# Patient Record
Sex: Female | Born: 1935
Health system: Southern US, Community
[De-identification: ages and names within clinical notes are randomized; demographics above are authoritative.]

## PROBLEM LIST (undated history)

## (undated) DIAGNOSIS — I639 Cerebral infarction, unspecified: Secondary | ICD-10-CM

## (undated) DIAGNOSIS — F0390 Unspecified dementia without behavioral disturbance: Secondary | ICD-10-CM

## (undated) DIAGNOSIS — S92309A Fracture of unspecified metatarsal bone(s), unspecified foot, initial encounter for closed fracture: Secondary | ICD-10-CM

## (undated) DIAGNOSIS — I10 Essential (primary) hypertension: Secondary | ICD-10-CM

## (undated) DIAGNOSIS — I739 Peripheral vascular disease, unspecified: Secondary | ICD-10-CM

## (undated) DIAGNOSIS — IMO0001 Reserved for inherently not codable concepts without codable children: Secondary | ICD-10-CM

## (undated) DIAGNOSIS — I82409 Acute embolism and thrombosis of unspecified deep veins of unspecified lower extremity: Secondary | ICD-10-CM

## (undated) DIAGNOSIS — H269 Unspecified cataract: Secondary | ICD-10-CM

## (undated) DIAGNOSIS — I4891 Unspecified atrial fibrillation: Secondary | ICD-10-CM

## (undated) DIAGNOSIS — H9190 Unspecified hearing loss, unspecified ear: Secondary | ICD-10-CM

## (undated) DIAGNOSIS — I96 Gangrene, not elsewhere classified: Secondary | ICD-10-CM

## (undated) DIAGNOSIS — E785 Hyperlipidemia, unspecified: Secondary | ICD-10-CM

## (undated) DIAGNOSIS — N3941 Urge incontinence: Secondary | ICD-10-CM

## (undated) DIAGNOSIS — G500 Trigeminal neuralgia: Secondary | ICD-10-CM

## (undated) DIAGNOSIS — R569 Unspecified convulsions: Secondary | ICD-10-CM

## (undated) HISTORY — DX: Unspecified convulsions: R56.9

## (undated) HISTORY — DX: Acute embolism and thrombosis of unspecified deep veins of unspecified lower extremity: I82.409

## (undated) HISTORY — DX: Unspecified dementia, unspecified severity, without behavioral disturbance, psychotic disturbance, mood disturbance, and anxiety: F03.90

## (undated) HISTORY — PX: TOE SURGERY: SHX1073

## (undated) HISTORY — DX: Urge incontinence: N39.41

## (undated) HISTORY — DX: Cerebral infarction, unspecified: I63.9

## (undated) HISTORY — DX: Essential (primary) hypertension: I10

## (undated) HISTORY — DX: Hyperlipidemia, unspecified: E78.5

## (undated) HISTORY — DX: Unspecified cataract: H26.9

## (undated) HISTORY — DX: Unspecified hearing loss, unspecified ear: H91.90

## (undated) HISTORY — DX: Trigeminal neuralgia: G50.0

---

## 1998-10-23 ENCOUNTER — Encounter: Payer: Self-pay | Admitting: Emergency Medicine

## 1998-10-23 ENCOUNTER — Emergency Department (HOSPITAL_COMMUNITY): Admission: EM | Admit: 1998-10-23 | Discharge: 1998-10-23 | Payer: Self-pay | Admitting: Emergency Medicine

## 1998-11-25 ENCOUNTER — Emergency Department (HOSPITAL_COMMUNITY): Admission: EM | Admit: 1998-11-25 | Discharge: 1998-11-25 | Payer: Self-pay | Admitting: *Deleted

## 1998-12-01 ENCOUNTER — Encounter: Admission: RE | Admit: 1998-12-01 | Discharge: 1998-12-01 | Payer: Self-pay | Admitting: Family Medicine

## 1998-12-30 ENCOUNTER — Encounter: Admission: RE | Admit: 1998-12-30 | Discharge: 1998-12-30 | Payer: Self-pay | Admitting: Sports Medicine

## 1999-04-24 ENCOUNTER — Emergency Department (HOSPITAL_COMMUNITY): Admission: EM | Admit: 1999-04-24 | Discharge: 1999-04-24 | Payer: Self-pay | Admitting: Emergency Medicine

## 1999-04-24 ENCOUNTER — Encounter: Payer: Self-pay | Admitting: Emergency Medicine

## 1999-06-23 ENCOUNTER — Ambulatory Visit (HOSPITAL_COMMUNITY): Admission: RE | Admit: 1999-06-23 | Discharge: 1999-06-23 | Payer: Self-pay | Admitting: Family Medicine

## 1999-06-23 ENCOUNTER — Encounter: Admission: RE | Admit: 1999-06-23 | Discharge: 1999-06-23 | Payer: Self-pay | Admitting: Family Medicine

## 1999-07-21 ENCOUNTER — Encounter: Admission: RE | Admit: 1999-07-21 | Discharge: 1999-07-21 | Payer: Self-pay | Admitting: Family Medicine

## 1999-08-12 ENCOUNTER — Emergency Department (HOSPITAL_COMMUNITY): Admission: EM | Admit: 1999-08-12 | Discharge: 1999-08-12 | Payer: Self-pay | Admitting: Emergency Medicine

## 1999-10-12 ENCOUNTER — Encounter: Admission: RE | Admit: 1999-10-12 | Discharge: 1999-10-12 | Payer: Self-pay | Admitting: Family Medicine

## 2000-01-13 ENCOUNTER — Emergency Department (HOSPITAL_COMMUNITY): Admission: EM | Admit: 2000-01-13 | Discharge: 2000-01-13 | Payer: Self-pay | Admitting: Emergency Medicine

## 2000-01-13 ENCOUNTER — Encounter: Payer: Self-pay | Admitting: Emergency Medicine

## 2000-01-19 ENCOUNTER — Encounter: Admission: RE | Admit: 2000-01-19 | Discharge: 2000-01-19 | Payer: Self-pay | Admitting: Family Medicine

## 2000-02-03 ENCOUNTER — Encounter: Admission: RE | Admit: 2000-02-03 | Discharge: 2000-02-03 | Payer: Self-pay | Admitting: Family Medicine

## 2000-07-27 ENCOUNTER — Inpatient Hospital Stay (HOSPITAL_COMMUNITY): Admission: EM | Admit: 2000-07-27 | Discharge: 2000-07-29 | Payer: Self-pay | Admitting: Emergency Medicine

## 2001-01-18 ENCOUNTER — Encounter: Admission: RE | Admit: 2001-01-18 | Discharge: 2001-01-18 | Payer: Self-pay | Admitting: Family Medicine

## 2001-07-12 ENCOUNTER — Encounter: Admission: RE | Admit: 2001-07-12 | Discharge: 2001-07-12 | Payer: Self-pay | Admitting: Family Medicine

## 2001-08-17 ENCOUNTER — Encounter: Admission: RE | Admit: 2001-08-17 | Discharge: 2001-08-17 | Payer: Self-pay | Admitting: Family Medicine

## 2001-08-23 ENCOUNTER — Encounter: Payer: Self-pay | Admitting: Family Medicine

## 2001-08-23 ENCOUNTER — Inpatient Hospital Stay (HOSPITAL_COMMUNITY): Admission: EM | Admit: 2001-08-23 | Discharge: 2001-08-25 | Payer: Self-pay

## 2001-08-23 ENCOUNTER — Encounter: Payer: Self-pay | Admitting: Emergency Medicine

## 2001-10-05 ENCOUNTER — Encounter: Admission: RE | Admit: 2001-10-05 | Discharge: 2001-10-05 | Payer: Self-pay | Admitting: Family Medicine

## 2001-12-19 ENCOUNTER — Encounter: Admission: RE | Admit: 2001-12-19 | Discharge: 2001-12-19 | Payer: Self-pay | Admitting: Family Medicine

## 2001-12-28 ENCOUNTER — Encounter: Admission: RE | Admit: 2001-12-28 | Discharge: 2001-12-28 | Payer: Self-pay | Admitting: Family Medicine

## 2002-01-22 ENCOUNTER — Encounter: Admission: RE | Admit: 2002-01-22 | Discharge: 2002-01-22 | Payer: Self-pay | Admitting: Family Medicine

## 2002-06-26 ENCOUNTER — Encounter: Admission: RE | Admit: 2002-06-26 | Discharge: 2002-06-26 | Payer: Self-pay | Admitting: Family Medicine

## 2002-07-25 ENCOUNTER — Encounter: Admission: RE | Admit: 2002-07-25 | Discharge: 2002-07-25 | Payer: Self-pay | Admitting: Family Medicine

## 2002-09-12 ENCOUNTER — Encounter: Admission: RE | Admit: 2002-09-12 | Discharge: 2002-09-12 | Payer: Self-pay | Admitting: Family Medicine

## 2002-10-05 ENCOUNTER — Encounter: Admission: RE | Admit: 2002-10-05 | Discharge: 2002-10-05 | Payer: Self-pay | Admitting: Family Medicine

## 2002-11-07 ENCOUNTER — Encounter: Admission: RE | Admit: 2002-11-07 | Discharge: 2002-11-07 | Payer: Self-pay | Admitting: Orthopedic Surgery

## 2002-11-07 ENCOUNTER — Encounter: Payer: Self-pay | Admitting: Orthopedic Surgery

## 2002-11-08 ENCOUNTER — Encounter (INDEPENDENT_AMBULATORY_CARE_PROVIDER_SITE_OTHER): Payer: Self-pay | Admitting: *Deleted

## 2002-11-08 ENCOUNTER — Ambulatory Visit (HOSPITAL_BASED_OUTPATIENT_CLINIC_OR_DEPARTMENT_OTHER): Admission: RE | Admit: 2002-11-08 | Discharge: 2002-11-08 | Payer: Self-pay | Admitting: Orthopedic Surgery

## 2002-12-20 ENCOUNTER — Encounter: Admission: RE | Admit: 2002-12-20 | Discharge: 2002-12-20 | Payer: Self-pay | Admitting: Sports Medicine

## 2003-02-11 ENCOUNTER — Encounter: Admission: RE | Admit: 2003-02-11 | Discharge: 2003-02-11 | Payer: Self-pay | Admitting: Sports Medicine

## 2003-02-20 ENCOUNTER — Encounter: Admission: RE | Admit: 2003-02-20 | Discharge: 2003-02-20 | Payer: Self-pay | Admitting: Sports Medicine

## 2003-02-20 ENCOUNTER — Encounter: Payer: Self-pay | Admitting: Sports Medicine

## 2003-02-25 ENCOUNTER — Encounter: Admission: RE | Admit: 2003-02-25 | Discharge: 2003-02-25 | Payer: Self-pay | Admitting: Family Medicine

## 2003-07-12 ENCOUNTER — Encounter: Admission: RE | Admit: 2003-07-12 | Discharge: 2003-07-12 | Payer: Self-pay | Admitting: Family Medicine

## 2003-07-24 ENCOUNTER — Encounter: Admission: RE | Admit: 2003-07-24 | Discharge: 2003-07-24 | Payer: Self-pay | Admitting: Family Medicine

## 2003-11-27 ENCOUNTER — Encounter: Admission: RE | Admit: 2003-11-27 | Discharge: 2003-11-27 | Payer: Self-pay | Admitting: Sports Medicine

## 2003-11-27 ENCOUNTER — Ambulatory Visit (HOSPITAL_COMMUNITY): Admission: RE | Admit: 2003-11-27 | Discharge: 2003-11-27 | Payer: Self-pay | Admitting: Family Medicine

## 2003-11-27 ENCOUNTER — Encounter: Admission: RE | Admit: 2003-11-27 | Discharge: 2003-11-27 | Payer: Self-pay | Admitting: Family Medicine

## 2004-03-10 ENCOUNTER — Encounter: Admission: RE | Admit: 2004-03-10 | Discharge: 2004-03-10 | Payer: Self-pay | Admitting: Sports Medicine

## 2004-03-24 ENCOUNTER — Inpatient Hospital Stay (HOSPITAL_COMMUNITY): Admission: EM | Admit: 2004-03-24 | Discharge: 2004-03-28 | Payer: Self-pay | Admitting: Emergency Medicine

## 2004-08-06 ENCOUNTER — Ambulatory Visit: Payer: Self-pay | Admitting: Family Medicine

## 2004-12-11 ENCOUNTER — Ambulatory Visit: Payer: Self-pay | Admitting: Family Medicine

## 2005-01-22 ENCOUNTER — Ambulatory Visit: Payer: Self-pay | Admitting: Cardiology

## 2005-01-22 ENCOUNTER — Inpatient Hospital Stay (HOSPITAL_COMMUNITY): Admission: EM | Admit: 2005-01-22 | Discharge: 2005-01-26 | Payer: Self-pay | Admitting: Emergency Medicine

## 2005-01-22 ENCOUNTER — Encounter: Payer: Self-pay | Admitting: Cardiology

## 2005-01-22 ENCOUNTER — Ambulatory Visit: Payer: Self-pay | Admitting: Family Medicine

## 2005-02-11 ENCOUNTER — Ambulatory Visit: Payer: Self-pay | Admitting: Sports Medicine

## 2005-04-26 ENCOUNTER — Ambulatory Visit: Payer: Self-pay | Admitting: Family Medicine

## 2005-05-20 ENCOUNTER — Emergency Department (HOSPITAL_COMMUNITY): Admission: EM | Admit: 2005-05-20 | Discharge: 2005-05-20 | Payer: Self-pay | Admitting: Emergency Medicine

## 2005-08-20 ENCOUNTER — Ambulatory Visit: Payer: Self-pay | Admitting: Family Medicine

## 2006-06-20 ENCOUNTER — Inpatient Hospital Stay (HOSPITAL_COMMUNITY): Admission: EM | Admit: 2006-06-20 | Discharge: 2006-06-27 | Payer: Self-pay | Admitting: Emergency Medicine

## 2006-06-20 ENCOUNTER — Ambulatory Visit: Payer: Self-pay | Admitting: Sports Medicine

## 2006-07-13 ENCOUNTER — Emergency Department (HOSPITAL_COMMUNITY): Admission: EM | Admit: 2006-07-13 | Discharge: 2006-07-13 | Payer: Self-pay | Admitting: Emergency Medicine

## 2006-08-26 ENCOUNTER — Ambulatory Visit: Payer: Self-pay | Admitting: Family Medicine

## 2006-08-26 ENCOUNTER — Inpatient Hospital Stay (HOSPITAL_COMMUNITY): Admission: EM | Admit: 2006-08-26 | Discharge: 2006-08-29 | Payer: Self-pay | Admitting: Emergency Medicine

## 2006-09-23 ENCOUNTER — Ambulatory Visit: Payer: Self-pay | Admitting: Family Medicine

## 2006-11-01 ENCOUNTER — Emergency Department (HOSPITAL_COMMUNITY): Admission: EM | Admit: 2006-11-01 | Discharge: 2006-11-01 | Payer: Self-pay | Admitting: Emergency Medicine

## 2006-11-03 DIAGNOSIS — H9190 Unspecified hearing loss, unspecified ear: Secondary | ICD-10-CM | POA: Insufficient documentation

## 2006-11-03 DIAGNOSIS — G500 Trigeminal neuralgia: Secondary | ICD-10-CM

## 2006-11-03 DIAGNOSIS — N3941 Urge incontinence: Secondary | ICD-10-CM

## 2006-11-03 DIAGNOSIS — H9319 Tinnitus, unspecified ear: Secondary | ICD-10-CM | POA: Insufficient documentation

## 2006-11-03 DIAGNOSIS — H269 Unspecified cataract: Secondary | ICD-10-CM

## 2006-11-03 DIAGNOSIS — E7800 Pure hypercholesterolemia, unspecified: Secondary | ICD-10-CM

## 2006-11-03 DIAGNOSIS — K219 Gastro-esophageal reflux disease without esophagitis: Secondary | ICD-10-CM | POA: Insufficient documentation

## 2006-11-03 DIAGNOSIS — F1010 Alcohol abuse, uncomplicated: Secondary | ICD-10-CM

## 2006-11-03 DIAGNOSIS — K5900 Constipation, unspecified: Secondary | ICD-10-CM

## 2006-11-03 DIAGNOSIS — R569 Unspecified convulsions: Secondary | ICD-10-CM | POA: Insufficient documentation

## 2006-11-03 DIAGNOSIS — I6789 Other cerebrovascular disease: Secondary | ICD-10-CM

## 2006-11-03 DIAGNOSIS — I10 Essential (primary) hypertension: Secondary | ICD-10-CM | POA: Insufficient documentation

## 2006-11-03 DIAGNOSIS — F068 Other specified mental disorders due to known physiological condition: Secondary | ICD-10-CM | POA: Insufficient documentation

## 2006-11-03 HISTORY — DX: Unspecified hearing loss, unspecified ear: H91.90

## 2006-11-03 HISTORY — DX: Unspecified cataract: H26.9

## 2006-11-03 HISTORY — DX: Urge incontinence: N39.41

## 2006-11-03 HISTORY — DX: Trigeminal neuralgia: G50.0

## 2006-12-06 ENCOUNTER — Emergency Department (HOSPITAL_COMMUNITY): Admission: EM | Admit: 2006-12-06 | Discharge: 2006-12-06 | Payer: Self-pay | Admitting: Emergency Medicine

## 2006-12-12 ENCOUNTER — Telehealth: Payer: Self-pay | Admitting: *Deleted

## 2006-12-13 ENCOUNTER — Ambulatory Visit: Payer: Self-pay | Admitting: Family Medicine

## 2006-12-13 ENCOUNTER — Encounter: Admission: RE | Admit: 2006-12-13 | Discharge: 2006-12-13 | Payer: Self-pay | Admitting: Advanced Practice Midwife

## 2007-01-11 ENCOUNTER — Telehealth: Payer: Self-pay | Admitting: *Deleted

## 2007-01-11 ENCOUNTER — Emergency Department (HOSPITAL_COMMUNITY): Admission: EM | Admit: 2007-01-11 | Discharge: 2007-01-11 | Payer: Self-pay | Admitting: Emergency Medicine

## 2007-06-23 ENCOUNTER — Ambulatory Visit: Payer: Self-pay | Admitting: Internal Medicine

## 2007-06-23 ENCOUNTER — Encounter (INDEPENDENT_AMBULATORY_CARE_PROVIDER_SITE_OTHER): Payer: Self-pay | Admitting: Family Medicine

## 2007-06-23 LAB — CONVERTED CEMR LAB
ALT: 10 units/L (ref 0–35)
BUN: 37 mg/dL — ABNORMAL HIGH (ref 6–23)
Chloride: 105 meq/L (ref 96–112)
Creatinine, Ser: 1.32 mg/dL — ABNORMAL HIGH (ref 0.40–1.20)
Direct LDL: 85 mg/dL
Ferritin: 316 ng/mL — ABNORMAL HIGH (ref 10–291)
Hemoglobin: 12.5 g/dL (ref 12.0–15.0)
MCHC: 32.3 g/dL (ref 30.0–36.0)
MCV: 93.9 fL (ref 78.0–100.0)
Platelets: 176 10*3/uL (ref 150–400)
Potassium: 3.5 meq/L (ref 3.5–5.3)
Sodium: 143 meq/L (ref 135–145)
Total Protein: 7.1 g/dL (ref 6.0–8.3)
Vitamin B-12: 288 pg/mL (ref 211–911)
WBC: 3.6 10*3/uL — ABNORMAL LOW (ref 4.0–10.5)

## 2007-07-11 ENCOUNTER — Emergency Department (HOSPITAL_COMMUNITY): Admission: EM | Admit: 2007-07-11 | Discharge: 2007-07-11 | Payer: Self-pay | Admitting: Emergency Medicine

## 2007-07-13 ENCOUNTER — Telehealth: Payer: Self-pay | Admitting: *Deleted

## 2007-07-20 ENCOUNTER — Encounter (INDEPENDENT_AMBULATORY_CARE_PROVIDER_SITE_OTHER): Payer: Self-pay | Admitting: *Deleted

## 2007-07-26 ENCOUNTER — Ambulatory Visit: Payer: Self-pay | Admitting: Family Medicine

## 2007-08-21 ENCOUNTER — Ambulatory Visit: Payer: Self-pay | Admitting: Sports Medicine

## 2007-08-21 ENCOUNTER — Encounter (INDEPENDENT_AMBULATORY_CARE_PROVIDER_SITE_OTHER): Payer: Self-pay | Admitting: Family Medicine

## 2007-08-23 LAB — CONVERTED CEMR LAB: Carbamazepine Lvl: <0.3 ug/mL — ABNORMAL LOW (ref 4.0–12.0)

## 2007-10-04 ENCOUNTER — Encounter (INDEPENDENT_AMBULATORY_CARE_PROVIDER_SITE_OTHER): Payer: Self-pay | Admitting: Hospitalist

## 2007-10-04 ENCOUNTER — Ambulatory Visit (HOSPITAL_COMMUNITY): Admission: RE | Admit: 2007-10-04 | Discharge: 2007-10-04 | Payer: Self-pay | Admitting: Hospitalist

## 2007-10-04 ENCOUNTER — Ambulatory Visit: Payer: Self-pay | Admitting: Vascular Surgery

## 2007-12-18 ENCOUNTER — Emergency Department (HOSPITAL_COMMUNITY): Admission: EM | Admit: 2007-12-18 | Discharge: 2007-12-18 | Payer: Self-pay | Admitting: Emergency Medicine

## 2007-12-28 ENCOUNTER — Telehealth (INDEPENDENT_AMBULATORY_CARE_PROVIDER_SITE_OTHER): Payer: Self-pay | Admitting: Family Medicine

## 2008-05-02 ENCOUNTER — Encounter (INDEPENDENT_AMBULATORY_CARE_PROVIDER_SITE_OTHER): Payer: Self-pay | Admitting: *Deleted

## 2008-07-02 ENCOUNTER — Encounter: Payer: Self-pay | Admitting: *Deleted

## 2008-07-29 ENCOUNTER — Telehealth: Payer: Self-pay | Admitting: Family Medicine

## 2008-08-06 ENCOUNTER — Ambulatory Visit: Payer: Self-pay | Admitting: Family Medicine

## 2008-08-06 DIAGNOSIS — M21619 Bunion of unspecified foot: Secondary | ICD-10-CM

## 2008-08-29 ENCOUNTER — Ambulatory Visit: Payer: Self-pay | Admitting: Family Medicine

## 2008-08-29 ENCOUNTER — Telehealth: Payer: Self-pay | Admitting: Family Medicine

## 2008-08-29 ENCOUNTER — Inpatient Hospital Stay (HOSPITAL_COMMUNITY): Admission: EM | Admit: 2008-08-29 | Discharge: 2008-08-31 | Payer: Self-pay | Admitting: Emergency Medicine

## 2008-08-30 ENCOUNTER — Encounter: Payer: Self-pay | Admitting: Family Medicine

## 2008-08-30 DIAGNOSIS — N259 Disorder resulting from impaired renal tubular function, unspecified: Secondary | ICD-10-CM | POA: Insufficient documentation

## 2008-08-31 ENCOUNTER — Encounter (INDEPENDENT_AMBULATORY_CARE_PROVIDER_SITE_OTHER): Payer: Self-pay | Admitting: Family Medicine

## 2008-09-09 ENCOUNTER — Encounter: Payer: Self-pay | Admitting: *Deleted

## 2008-09-12 ENCOUNTER — Encounter: Payer: Self-pay | Admitting: Family Medicine

## 2008-09-12 ENCOUNTER — Ambulatory Visit: Payer: Self-pay | Admitting: Family Medicine

## 2008-09-12 ENCOUNTER — Encounter: Payer: Self-pay | Admitting: *Deleted

## 2008-09-13 LAB — CONVERTED CEMR LAB
BUN: 30 mg/dL — ABNORMAL HIGH (ref 6–23)
Calcium: 8.6 mg/dL (ref 8.4–10.5)
Chloride: 108 meq/L (ref 96–112)
Potassium: 4.3 meq/L (ref 3.5–5.3)
Sodium: 144 meq/L (ref 135–145)

## 2008-09-25 ENCOUNTER — Encounter: Payer: Self-pay | Admitting: Family Medicine

## 2008-12-13 ENCOUNTER — Ambulatory Visit: Payer: Self-pay | Admitting: Family Medicine

## 2008-12-13 DIAGNOSIS — M549 Dorsalgia, unspecified: Secondary | ICD-10-CM | POA: Insufficient documentation

## 2009-05-19 ENCOUNTER — Ambulatory Visit: Payer: Self-pay | Admitting: Family Medicine

## 2009-11-14 ENCOUNTER — Ambulatory Visit (HOSPITAL_COMMUNITY): Admission: RE | Admit: 2009-11-14 | Discharge: 2009-11-14 | Payer: Self-pay | Admitting: Family Medicine

## 2009-11-14 ENCOUNTER — Ambulatory Visit: Payer: Self-pay | Admitting: Family Medicine

## 2009-11-14 ENCOUNTER — Encounter: Payer: Self-pay | Admitting: Family Medicine

## 2009-11-15 ENCOUNTER — Encounter: Payer: Self-pay | Admitting: Family Medicine

## 2009-11-15 ENCOUNTER — Telehealth: Payer: Self-pay | Admitting: Family Medicine

## 2009-11-16 ENCOUNTER — Ambulatory Visit: Payer: Self-pay | Admitting: Family Medicine

## 2009-11-17 ENCOUNTER — Encounter: Payer: Self-pay | Admitting: Family Medicine

## 2009-12-10 ENCOUNTER — Ambulatory Visit: Payer: Self-pay | Admitting: Family Medicine

## 2009-12-12 ENCOUNTER — Encounter: Payer: Self-pay | Admitting: Family Medicine

## 2009-12-18 ENCOUNTER — Encounter: Payer: Self-pay | Admitting: Family Medicine

## 2009-12-19 ENCOUNTER — Encounter: Payer: Self-pay | Admitting: Family Medicine

## 2010-05-25 ENCOUNTER — Ambulatory Visit: Payer: Self-pay | Admitting: Family Medicine

## 2010-05-25 ENCOUNTER — Inpatient Hospital Stay (HOSPITAL_COMMUNITY): Admission: EM | Admit: 2010-05-25 | Discharge: 2010-05-27 | Payer: Self-pay | Admitting: Emergency Medicine

## 2010-05-25 ENCOUNTER — Encounter: Payer: Self-pay | Admitting: Family Medicine

## 2010-08-13 ENCOUNTER — Inpatient Hospital Stay (HOSPITAL_COMMUNITY): Admission: EM | Admit: 2010-08-13 | Discharge: 2009-11-18 | Payer: Self-pay | Admitting: Emergency Medicine

## 2010-08-27 ENCOUNTER — Observation Stay (HOSPITAL_COMMUNITY)
Admission: EM | Admit: 2010-08-27 | Discharge: 2010-08-29 | Payer: Self-pay | Source: Home / Self Care | Attending: Family Medicine | Admitting: Family Medicine

## 2010-10-06 NOTE — Assessment & Plan Note (Signed)
Summary: Hospital H &P   Vital Signs:  Patient profile:   77 year old female O2 Sat:      97 % on Room air Temp:     98.3 degrees F Pulse rate:   108 / minute Resp:     16 per minute BP supine:   136 / 107  O2 Flow:  Room air  Primary Care Provider:  Bobby Rumpf  MD  CC:  altered mental status.  History of Present Illness: Pt with moderate dementia.  Had seizure on 3/10 and had a head laceration.  Had another seizure the morning of 3/11.  Seen at [facility] for the head laceration.  Laceration was sutured.  Pt was noted to be more confused than usual at that time.  Sent for CT head, which showed no acute findings.  However, received outside call from granddaughter that she was agitated and combative at home since yesterday.  She is frequently altered after seizures, but this episode lasted much longer than normal.  Family attempted to bring her to the ER, but pt became combative and tried to get out of the car; so they called 911.  Unfortunately, family left before I could speak with them in person.  This info is gathered from ER report and from my phone conversation earlier in the day with the family.  In ER, received ativan X 1 mg, rocehpin X 1 g.  Current Problems (verified): 1)  Head Injury, Superficial  (ICD-873.8) 2)  Back Pain  (ICD-724.5) 3)  Renal Insufficiency  (ICD-588.9) 4)  H/F Altered Mental Status  (ICD-780.97) 5)  Bunions, Bilateral  (ICD-727.1) 6)  Trigeminal Neuralgia  (ICD-350.1) 7)  Tinnitus  (ICD-388.30) 8)  Incontinence, Urge  (ICD-788.31) 9)  Hypertension, Benign Systemic  (ICD-401.1) 10)  Hypercholesterolemia  (ICD-272.0) 11)  Hearing Loss Nos or Deafness  (ICD-389.9) 12)  Gastroesophageal Reflux, No Esophagitis  (ICD-530.81) 13)  Dementia, Not Specified  (ICD-294.8) 14)  Cva  (ICD-436) 15)  Convulsions, Seizures, Nos  (ICD-780.39) 16)  Constipation  (ICD-564.0) 17)  Cataract  (ICD-366.9) 18)  Alcohol Abuse, Unspecified  (ICD-305.00)  Current Medications  (verified): 1)  Aggrenox 25-200 Mg Cp12 (Aspirin-Dipyridamole) .... Take 1 Capsule By Mouth Twice A Day 2)  Lipitor 80 Mg Tabs (Atorvastatin Calcium) .... Take 1 Tablet By Mouth Once A Day 3)  Namenda 5 Mg Tabs (Memantine Hcl) .... Take 1 Tablet By Mouth Once A Day 4)  Tegretol 200 Mg Tabs (Carbamazepine) .... Take 1 Tablet By Mouth Twice A Day. The Neurologist Will Prescribe Seizure Medicine in The Future 5)  Toprol Xl 50 Mg  Tb24 (Metoprolol Succinate) .Marland Kitchen.. 1 Tab By Mouth Daily 6)  Tramadol-Acetaminophen 37.5-325 Mg Tabs (Tramadol-Acetaminophen) .... One Every 4-6 Hours As Needed For Pain. 7)  Hydrochlorothiazide 25 Mg Tabs (Hydrochlorothiazide) .... One Tab By Mouth Qday  Allergies (verified): No Known Drug Allergies  Past History:  Past Medical History: Last updated: 11/03/2006 dilated CARDIOMYOPATHY with systolic dysfunction, FB in right ear -- no need to remove, Folstein 18/30 -- Neurologist checked 26/30?, [facility]  23/30 on 2/31/01, Hx Syncope Hosp 2001, and 08/2001, R trigeminal neuralgia 6/03  Past Surgical History: Last updated: 11/03/2006 Echo--dilated cardiomyopathy +wall mot abnml - 07/07/2000, Stress Cardiolite-no evidence of ischemia 11/06/99 - 02/05/2000, TMJ Surgery ? Unknown when -  Family History: Last updated: 11/03/2006 history of hypertension, No cancer or DM  Social History: Last updated: 11/03/2006 Lives with daughter Anaysia Germer; Worked for [employer],  now retired; hx EtOH abuse; No driving  Risk Factors: Smoking Status: never (12/13/2006)  Family History: Reviewed history from 11/03/2006 and no changes required. history of hypertension, No cancer or DM  Social History: Reviewed history from 11/03/2006 and no changes required. Lives with daughter Manmeet Arzola; Worked for [employer], now retired; hx EtOH abuse; No driving  Physical Exam  General:  very somnolent, laying in ER.  awakens to sternal  rub.  mildy agitated when she wakes up Head:  2 cm lac on forehead with sutures in place, healing well.   Eyes:  pupils reactive, but  mild anisicoria  Mouth:  could not visualize oropharynx adequately, as pt is not cooperating Lungs:  Normal respiratory effort, chest expands symmetrically. Lungs are clear to auscultation, no crackles or wheezes. Heart:  normal rate, regular rhythm, no murmur, no gallop, and no rub.  Abdomen:  soft, non-tender, no distention, no masses, and no guarding.   Msk:  moving all extremities Extremities:  no LE edema  right 5th toe is grossly abnormal.  not sure if partial amputation or just a shortened toe.  Looks to be extensive fungal infection of this nail.  Less extensive onchomychosis of other toes Neurologic:  somnolent; awakens to sternal rub.  cannot follow commands.  agitated when she awakens.  no facial droop or obvious focal weakness.  pupils reactive, but mild anisicoria Skin:  per RN skin check did not reveal any wounds or lesions beyond what is described in exam as above Additional Exam:  WBC 4.8 hgb 13.2 hct 39.6 plt 146 diff WNL Na 139 K+ 3.5 Cl 101 bicarb 27 gluc 108 BUN 19 Cr 1.15 Ca 9.1 GFR 55  u/a sg 1.012, small hgb, positive nitrite, neg LE, rare epi, 0-2 WBCs, many bact  CXR:  no acute cp findings.  mild stable cardiomegaly  EKG:  NSR with rate of 92.  low voltage.  no significant ST changes     Impression & Recommendations:  Problem # 1:  Hosp for ALTERED MENTAL STATUS (ICD-780.97) Assessment Deteriorated caused by possible uti vs head injury from seizure (although CT negative) vs worsening dementia.  This picture is complicated by her having been given ativan.  Had head CT yesterday, but in pt on aggrenox with hx of head injury, think it is prudent to recheck CT to look for slow bleed.  Will tx possible uti.    Will take a somewhat minimalist approach in terms of additional AMS work-up.  Will get head CT, but in this 77YO  with dementia, think that cardiac enzymes or additional labs not needed at this time.  If she does not improve with antibiotics, consider proceeding with more extensive work up.  bedside swallow.  npo unti she passes.    think she has been slowly declining.  Dr. Wallene Huh mentions possibility of placement in last note.  Will see how her mental status improves and consider options  Problem # 2:  BACTERIURIA (ICD-599.0) Assessment: New perhaps this is cause of her AMS.  Will tx with rocephin (already received one dose in ER).  urine has been sent for culture  Problem # 3:  HYPERTENSION, BENIGN SYSTEMIC (ICD-401.1) Assessment: Unchanged high when she first arrived at ER, but normal on recheck.  no by mouth meds until more alert.  labetalol as needed until home meds can be restarted Her updated medication list for this problem includes:    Toprol Xl 50 Mg Tb24 (Metoprolol succinate) .Marland Kitchen... 1 tab  by mouth daily    Hydrochlorothiazide 25 Mg Tabs (Hydrochlorothiazide) ..... One tab by mouth qday  Problem # 4:  RENAL INSUFFICIENCY (ICD-588.9) at baseline.  avoid nephrotoxins  Problem # 5:  HYPERCHOLESTEROLEMIA (ICD-272.0) Assessment: Unchanged hold until taking po Her updated medication list for this problem includes:    Lipitor 80 Mg Tabs (Atorvastatin calcium) .Marland Kitchen... Take 1 tablet by mouth once a day  Problem # 6:  DEMENTIA, NOT SPECIFIED (ICD-294.8) Assessment: Unchanged hold namenda for now until taking po.  may want to reconsider use of  this if she is in the later stages of dementia  Problem # 7:  CVA (ICD-436) Assessment: Unchanged hold aggrenox for now due to bleed risk after fall  Problem # 8:  CONVULSIONS, SEIZURES, NOS (ICD-780.39) Assessment: Unchanged no by mouth until more alert.  was therapeutic in the office yesterday.  spoke with pharmacy.  OK to hold for now since was therapeutic (half-life is long).  will consider IV options tomorrow if she fails her bedside swallow Her  updated medication list for this problem includes:    Tegretol 200 Mg Tabs (Carbamazepine) .Marland Kitchen... Take 1 tablet by mouth twice a day. the neurologist will prescribe seizure medicine in the future  Problem # 9:  ALCOHOL ABUSE, UNSPECIFIED (ICD-305.00) Assessment: Unchanged neuro checks q 4 hours.  not aware of any history of withdrawal  Problem # 10:  prophylaxis no heparin for now.    Problem # 11:  fengi npo for now.  bedside swallow.  ivf at 150 cc/hour  Complete Medication List: 1)  Aggrenox 25-200 Mg Cp12 (Aspirin-dipyridamole) .... Take 1 capsule by mouth twice a day 2)  Lipitor 80 Mg Tabs (Atorvastatin calcium) .... Take 1 tablet by mouth once a day 3)  Namenda 5 Mg Tabs (Memantine hcl) .... Take 1 tablet by mouth once a day 4)  Tegretol 200 Mg Tabs (Carbamazepine) .... Take 1 tablet by mouth twice a day. the neurologist will prescribe seizure medicine in the future 5)  Toprol Xl 50 Mg Tb24 (Metoprolol succinate) .Marland Kitchen.. 1 tab by mouth daily 6)  Tramadol-acetaminophen 37.5-325 Mg Tabs (Tramadol-acetaminophen) .... One every 4-6 hours as needed for pain. 7)  Hydrochlorothiazide 25 Mg Tabs (Hydrochlorothiazide) .... One tab by mouth qday

## 2010-10-06 NOTE — Miscellaneous (Signed)
Summary: Med refill and Referral  Clinical Lists Changes  Pts dgt is requesting her namenda to be refilled.  Also ask if we can refer her to a podiatrist because her toes are hurting her.  Advised that PCP may want to see her before referral, but I would send him a message................................................ Delora Fuel December 18, 2009 3:31 PM  calling again about [facility] also about the podiatrist De Nurse  December 22, 2009 9:30 AM  Refilled Namenda via fax paper refill. Will want to see her before referral.  Bobby Rumpf  MD  December 22, 2009 11:14 AM

## 2010-10-06 NOTE — Progress Notes (Signed)
  Phone Note Call from Patient   Summary of Call: Cal from granddaughter.  pt seen in clinic yesterday after a fall/laceration following a seizure.  Had head CT with no acute changes.  tegretol level normal.  is agitated and refuses to go to the hospital for evaluation.  GD would like to give her something to calm down.  I advised her to call EMS and bring her to ER for evaluation Initial call taken by: Asher Muir MD,  November 15, 2009 4:37 PM

## 2010-10-06 NOTE — Miscellaneous (Signed)
Summary: FMLA  pts daughter dropped off form to be completed, placed on triage desk for any clinical info to be completed. Knox Royalty  December 12, 2009 3:10 PM    fmla forms to pcp.Golden Circle RN  December 12, 2009 3:14 PM  form filled and in to be called box.  Bobby Rumpf  MD  December 22, 2009 11:07 AM   called to inform [facility] is ready De Nurse  December 24, 2009 11:01 AM

## 2010-10-06 NOTE — Assessment & Plan Note (Signed)
Summary: pt fell and hit head,tcb   Vital Signs:  Patient profile:   77 year old female Height:      63 inches Weight:      135 pounds BMI:     24.00 Temp:     98.5 degrees F BP sitting:   198 / 131  Vitals Entered By: Lillia Pauls CMA (November 14, 2009 3:32 PM)  Primary Care Provider:  Bobby Rumpf  MD  CC:  seizure and hit head .  History of Present Illness: 1) Seizure: Patient with known seizure disorder on tegretol w/ seizure last night lasting for 5 minutes w/o intervention, fell out of bed and hit head, sustaining deep flap laceration on forehead 2 cm long. EMS was called to home, patient's head was bandaged, patient was advised to follow up with PCP. Second seziure on day of appointment at 8 AM, lasted for 5 minutes. Both seizures with generalized tonic clonic activity per daughter. Patient w/ history of moderate dementia, daughter helps her with her medications in the morning, but not sure if she is taking her evening medications. Patient displays post-ictal confusion for hours after her seizures per daughter. Last seizure was in September 2010.   2) Head trauma: Sustained injury as above. Gauze pad placed on wound by EMS. Patient on Aggrenox. No bleeding currently, but bleeds immediately when gauze is touched. No focal neurological signs, but patient is somewhat more confused than her baseline (daughter states that she is like this for hours after her seizures).   3) HTN: Did not take meds today. On metoprolol. Daughter states that she gives her this every day. Had been on Norvasc, lisinopril, HCTZ as well but these were discontinued after her last hospitalization as she was normotensive during her hospital stay. Last BP  ~ 190/120 in September (off metoprolol).  4) Dementia: Last MMSE = 18, in 2010. Granddaughter states that her dementia seems to be stable. Patient able to clean, wash dishes, perform some ADLs, has started cooking (mostly hot dogs) for herself.  Forgetful with regards  to short term and remote memory. Granddaughter, daughter provide care as best as they can, but Ms. Pizzuto goes a large portion of the day unsupervised. On Namenda. Compounded by bilateral hearing loss.      Current Medications (verified): 1)  Aggrenox 25-200 Mg Cp12 (Aspirin-Dipyridamole) .... Take 1 Capsule By Mouth Twice A Day 2)  Lipitor 80 Mg Tabs (Atorvastatin Calcium) .... Take 1 Tablet By Mouth Once A Day 3)  Namenda 5 Mg Tabs (Memantine Hcl) .... Take 1 Tablet By Mouth Once A Day 4)  Tegretol 200 Mg Tabs (Carbamazepine) .... Take 1 Tablet By Mouth Twice A Day. The Neurologist Will Prescribe Seizure Medicine in The Future 5)  Toprol Xl 50 Mg  Tb24 (Metoprolol Succinate) .Marland Kitchen.. 1 Tab By Mouth Daily 6)  Tramadol-Acetaminophen 37.5-325 Mg Tabs (Tramadol-Acetaminophen) .... One Every 4-6 Hours As Needed For Pain. 7)  Hydrochlorothiazide 25 Mg Tabs (Hydrochlorothiazide) .... One Tab By Mouth Qday  Allergies (verified): No Known Drug Allergies  Review of Systems       Denies chest pain, dyspnea, weakness, numbness,   Physical Exam  General:  alert, confused hypertensive NAD  Head:  2 cm flap laceration in center of forehead, bleeds profusely when gauze is removed.  Eyes:  vision grossly intact, PERRL, EOMI, arcus senilis  Ears:  canals clear w/o blood  Nose:  no external deformity  Mouth:  moist membranes  Neck:  supple, full ROM  Lungs:  normal respiratory effort and normal breath sounds.   Heart:  normal rate, regular rhythm, no murmur, no gallop, and no rub.  normal PMI Msk:  Able to bend over a touch her toes slowly, with only slight backward extension ROM noted. Lower extremitiy strength 4/5 bilaterally.  Very poor posture.  Pulses:  1+ radials  Extremities:  no LE edema  Neurologic:  Alert, oriented to self, seems somewhat more confused and slowed than her baseline, pleasant, difficulty hearing and following commands. cranial nerves II-XII intact and sensation intact to light  touch. walks with cane  Additional Exam:  Laceration repair: 2 cm vertical flap laceration center of forehead. Consent obtained from daughter (unable to obtain consent from patient secondary to moderate dementia) after risks and benefits of procedure explained. Wound irrigated with sterile saline. Anesthesia with 3 cc 2% lidocaine with epinepherine. Wound initially with more bleeding than expected (patient on Aggrenox), relieved by sustained pressure. 5 interrupted sutures with 3.0 nylon placed w/ excellent hemostasis. Bacitracin placed on wound w/o dressing. Advised care with antibiotic ointment daily. Patient tolerated procedure well. Follow up in 7 days for suture removal.    Impression & Recommendations:  Problem # 1:  HEAD INJURY, SUPERFICIAL (ICD-873.8) Assessment New Laceration repaired as per procedure note. Will obtain CT head w/o contrast to assess for intracranial bleed (especially given patient age, on Aggrenox.) Will hold Aggrenox for 5 days. Assuming CT head does not show bleed, will have patient follow up in 7 days for suture removal.  WOULD GIVE TETANUS BOOSTER AT NEXT APPOINTMENT.   Orders: CT without Contrast (CT w/o contrast) [facility]- Est  Level 4 (01027)  Problem # 2:  CONVULSIONS, SEIZURES, NOS (ICD-780.39) Assessment: Deteriorated Will check Tegretol level. Suspect that Ms. Rankin is not taking her PM dose of tegretol. Advised close follow up with [facility] regarding this issue. CT head as above. Follow up one week.  Her updated medication list for this problem includes:    Tegretol 200 Mg Tabs (Carbamazepine) .Marland Kitchen... Take 1 tablet by mouth twice a day. the neurologist will prescribe seizure medicine in the future  Orders: Miscellaneous Lab Charge-[facility] (25366) [facility]- Est  Level 4 (44034)  Problem # 3:  HYPERTENSION, BENIGN SYSTEMIC (ICD-401.1) Assessment: Deteriorated  Will restart HCTZ at 25 mg. Will check BMET at next appointment. Advised imoprtance of close  follow up, imoprtance of supervision with medicatons. Would add Norvasc next if not controlled.  Her updated medication list for this problem includes:    Toprol Xl 50 Mg Tb24 (Metoprolol succinate) .Marland Kitchen... 1 tab by mouth daily    Hydrochlorothiazide 25 Mg Tabs (Hydrochlorothiazide) ..... One tab by mouth qday  BP today: 198/131 Prior BP: 196/122 (05/19/2009)  Labs Reviewed: K+: 4.3 (09/12/2008) Creat: : 1.06 (09/12/2008)     Orders: [facility]- Est  Level 4 (74259)  Problem # 4:  DEMENTIA, NOT SPECIFIED (ICD-294.8) Assessment: Unchanged  Consider in home services vs. discussion regarding placement as I suspect there has been some decline since my last assessment, though difficult to tell under current circumstances. Follow up in one week.   Orders: [facility]- Est  Level 4 (56387)  Complete Medication List: 1)  Aggrenox 25-200 Mg Cp12 (Aspirin-dipyridamole) .... Take 1 capsule by mouth twice a day 2)  Lipitor 80 Mg Tabs (Atorvastatin calcium) .... Take 1 tablet by mouth once a day 3)  Namenda 5 Mg Tabs (Memantine hcl) .... Take 1 tablet by mouth once a day 4)  Tegretol 200 Mg Tabs (  Carbamazepine) .... Take 1 tablet by mouth twice a day. the neurologist will prescribe seizure medicine in the future 5)  Toprol Xl 50 Mg Tb24 (Metoprolol succinate) .Marland Kitchen.. 1 tab by mouth daily 6)  Tramadol-acetaminophen 37.5-325 Mg Tabs (Tramadol-acetaminophen) .... One every 4-6 hours as needed for pain. 7)  Hydrochlorothiazide 25 Mg Tabs (Hydrochlorothiazide) .... One tab by mouth qday  Patient Instructions: 1)  STOP taking Aggrenox for 5 days, then restart. 2)  Go to get a CT scan of your head. I will let you know the results (if it shows bleeding they will keep you in the hospital).  3)  Start taking HCTZ 25 mg daily for blood pressure.  4)  Come back in next Thursday for suture removal and to see how blood pressure is doing.  5)  Have someone make sure that Ms. Salatino is taking her seizure medications.  6)   Follow up with [facility] as soon as possible ([phone]) Prescriptions: HYDROCHLOROTHIAZIDE 25 MG TABS (HYDROCHLOROTHIAZIDE) one tab by mouth qday  #30 x 3   Entered and Authorized by:   Bobby Rumpf  MD   Signed by:   Bobby Rumpf  MD on 11/14/2009   Method used:   Faxed to ...       [facility] (retail)       [address]       [place], [place]  [address]       Ph: [phone]       Fax: [phone]   RxID:   [id number]      Prevention & Chronic Care Immunizations   Influenza vaccine: Fluvax 3+  (07/26/2007)   Influenza vaccine due: 07/25/2008    Tetanus booster: 01/04/1994: Done.   Tetanus booster due: 01/05/2004    Pneumococcal vaccine: Pneumovax (Medicare)  (07/26/2007)   Pneumococcal vaccine due: None    H. zoster vaccine: Not documented  Colorectal Screening   Hemoccult: Done.  (01/04/2001)   Hemoccult due: 01/04/2002    Colonoscopy: Not documented  Other Screening   Pap smear: Not documented    Mammogram: Done.  (02/05/2003)   Mammogram due: 02/05/2004    DXA bone density scan: Not documented   Smoking status: never  (12/13/2006)  Lipids   Total Cholesterol: Not documented   LDL: Not documented   LDL Direct: 85  (06/23/2007)   HDL: Not documented   Triglycerides: Not documented    SGOT (AST): 17  (06/23/2007)   SGPT (ALT): 10  (06/23/2007)   Alkaline phosphatase: 85  (06/23/2007)   Total bilirubin: 0.4  (06/23/2007)    Lipid flowsheet reviewed?: Yes   Progress toward LDL goal: At goal  Hypertension   Last Blood Pressure: 198 / 131  (11/14/2009)   Serum creatinine: 1.06  (09/12/2008)   Serum potassium 4.3  (09/12/2008)   Basic metabolic panel due: 11/20/2009    Hypertension flowsheet reviewed?: Yes   Progress toward BP goal: Deteriorated  Self-Management Support :   Personal Goals (by the next clinic visit) :      Personal blood pressure goal: 150/90  (11/14/2009)     Personal LDL goal: 100   (11/14/2009)    Patient will work on the following items until the next clinic visit to reach self-care goals:     Medications and monitoring: take my medicines every day, bring all of my medications to every visit  (11/14/2009)     Eating: use fresh or frozen vegetables,  eat foods that are low in salt  (11/14/2009)    Hypertension self-management support: Written self-care plan, Education handout  (11/14/2009)   Hypertension self-care plan printed.   Hypertension education handout printed    Lipid self-management support: Not documented     Lipid self-management support not done because: Good outcomes  (11/14/2009)

## 2010-10-06 NOTE — Assessment & Plan Note (Signed)
Summary: h/fup,tcb   Vital Signs:  Patient profile:   77 year old female Height:      63 inches Weight:      137.5 pounds BMI:     24.45 Temp:     97.7 degrees F oral Pulse rate:   81 / minute BP sitting:   142 / 98  (right arm) Cuff size:   regular  Vitals Entered By: Garen Grams LPN (December 10, 1608 2:52 PM) CC: Ann [name] Is Patient Diabetic? No Pain Assessment Patient in pain? no        Primary Care Provider:  Bobby Rumpf  MD  CC:  Ann [name].  History of Present Illness: 1) Hospital follow up: Admitted to [facility] for altered mental status. Found to have E coli UTI; treated with ceftriaxone to keflex with return to baseline mental. I had initially seen her on 11/14/09 after she had had a seizure on November 13, 2009, with head laceration and again on November 14, 2009, however, she became combative with her family at home and was brought to the hospital. CT head at hospital was negative for acute process. She has not had any further seizures or falls, remains at her baseline mental status and is not complaining of any pain. She has not shown any further decline with regards to her dementia. She has 24 supervision by her grand-daughter's boyfriend during the day and her daughter at night. Her Lipitor was stopped during her hospitalization.       Allergies: No Known Drug Allergies  Physical Exam  General:  alert and oriented to self as per usual Head:  2 cm well healed lac on forehead  Lungs:  Normal respiratory effort, chest expands symmetrically. Lungs are clear to auscultation, no crackles or wheezes. Heart:  normal rate, regular rhythm, no murmur, no gallop, and no rub.  Psych:  good eye contact and not depressed appearing., oriented to self    Impression & Recommendations:  Problem # 1:  DEMENTIA, NOT SPECIFIED (ICD-294.8) Assessment Improved  Delierium resolved; was likely secondary to her UTI. Continue Namenda as family notes difference when she is not taking this. 24 hour care with  family. Will fill FMLA for daughter when available.  Orders: [facility]- Est  Level 4 (96045)  Problem # 2:  CONVULSIONS, SEIZURES, NOS (ICD-780.39) Stable Continue Tegretol.  Her updated medication list for this problem includes:    Tegretol 200 Mg Tabs (Carbamazepine) .Marland Kitchen... Take 1 tablet by mouth twice a day. the neurologist will prescribe seizure medicine in the future  Problem # 3:  HEAD INJURY, SUPERFICIAL (ICD-873.8) Assessment: Improved  Well healed.   Orders: [facility]- Est  Level 4 (40981)  Complete Medication List: 1)  Aggrenox 25-200 Mg Cp12 (Aspirin-dipyridamole) .... Take 1 capsule by mouth twice a day 2)  Lipitor 80 Mg Tabs (Atorvastatin calcium) .... Take 1 tablet by mouth once a day 3)  Namenda 5 Mg Tabs (Memantine hcl) .... Take 1 tablet by mouth once a day 4)  Tegretol 200 Mg Tabs (Carbamazepine) .... Take 1 tablet by mouth twice a day. the neurologist will prescribe seizure medicine in the future 5)  Toprol Xl 50 Mg Tb24 (Metoprolol succinate) .Marland Kitchen.. 1 tab by mouth daily 6)  Tramadol-acetaminophen 37.5-325 Mg Tabs (Tramadol-acetaminophen) .... One every 4-6 hours as needed for pain. 7)  Hydrochlorothiazide 25 Mg Tabs (Hydrochlorothiazide) .... One tab by mouth qday     Prevention & Chronic Care Immunizations   Influenza vaccine: Fluvax 3+  (07/26/2007)  Influenza vaccine due: 07/25/2008    Tetanus booster: 01/04/1994: Done.   Tetanus booster due: 01/05/2004    Pneumococcal vaccine: Pneumovax (Medicare)  (07/26/2007)   Pneumococcal vaccine due: None    H. zoster vaccine: Not documented  Colorectal Screening   Hemoccult: Done.  (01/04/2001)   Hemoccult due: 01/04/2002    Colonoscopy: Not documented  Other Screening   Pap smear: Not documented    Mammogram: Done.  (02/05/2003)   Mammogram due: 02/05/2004    DXA bone density scan: Not documented   Smoking status: never  (12/13/2006)  Lipids   Total Cholesterol: Not documented   LDL: Not documented   LDL  Direct: 85  (06/23/2007)   HDL: Not documented   Triglycerides: Not documented    SGOT (AST): 17  (06/23/2007)   SGPT (ALT): 10  (06/23/2007)   Alkaline phosphatase: 85  (06/23/2007)   Total bilirubin: 0.4  (06/23/2007)  Hypertension   Last Blood Pressure: 142 / 98  (12/10/2009)   Serum creatinine: 1.06  (09/12/2008)   Serum potassium 4.3  (09/12/2008)   Basic metabolic panel due: 11/20/2009    Hypertension flowsheet reviewed?: Yes   Progress toward BP goal: At goal  Self-Management Support :   Personal Goals (by the next clinic visit) :      Personal blood pressure goal: 150/100  (12/10/2009)     Personal LDL goal: 130  (12/10/2009)    Hypertension self-management support: Written self-care plan, Education handout  (11/14/2009)    Hypertension self-management support not done because: Not indicated  (12/10/2009)    Lipid self-management support: Not documented     Lipid self-management support not done because: Good outcomes  (11/14/2009)

## 2010-10-06 NOTE — Letter (Signed)
Summary: FMLA  FMLA   Imported By: Clydell Hakim 12/24/2009 16:10:18  _____________________________________________________________________  External Attachment:    Type:   Image     Comment:   External Document

## 2010-10-06 NOTE — Miscellaneous (Signed)
Summary: Procedure Consent  Procedure Consent   Imported By: Bradly Bienenstock 11/18/2009 14:16:35  _____________________________________________________________________  External Attachment:    Type:   Image     Comment:   External Document

## 2010-10-06 NOTE — Miscellaneous (Signed)
Summary: Prior auth  Clinical Lists Changes  Recieved call form Myriam Jacobson at [facility]. Patient had CT done on 11/14/09 and now it needs to be approved. Since its retroactive MD needs to call [phone] dial ext [phone] then ext 3 and talk to ins to get procedure approved (cpt code for procedure 70450). Needs to be done by 11/20/09. Message to MD.   Jeanene Erb above phone number, spent 15 minutes on phone in order to try to get authorization; then got transferred to another department after answering many initial questions.  Spent additional 10 minutes on phone then got transferred to yet another department for clinical review. Spent a further 5 minutes on the phone with the clinical reviewer to get the approval number which is listed below.   Approval number (valid for 45 days) [id number]  Bobby Rumpf  MD  November 18, 2009 8:59 AM

## 2010-10-06 NOTE — Assessment & Plan Note (Signed)
Summary: Hospital admission   Primary Care Provider:  Bobby Rumpf  MD  CC:  AMS.  History of Present Illness:   77 y.o. african american presents with AMS. Pt was somnolent during interview as s/p Ativan 0.5mg  and Keppra load, for severe agitation. History obtained by daughter, Pt was in normal state of health until this weekend when she had 2 seizures (no trauma to head)  typically described as drawing hands upward and head rolls backward. Since then she has become more confused stating people where trying to kill her and then became combative, causing daughter to seek care. Last seizure per report was in March of 2011 when pt presented with similar symptoms of AMS and seizure at that time treated for E coli bacturia.  ROS- no fever, chest pain, abd pain, emesis, change in stools, incontinent at baseline, no rash, no change in meds. Daughter states she has missed some of her afternoon Tegretol levels. Daughter also pointed on growth on rigth foot.  Daugher -Sunday Spillers [phone]  Current Medications (verified): 1)  Aggrenox 25-200 Mg Cp12 (Aspirin-Dipyridamole) .... Take 1 Capsule By Mouth Twice A Day 2)  Lipitor 80 Mg Tabs (Atorvastatin Calcium) .... Take 1 Tablet By Mouth Once A Day 3)  Namenda 5 Mg Tabs (Memantine Hcl) .... Take 1 Tablet By Mouth Once A Day 4)  Tegretol 200 Mg Tabs (Carbamazepine) .... Take 1 Tablet By Mouth Twice A Day. The Neurologist Will Prescribe Seizure Medicine in The Future 5)  Toprol Xl 50 Mg  Tb24 (Metoprolol Succinate) .Marland Kitchen.. 1 Tab By Mouth Daily 6)  Hydrochlorothiazide 25 Mg Tabs (Hydrochlorothiazide) .... One Tab By Mouth Qday  Allergies (verified): No Known Drug Allergies  Past History:  Family History: Last updated: 11/03/2006 history of hypertension, No cancer or DM  Social History: Last updated: 05/25/2010 Lives with daughter [name]; Worked for [employer], now retired; hx EtOH abuse; No driving unable to verfify  tobacco/illicit drug use, no history recorded  Past Medical History: dilated CARDIOMYOPATHY with systolic dysfunction,  EF 55% ( 2006) FB in right ear -- no need to remove, Folstein 18/30 -- Neurologist checked 26/30?, [facility]  23/30 on 2/31/01, per records 18/30 ( 2010)  Hx Syncope Hosp 2001, and 08/2001,  R trigeminal neuralgia 6/03 HTN CKD (baseline 1-1.2) bilateral bunions Dementia-moderate chronic back pain s/o CVA seizure disorder  Cataracts h/o EOTH abuse Hypercholesterolemia Urge incontinence GERD  Past Surgical History: Echo--dilated cardiomyopathy +wall mot abnml - 07/07/2000, Stress Cardiolite-no evidence of ischemia 11/06/99 - 02/05/2000, TMJ Surgery ? Unknown when - Right foot/toe  surgery 2 years ago  Social History: Lives with daughter [name]; Worked for [employer], now retired; hx EtOH abuse; No driving unable to verfify tobacco/illicit drug use, no history recorded  Physical Exam  General:  T97.7 HR 112 RR 20 BP 135/105  92% RA GEN- somonolent s/p ativan, NAD HEENT- Perrl, EOMI, slightly dry mucous membranes, neck supple, cataracts bilat, atraumatic head CVS- irregular rhythm, mild tachycardia (90's at rest), 2 sec pause Resp- poor inspiratory effort, decreased BS at bases, scattered crackles at bases ABD- NABS, soft, NT/ND, bladder not palpated Ext- no edema, Right 5th digit, hyperkertinized growth , no erythema, no discharge, painful with manipulation Neuro- unable to assess completely, moving all 4 ext GU- no skin breakdown noted Additional Exam:  CXR- atelectasis and bilateral scarring, no acute infiltrate Tegretol level- 7.4 Bld Cx -pending  CBC 6.0/13.4/39.4/200 diff wnl Istat8 Na 142 K 3.7  Cl 104  BUN 27  Cr 1.4  Glu 111   UA SG 1.016  small Hb, TP 100 Positive nitrite, small leukocyte esterase    Microscopy 7-10 WBC  0-2 RBC  Bacteria many    Impression & Recommendations:  Problem # 1:  AMS  77 year  female with acute onset of AMS. AMS likley secodary to infectious urinary source. CXR negative for acute disease, no fever, leukocytosis noted. Other differentials include prolonged post-ictal state from seizure activity on Saturday however this would be out of the ordinary based on patients history, head trauma,new mediations, or cardiac etiology. As no truama noted on exam and no change in meds recently likely not cause. ? cardiac cause with irregularity on exam today not noted previously. Will admit to telemetry, continue Rocephin to cover for UTI await urine culture, regarding cardiac etiology, EKG, CE, TSH to be done. UDS. Pending clearance of benzo at beside, swallow exam .   Problem # 2:  Seizure Tegretol level therapuetic will continue Tegretol by mouth once patient awake. loaded with Keppra in ED. Increased seizure acitivty likley secondary to infection  Problem # 3:  UTI Pt with UTI in March with similar presentation, will treat with Rocephin await cultures and adjust meds accordingly  Problem # 4:  HTN Will give Lopressor IV until taking by mouth then resume home meds of Toprol, HTCZ  Problem # 5:  ACute on Chronic REnal insufficiency unclear baseline after reiview of charts likley between 1-1.2. Will hold any nephrotoxic agents, may have some component of rhabdo with recurrent seizures on Sat. gentle hydration and follow labs   Problem # 6:  FEN/GI IVF, swallow study  Prophylaxis- PPI history of GERD, Heparin subq 8hrs   Full Code  Dispo-pending clinical improvement, plan to d/c home with family   Complete Medication List: 1)  Aggrenox 25-200 Mg Cp12 (Aspirin-dipyridamole) .... Take 1 capsule by mouth twice a day 2)  Lipitor 80 Mg Tabs (Atorvastatin calcium) .... Take 1 tablet by mouth once a day 3)  Namenda 5 Mg Tabs (Memantine hcl) .... Take 1 tablet by mouth once a day 4)  Tegretol 200 Mg Tabs (Carbamazepine) .... Take 1 tablet by mouth twice a day. the neurologist will  prescribe seizure medicine in the future 5)  Toprol Xl 50 Mg Tb24 (Metoprolol succinate) .Marland Kitchen.. 1 tab by mouth daily 6)  Hydrochlorothiazide 25 Mg Tabs (Hydrochlorothiazide) .... One tab by mouth qday  I interviewed and examined Ann [name] and discussed her care with Dr Jeanice Lim. Deniece Portela A. Sheffield Slider, MD

## 2010-11-06 ENCOUNTER — Ambulatory Visit (INDEPENDENT_AMBULATORY_CARE_PROVIDER_SITE_OTHER): Payer: Medicare Other | Admitting: Family Medicine

## 2010-11-06 ENCOUNTER — Encounter: Payer: Self-pay | Admitting: Family Medicine

## 2010-11-06 VITALS — BP 164/111 | HR 105 | Temp 98.5°F | Ht 61.5 in | Wt 153.0 lb

## 2010-11-06 DIAGNOSIS — E785 Hyperlipidemia, unspecified: Secondary | ICD-10-CM

## 2010-11-06 DIAGNOSIS — G40909 Epilepsy, unspecified, not intractable, without status epilepticus: Secondary | ICD-10-CM

## 2010-11-06 DIAGNOSIS — R569 Unspecified convulsions: Secondary | ICD-10-CM

## 2010-11-06 DIAGNOSIS — E7800 Pure hypercholesterolemia, unspecified: Secondary | ICD-10-CM

## 2010-11-06 DIAGNOSIS — I10 Essential (primary) hypertension: Secondary | ICD-10-CM

## 2010-11-06 DIAGNOSIS — F068 Other specified mental disorders due to known physiological condition: Secondary | ICD-10-CM

## 2010-11-06 NOTE — Progress Notes (Signed)
  Subjective:    Patient ID: Ann [name], female    DOB: 03/16/1927, 77 y.o.   MRN: [id number]  Poor compliance with keeping appointments.   HPI 1) Seizure: Patient with known seizure disorder on tegretol w/ seizure 2 nights lasting for less than 5 minutes w/o intervention. Seizures usually generalized tonic - clonic activity. Last seizure was in December 2011. Carbamazepine level at that time was 6.6 (therapeutic range = 4-12). Patient displays post-ictal confusion / combativeness for few  hours after her seizures per daughter - relieved with Haldol as below. Family members help Ms. Bia with her medications.   2) HTN: Taking metoprolol and HCTZ without side effects. BP today 132/100. Denies chest pain, dyspnea, LE edema   3) Dementia: Last MMSE = 18, in 2010. Granddaughter states that her dementia seems to be stable. Patient able to clean, wash dishes, perform some ADLs.  Forgetful with regards to short term and remote memory. Granddaughter, daughter provide care as best as they can. On Namenda. Compounded by bilateral hearing loss.   4) HLD: Last lipid panel w/ LDL 85 3 years ago. Had stopped taking Lipitor after hospitalization in December 2011.    Review of Systems Denies recent falls (exceot for seizure), chest pain, nausea, emesis, diarrhea, constipation, urinary difficulties     Objective:   Physical Exam General:  alert, pleasantly demented as per baseline, hard of hearing, NAD  Eyes:  vision grossly intact, PERRL, EOMI, arcus senilis bilaterally Lungs:  normal respiratory effort and normal breath sounds.   Heart:  normal rate, regular rhythm, no murmur, no gallop, and no rub.  normal PMI Msk:  Able to bend over a touch her toes slowly, with only slight backward extension ROM noted. Lower extremitiy strength 4/5 bilaterally.  Very poor posture.  Pulses:  1+ radials  Extremities:  no LE edema  Neurologic:  Alert, oriented to self, pleasant, difficulty hearing and following  commands. cranial nerves II-XII intact and sensation intact to light touch. walks with cane        Assessment & Plan:

## 2010-11-06 NOTE — Assessment & Plan Note (Signed)
Not taking Lipitor. Will follow lipid panel and CMET to determine if need to restart. Follow up one month.

## 2010-11-06 NOTE — Assessment & Plan Note (Signed)
Continue current medications. No red flags on history or exam. Will follow in one month. Advised regarding lower salt intake. Will check CMET next week.

## 2010-11-06 NOTE — Assessment & Plan Note (Signed)
Will check carbamazepine level (was therapeutic last hospitalization but at low end of range). Consider dose increase based on results.

## 2010-11-06 NOTE — Assessment & Plan Note (Signed)
Appears stable. Continue Namenda for now given family reports that she is worse without this medication. Continue Haldol prn. Follow in one month.

## 2010-11-06 NOTE — Patient Instructions (Signed)
Come back next week to get your labs checked. Make sure you come in fasting. Continue to take all of your medications as directed. Follow up with me in 1 month.

## 2010-11-12 ENCOUNTER — Other Ambulatory Visit: Payer: Medicare Other

## 2010-11-12 DIAGNOSIS — G40909 Epilepsy, unspecified, not intractable, without status epilepticus: Secondary | ICD-10-CM

## 2010-11-12 DIAGNOSIS — I10 Essential (primary) hypertension: Secondary | ICD-10-CM

## 2010-11-12 DIAGNOSIS — E785 Hyperlipidemia, unspecified: Secondary | ICD-10-CM

## 2010-11-12 LAB — LIPID PANEL
LDL Cholesterol: 209 mg/dL — ABNORMAL HIGH (ref 0–99)
Total CHOL/HDL Ratio: 4.4 Ratio

## 2010-11-12 LAB — CBC
MCH: 30.2 pg (ref 26.0–34.0)
MCHC: 32.1 g/dL (ref 30.0–36.0)
MCV: 94 fL (ref 78.0–100.0)
Platelets: 160 10*3/uL (ref 150–400)
RBC: 4.01 MIL/uL (ref 3.87–5.11)
RDW: 13.8 % (ref 11.5–15.5)

## 2010-11-12 LAB — COMPREHENSIVE METABOLIC PANEL
ALT: 10 U/L (ref 0–35)
Albumin: 4.3 g/dL (ref 3.5–5.2)
Alkaline Phosphatase: 87 U/L (ref 39–117)
CO2: 25 mEq/L (ref 19–32)
Potassium: 4 mEq/L (ref 3.5–5.3)
Total Bilirubin: 0.5 mg/dL (ref 0.3–1.2)

## 2010-11-12 LAB — CONVERTED CEMR LAB
ALT: 10 units/L (ref 0–35)
Albumin: 4.3 g/dL (ref 3.5–5.2)
Alkaline Phosphatase: 87 units/L (ref 39–117)
CO2: 25 meq/L (ref 19–32)
Glucose, Bld: 79 mg/dL (ref 70–99)
LDL Cholesterol: 209 mg/dL — ABNORMAL HIGH (ref 0–99)
MCHC: 32.1 g/dL (ref 30.0–36.0)
Platelets: 160 10*3/uL (ref 150–400)
Potassium: 4 meq/L (ref 3.5–5.3)
RBC: 4.01 M/uL (ref 3.87–5.11)
Sodium: 140 meq/L (ref 135–145)
Total Protein: 7.1 g/dL (ref 6.0–8.3)
Triglycerides: 81 mg/dL (ref <150)
WBC: 3.6 10*3/uL — ABNORMAL LOW (ref 4.0–10.5)

## 2010-11-13 LAB — CARBAMAZEPINE LEVEL, TOTAL: Carbamazepine Lvl: 5.5 ug/mL (ref 4.0–12.0)

## 2010-11-16 LAB — CULTURE, BLOOD (ROUTINE X 2)
Culture  Setup Time: 201112230218
Culture  Setup Time: 201112230218
Culture: NO GROWTH
Culture: NO GROWTH

## 2010-11-16 LAB — URINALYSIS, ROUTINE W REFLEX MICROSCOPIC
Glucose, UA: NEGATIVE mg/dL
Hgb urine dipstick: NEGATIVE
Ketones, ur: 15 mg/dL — AB
Protein, ur: 30 mg/dL — AB

## 2010-11-16 LAB — DIFFERENTIAL
Basophils Absolute: 0 10*3/uL (ref 0.0–0.1)
Eosinophils Relative: 1 % (ref 0–5)
Lymphocytes Relative: 45 % (ref 12–46)
Lymphs Abs: 3 10*3/uL (ref 0.7–4.0)
Neutro Abs: 2.7 10*3/uL (ref 1.7–7.7)
Neutrophils Relative %: 42 % — ABNORMAL LOW (ref 43–77)

## 2010-11-16 LAB — BASIC METABOLIC PANEL
BUN: 16 mg/dL (ref 6–23)
Calcium: 8.3 mg/dL — ABNORMAL LOW (ref 8.4–10.5)
Creatinine, Ser: 1.02 mg/dL (ref 0.4–1.2)
Creatinine, Ser: 1.07 mg/dL (ref 0.4–1.2)
GFR calc Af Amer: >60 mL/min (ref >60)
GFR calc non Af Amer: 49 mL/min — ABNORMAL LOW (ref >60)

## 2010-11-16 LAB — URINE CULTURE

## 2010-11-16 LAB — POCT I-STAT, CHEM 8
Creatinine, Ser: 1.4 mg/dL — ABNORMAL HIGH (ref 0.4–1.2)
Glucose, Bld: 206 mg/dL — ABNORMAL HIGH (ref 70–99)
Hemoglobin: 15.6 g/dL — ABNORMAL HIGH (ref 12.0–15.0)
Sodium: 140 mEq/L (ref 135–145)
TCO2: 20 mmol/L (ref 0–100)

## 2010-11-16 LAB — CBC
MCV: 94.9 fL (ref 78.0–100.0)
Platelets: 193 10*3/uL (ref 150–400)
RBC: 4.52 MIL/uL (ref 3.87–5.11)
WBC: 6.5 10*3/uL (ref 4.0–10.5)

## 2010-11-16 LAB — URINE MICROSCOPIC-ADD ON

## 2010-11-16 LAB — RAPID URINE DRUG SCREEN, HOSP PERFORMED
Amphetamines: NOT DETECTED
Barbiturates: NOT DETECTED
Benzodiazepines: NOT DETECTED
Cocaine: NOT DETECTED
Opiates: NOT DETECTED
Tetrahydrocannabinol: NOT DETECTED

## 2010-11-16 LAB — CARBAMAZEPINE LEVEL, TOTAL: Carbamazepine Lvl: 6.6 ug/mL (ref 4.0–12.0)

## 2010-11-19 LAB — CBC
HCT: 35 % — ABNORMAL LOW (ref 36.0–46.0)
HCT: 39.4 % (ref 36.0–46.0)
Hemoglobin: 11.3 g/dL — ABNORMAL LOW (ref 12.0–15.0)
MCH: 30.3 pg (ref 26.0–34.0)
MCH: 30.5 pg (ref 26.0–34.0)
MCH: 31.6 pg (ref 26.0–34.0)
MCHC: 32.3 g/dL (ref 30.0–36.0)
MCHC: 32.9 g/dL (ref 30.0–36.0)
MCHC: 34 g/dL (ref 30.0–36.0)
MCV: 92.3 fL (ref 78.0–100.0)
MCV: 92.9 fL (ref 78.0–100.0)
MCV: 94.6 fL (ref 78.0–100.0)
Platelets: 163 K/uL (ref 150–400)
Platelets: UNDETERMINED 10*3/uL (ref 150–400)
RBC: 3.7 MIL/uL — ABNORMAL LOW (ref 3.87–5.11)
RDW: 13.3 % (ref 11.5–15.5)
RDW: 13.5 % (ref 11.5–15.5)
RDW: 13.5 % (ref 11.5–15.5)
WBC: 3.5 10*3/uL — ABNORMAL LOW (ref 4.0–10.5)
WBC: 3.8 K/uL — ABNORMAL LOW (ref 4.0–10.5)

## 2010-11-19 LAB — URINALYSIS, ROUTINE W REFLEX MICROSCOPIC
Nitrite: POSITIVE — AB
Protein, ur: 100 mg/dL — AB
Specific Gravity, Urine: 1.016 (ref 1.005–1.030)
Urobilinogen, UA: 1 mg/dL (ref 0.0–1.0)

## 2010-11-19 LAB — COMPREHENSIVE METABOLIC PANEL
AST: 22 U/L (ref 0–37)
Albumin: 3.4 g/dL — ABNORMAL LOW (ref 3.5–5.2)
Calcium: 8.9 mg/dL (ref 8.4–10.5)
Creatinine, Ser: 1.14 mg/dL (ref 0.4–1.2)
GFR calc Af Amer: 55 mL/min — ABNORMAL LOW (ref >60)
Total Protein: 6.9 g/dL (ref 6.0–8.3)

## 2010-11-19 LAB — POCT CARDIAC MARKERS
CKMB, poc: 3.1 ng/mL (ref 1.0–8.0)
Myoglobin, poc: 134 ng/mL (ref 12–200)

## 2010-11-19 LAB — POCT I-STAT, CHEM 8
Creatinine, Ser: 1.4 mg/dL — ABNORMAL HIGH (ref 0.4–1.2)
Hemoglobin: 15 g/dL (ref 12.0–15.0)
Potassium: 3.7 mEq/L (ref 3.5–5.1)
Sodium: 142 mEq/L (ref 135–145)

## 2010-11-19 LAB — URINE MICROSCOPIC-ADD ON

## 2010-11-19 LAB — CULTURE, BLOOD (ROUTINE X 2): Culture  Setup Time: 201109192145

## 2010-11-19 LAB — DIFFERENTIAL
Basophils Absolute: 0 10*3/uL (ref 0.0–0.1)
Basophils Relative: 0 % (ref 0–1)
Eosinophils Relative: 0 % (ref 0–5)
Monocytes Absolute: 0.7 10*3/uL (ref 0.1–1.0)

## 2010-11-19 LAB — CK TOTAL AND CKMB (NOT AT ARMC)
CK, MB: 3.2 ng/mL (ref 0.3–4.0)
Relative Index: 2.5 (ref 0.0–2.5)
Total CK: 128 U/L (ref 7–177)

## 2010-11-19 LAB — RAPID URINE DRUG SCREEN, HOSP PERFORMED
Cocaine: NOT DETECTED
Opiates: NOT DETECTED

## 2010-11-19 LAB — PROTIME-INR
INR: 1.16 (ref 0.00–1.49)
Prothrombin Time: 15 seconds (ref 11.6–15.2)

## 2010-11-20 ENCOUNTER — Telehealth: Payer: Self-pay | Admitting: Family Medicine

## 2010-11-20 MED ORDER — ASPIRIN-DIPYRIDAMOLE ER 25-200 MG PO CP12: 1.0000 | ORAL_CAPSULE | Freq: Two times a day (BID) | ORAL | Status: DC

## 2010-11-20 NOTE — Telephone Encounter (Signed)
Will route to her PCP. 

## 2010-11-20 NOTE — Telephone Encounter (Signed)
States that her BP is high and that the pharmacy has faxed in refill request on her Aggrenox Needs asap - [facility] [address]

## 2010-11-20 NOTE — Telephone Encounter (Signed)
To pcp to fill

## 2010-11-27 LAB — CBC
HCT: 37.2 % (ref 36.0–46.0)
Hemoglobin: 12.4 g/dL (ref 12.0–15.0)
Hemoglobin: 13.2 g/dL (ref 12.0–15.0)
MCHC: 33 g/dL (ref 30.0–36.0)
MCHC: 33.2 g/dL (ref 30.0–36.0)
MCV: 95.5 fL (ref 78.0–100.0)
MCV: 95.8 fL (ref 78.0–100.0)
MCV: 96.3 fL (ref 78.0–100.0)
Platelets: 125 10*3/uL — ABNORMAL LOW (ref 150–400)
Platelets: 134 10*3/uL — ABNORMAL LOW (ref 150–400)
RBC: 3.55 MIL/uL — ABNORMAL LOW (ref 3.87–5.11)
RDW: 14 % (ref 11.5–15.5)
RDW: 14.1 % (ref 11.5–15.5)
WBC: 5 10*3/uL (ref 4.0–10.5)

## 2010-11-27 LAB — URINE CULTURE

## 2010-11-27 LAB — URINE MICROSCOPIC-ADD ON

## 2010-11-27 LAB — BASIC METABOLIC PANEL
BUN: 20 mg/dL (ref 6–23)
BUN: 23 mg/dL (ref 6–23)
BUN: 24 mg/dL — ABNORMAL HIGH (ref 6–23)
CO2: 26 mEq/L (ref 19–32)
CO2: 27 mEq/L (ref 19–32)
CO2: 29 mEq/L (ref 19–32)
Calcium: 8.4 mg/dL (ref 8.4–10.5)
Calcium: 9.1 mg/dL (ref 8.4–10.5)
Chloride: 105 mEq/L (ref 96–112)
Chloride: 107 mEq/L (ref 96–112)
Creatinine, Ser: 1.05 mg/dL (ref 0.4–1.2)
Creatinine, Ser: 1.1 mg/dL (ref 0.4–1.2)
Creatinine, Ser: 1.15 mg/dL (ref 0.4–1.2)
GFR calc Af Amer: 58 mL/min — ABNORMAL LOW (ref >60)
GFR calc non Af Amer: 50 mL/min — ABNORMAL LOW (ref >60)
Glucose, Bld: 102 mg/dL — ABNORMAL HIGH (ref 70–99)
Glucose, Bld: 108 mg/dL — ABNORMAL HIGH (ref 70–99)
Glucose, Bld: 96 mg/dL (ref 70–99)
Potassium: 3.5 mEq/L (ref 3.5–5.1)
Potassium: 3.8 mEq/L (ref 3.5–5.1)
Sodium: 140 mEq/L (ref 135–145)

## 2010-11-27 LAB — DIFFERENTIAL
Basophils Absolute: 0 10*3/uL (ref 0.0–0.1)
Basophils Relative: 0 % (ref 0–1)
Eosinophils Absolute: 0 10*3/uL (ref 0.0–0.7)
Monocytes Absolute: 0.5 10*3/uL (ref 0.1–1.0)
Neutro Abs: 3.3 10*3/uL (ref 1.7–7.7)

## 2010-11-27 LAB — URINALYSIS, ROUTINE W REFLEX MICROSCOPIC
Bilirubin Urine: NEGATIVE
Glucose, UA: NEGATIVE mg/dL
Specific Gravity, Urine: 1.012 (ref 1.005–1.030)
Urobilinogen, UA: 1 mg/dL (ref 0.0–1.0)
pH: 7 (ref 5.0–8.0)

## 2010-11-27 LAB — POCT CARDIAC MARKERS: CKMB, poc: 2.1 ng/mL (ref 1.0–8.0)

## 2010-12-23 ENCOUNTER — Ambulatory Visit (INDEPENDENT_AMBULATORY_CARE_PROVIDER_SITE_OTHER): Payer: Medicare Other | Admitting: Family Medicine

## 2010-12-23 ENCOUNTER — Encounter: Payer: Self-pay | Admitting: Family Medicine

## 2010-12-23 DIAGNOSIS — I10 Essential (primary) hypertension: Secondary | ICD-10-CM

## 2010-12-23 DIAGNOSIS — Q828 Other specified congenital malformations of skin: Secondary | ICD-10-CM | POA: Insufficient documentation

## 2010-12-23 DIAGNOSIS — L851 Acquired keratosis [keratoderma] palmaris et plantaris: Secondary | ICD-10-CM

## 2010-12-23 NOTE — Assessment & Plan Note (Signed)
Large protuberant hornlike growth that replaces R 5th digit.  Poor foot hygiene.  Bunions.  For podiatry evaluation.  Patient on Aggrenox for CVA history.

## 2010-12-23 NOTE — Progress Notes (Signed)
  Subjective:    Patient ID: Ann [name], female    DOB: 03/16/1927, 77 y.o.   MRN: [id number]  HPI Patient seen in conjunction with [facility] med student Rayburn Go.   Patient here for evaluation of pain over R 5th toe, which has been present over the past 3 years since 5th toe amputation.  Intermittently painful, when bumps the area.  Not painful now.  No expression of purulence. No redness.  No recent trauma or falls.   Daughter is primary historian.  States that patient has pain to put shoes on.  Had seen podiatrist in the past for the toe amp, unsure reason for this.    Has had one seizure since last visit with Dr Wallene Huh, about 3 weeks ago.    Is taking BP meds daily as directed.    Review of Systems     Objective:   Physical Exam Alert, appropriate, no apparent distress.  Neck supple.  COR S1S2, no extra sounds.  PULM Clear bilat.  Feet: no edema. Palpable dp pulses bilat.  R 5th toe absent; hyperkeratotic growth that replaces the toe.  Erosion along bunion on L foot.  Toenails long and curled on both feet.  No other erosive lesions noted.  Walks with cane.        Assessment & Plan:

## 2010-12-23 NOTE — Assessment & Plan Note (Signed)
Patient with large keratotic growth that has replaced her R fifth toe since toe amputation approx 3-4 years ago. Unclear etiology for prior amputation.  Does not appear infected.  Has poor toenail and foot hygiene, hallux valgus on both great toes.  Palpable dp pulses bilaterally. Will refer to [facility] (podiatry) for foot care.

## 2010-12-23 NOTE — Assessment & Plan Note (Signed)
Patient with HTN taking toprol XL, HCTZ.  Will tolerate BP today which has improved somewhat when compared with prior readings on Flow Sheet.

## 2010-12-24 ENCOUNTER — Ambulatory Visit: Payer: Medicare Other

## 2010-12-25 NOTE — Progress Notes (Signed)
  Subjective:    Patient ID: Ann [name], female    DOB: 03/16/1927, 77 y.o.   MRN: [id number]  HPI    Review of Systems     Objective:   Physical Exam        Assessment & Plan:  See above.

## 2011-01-19 NOTE — Discharge Summary (Signed)
NAME:  Ann [name], Ann [name] NO.:  [id number]   MEDICAL RECORD NO.:  [id number]          PATIENT TYPE:  INP   LOCATION:  3035                         FACILITY:  [facility]   PHYSICIAN:  Paula Compton, MD        DATE OF BIRTH:  03/16/1927   DATE OF ADMISSION:  08/29/2008  DATE OF DISCHARGE:  08/31/2008                               DISCHARGE SUMMARY   PRIMARY CARE PHYSICIAN:  Bobby Rumpf, MD at [facility].   DISCHARGE DIAGNOSES:  1. Psychosis, resolved.  2. Dementia.  3. Epilepsy.  4. History of cerebrovascular accident.  5. Hypertension.  6. Hyperlipidemia.  7. Chronic renal insufficiency.   DISCHARGE MEDICATIONS:  1. Aggrenox 25/200 mg 1 tablet p.o. twice daily.  2. Lipitor 80 mg p.o. daily.  3. Tegretol 200 mg p.o. twice daily.  4. Toprol-XL 50 mg p.o. daily.   DISCONTINUED MEDICATIONS:  1. Namenda 5 mg p.o. daily.  2. Zestoretic or lisinopril/hydrochlorothiazide 10/12.5 mg p.o. daily.   CONSULTATIONS:  None.   PROCEDURES:  Chest x-ray on the day of admission showed stable  cardiomegaly and lingular scarring with no acute findings.   No head CT was able to be obtained due to the patient's agitation;  however, the patient had multiple head CTs in the recent past given her  history of CVA with no focal findings.   PERTINENT LABORATORY DATA:  The patient had complete metabolic panel on  the day prior to discharge showing a sodium of 141, potassium of 3.4,  bicarb of 27, BUN of 30, creatinine of 1.29, glucose of 102, calcium of  9.1.  LFTs were normal, albumin was 3.5.  Dementia workup included a  B12, which was equivocal at 263.  Folate, methylmalonic acid, and  homocysteine are pending at the time of discharge.  TSH was normal at  1.643.  Ammonia was minimally elevated at 38.  Urine drug screen,  salicylate, acetaminophen, and alcohol levels were all within normal  limits.  Tegretol was in the normal therapeutic range at 6.7.  Complete  blood count revealed a white blood cell count of 4.9, hemoglobin of  12.4, and platelets of 169.   BRIEF HOSPITAL COURSE:  This is a 77 year old African American female  with a history of dementia, CVA, and epilepsy who was admitted with  increased confusion and psychotic features.  Please see detailed history  and physical for further details of initial presentation.  1. Increased confusion and psychosis:  The patient has returned to      baseline by the day of discharge.  The patient underwent workup for      organic causes of her change in mental status; however, none were      identified with the exception of the equivocal B12 level.  As we      noted above, folate, methylmalonic acid, and homocysteine are      pending at the time of discharge.  The patient was unable to      tolerate a CT due to agitation; however, she did not have any  new      or focal neurologic deficits on exam.  Thus this was not felt to be      extremely important given her repeated head CTs in the past.  The      patient's psychotic symptoms were felt most likely due to      progression of her dementia.  This was explained at length to the      family.  Her Namenda was also held during admission and will      continued to be held at the time of discharge as psychotic features      can be a side effect of this medication.  Given the patient's      progression of dementia, it was felt that she was most safe with 24-      hour care at home which her daughter and granddaughter have agreed      to provide.  2. History of cerebrovascular accident:  The patient was continued on      home dose of Aggrenox and her statin.  3. History of epilepsy:  The patient was continued on her home dose of      Tegretol, and level was found to be within therapeutic range.  4. Hypertension:  The patient remained normotensive despite holding      Norvasc and lisinopril/hydrochlorothiazide.  These will continued      to be held at the  time of discharge.  5. Hyperlipidemia:  The patient was continued on her home dose of      statin.  6. Chronic renal insufficiency:  Creatinine was initially elevated at      1.58 on admission, however, had returned to baseline prior to      discharge at 1.29.   DISPOSITION:  A lengthy discussion was held with the daughter regarding  disposition to home with 24-hour care versus skilled nursing facility.  The daughter feels that she and the granddaughter by alternating their  work schedules will be able to provide 24-hour care at home and would  like to prolong nursing home placement as long as possible.  At her  agreement, we will order a home health evaluation in the outpatient  setting to evaluate the patient for the possibility of home PT and OT,  as well as any other service as indicated.  PT was unable to see the  patient prior to discharge in the hospital.  The patient does walk with  a cane at home at baseline and will continue to use this at the time of  discharge.  The patient's daughter again confirmed that she feels the  patient is at baseline both from mobility and mental status standpoint.   Pending results and issues to be followed at the time of discharge:  1. Follow up on mental status, as well as making a decision regarding      continued discontinuing of Namenda versus restarting.  2. Follow up on chronic renal insufficiency with repeat BMET at the      time of followup.  3. Follow up on hypertension as Norvasc and      lisinopril/hydrochlorothiazide were held at the time of discharge      due to low normal blood pressures with systolics ranging from 90s-      120s off these medications.   FOLLOWUP APPOINTMENTS:  The patient is to call the [facility] on Monday for an appointment Dr. Wallene Huh in the next 1-2  weeks.  DISCHARGE CONDITION:  The patient was discharged home in stable medical  condition and at her baseline with her  granddaughter.      Drue Dun, M.D.  Electronically Signed      Paula Compton, MD  Electronically Signed    EE/MEDQ  D:  08/31/2008  T:  09/01/2008  Job:  [id number]

## 2011-01-19 NOTE — H&P (Signed)
NAME:  Ann, [name] NO.:  [id number]   MEDICAL RECORD NO.:  [id number]          PATIENT TYPE:  INP   LOCATION:  3035                         FACILITY:  [facility]   PHYSICIAN:  Pearlean Brownie, M.D.DATE OF BIRTH:  03/16/1927   DATE OF ADMISSION:  08/29/2008  DATE OF DISCHARGE:  08/31/2008                              HISTORY & PHYSICAL   PRIMARY CARE PHYSICIAN:  Dr. Bobby Rumpf at [facility].   CHIEF COMPLAINT:  Altered mental status and hallucinations.   HISTORY OF PRESENT ILLNESS:  The patient is a 77 year old female with a  history of dementia, who presented with altered mental status,  hallucinations x3 days.  The patient is unable to provide any historical  information.  All of the historical information is obtained from her  daughter, Ann [name].  According to the daughter, the patient was at  her baseline level of cognitive function possibly 3 days prior to  admission.  She then began to have what the daughter believes  hallucinations and began to exhibit unusual behavior such as talking to  no one there, laughing at inappropriate times, saying that she was  talking about her, etc.  The patient has not had any recent medication  changes.  The patient has a history of alcohol abuse in the remote past,  and occasionally walked to the store for a beer or two, daughter reports  that she does not drink in excess.  Per daughter, at baseline, the  patient is able to converse some, ambulates on her own, and expressed  her fear.  The patient does not do any of her activities of daily  living.   During the day, however, the patient does stay by herself sometimes  while the daughter is at work.  The patient is able to answer some  review of system questions.  Denies any chest pain, shortness of breath,  constipation, diarrhea, nausea, vomiting, chills, or fever.  The patient  has a history of seizure disorder, followed by [facility].   The  patient also poorly was bitten by a dog in the home more than two days  prior to presentation.  Of note, chart review mentions history of  dementia with psychosis 2-3 years prior.   PAST MEDICAL HISTORY:  1. Trigeminal neuralgia.  2. Dementia.  3. Urge continence.  4. Benign systemic hypertension.  5. Hypercholesterolemia.  6. Hard of hearing.  7. Reflux.  8. History of cardiovascular accident.  9. Seizure disorder.  10.Constipation.  11.Bilateral bunion.  12.Dilated cardiomyopathy with systolic dysfunction.   MEDICATIONS:  1. Aggrenox 1 tab p.o. b.i.d.  2. Lipitor 80 mg p.o. daily.  3. Namenda 5 mg p.o. daily.  4. Tegretol 200 mg p.o. b.i.d.  5. Lisinopril/hydrochlorothiazide 10/12.5 daily.  6. Toprol-XL 50 mg one tab p.o. daily.   FAMILY HISTORY:  History of hypertension.   SOCIAL HISTORY:  The patient lives with daughter, Ann [name].  History  of remote alcoholism, currently drinks 1-2 beers every now and again.  Denies tobacco or illicit drug use.   REVIEW OF SYSTEMS:  As per HPI, otherwise negative.   PHYSICAL EXAMINATION:  VITAL SIGNS:  Blood pressure 110/84, respiratory  rate 20, heart rate 109, temperature 97.4, and oxygen saturation 98% on  room air.  GENERAL:  The patient is an elderly female in no apparent distress.  The  patient is alert and appears disheveled.  HEENT:  Extraocular movements intact.  Pupils equally round and reactive  to light.  Poor dentition and teeth missing.  No thyromegaly.  RESPIRATORY:  Poor respiratory effort.  LUNGS:  Clear to auscultation bilaterally without wheezes, rales, or  rhonchi.  Normal work of breathing.  CARDIOVASCULAR:  The patient is tachycardic.  Regular rhythm.  Normal S1  and S2 with no murmurs, rubs, or gallops appreciated.  ABDOMEN:  Soft, nontender, and nondistended with positive bowel sounds.  No organomegaly or masses palpated.  EXTREMITIES:  No clubbing, cyanosis, or edema.  Distal pulses 2+.   NEUROLOGIC:  The patient is moving all four extremities equally.  A 4/5  strength in bilateral upper and lower extremities.  PSYCHIATRIC:  The patient is oriented to self and place only.  She  appears to be guarding things in the room that are not present and  possibly responding to external stimuli.  The patient has poor  concentration.   LABORATORY DATA:  Sodium 139, potassium 3.8, chloride 99, bicarb 23,  glucose 141, BUN 21, creatinine 1.58.  Total bilirubin 0.9, alk phos 83,  AST 35, ALT 20, total protein 7.7, albumin 4.5, and calcium 9.6.  White  blood cell count 7, hemoglobin 14.4, hematocrit 43.6, and platelets 192.  Alcohol level less than 5, acetaminophen level less than 10, salicylate  level less than 4, Tegretol 6.7, and ammonia 38.  Urine drug screen  negative.  Urinalysis with small bili, 15 ketones, 100 of protein, small  leukocytes, few epis, and few bacteria.   CHEST X-RAY:  Moderate cardiomegaly as well as ectasia of the thoracic  aorta or aortic great vessels, which is stable from prior.  Lungs are  clear without evidence of pleural effusion.  Lingular scarring.   ASSESSMENT AND PLAN:  The patient is a 77 year old female with history  of dementia and seizure disorder, who presents with worsening of mental  status per her daughter and reported psychosis.   1. Altered mental status:  The patient does not have any identifiable      organic cause thus far.  Urine culture pending.  Also urinalysis      not concerning for infection.  The patient is unable to tolerate a      head CT secondary to agitation, although CVA would be unlikely.      This is likely progression of the patient's underlying dementia.      Namenda does have hallucinations as possible side effect.  We will      discontinue.  The patient will likely need placement as family is      unable to provide 24-hour care as she is likely not going to be      safe to be all by herself.  We will consult with  social work.  We      will use Haldol 5 mg p.r.n. agitation.  2. Hypertension:  Given the patient's acute renal failure we will hold      on ACE inhibitor and diuretics.  The patient is currently      normotensive.  We will continue metoprolol.  3. Hypercholesterolemia:  Continue Lipitor.  4. Gastroesophageal reflux disease:  We will place the patient on      Protonix.  5. History of cardiovascular accident:  Continue Aggrenox.  Unlikely      cause of altered mental status and exam is nonfocal.  We will not      try and review head CT at this time.  6. History of seizures:  Continue Tegretol.  She has a normal Tegretol      level.  7. Constipation:  Has a history of constipation. Daughter reports      normal bowel movements in the home.  We will place on Colace.  8. Fluids and nutrition/GI:  We will place the patient on heart-      healthy diet.  Her electrolytes is currently stable.  9. Prophylaxis:  Protonix and subcu heparin.  10.Disposition:  We will rule out organic causes of altered mental      status.  We will consult social work for placement, as the patient      is unsafe to be at home by herself unless family is able to provide      24-hour care.      Lauro Franklin, MD  Electronically Signed      Pearlean Brownie, M.D.  Electronically Signed    TCB/MEDQ  D:  09/02/2008  T:  09/03/2008  Job:  [id number]

## 2011-01-21 ENCOUNTER — Encounter: Payer: Self-pay | Admitting: Family Medicine

## 2011-01-21 ENCOUNTER — Ambulatory Visit (INDEPENDENT_AMBULATORY_CARE_PROVIDER_SITE_OTHER): Payer: Medicare Other | Admitting: Family Medicine

## 2011-01-21 VITALS — BP 170/100 | HR 60 | Temp 98.5°F | Ht 61.5 in | Wt 150.2 lb

## 2011-01-21 DIAGNOSIS — I10 Essential (primary) hypertension: Secondary | ICD-10-CM

## 2011-01-21 DIAGNOSIS — J189 Pneumonia, unspecified organism: Secondary | ICD-10-CM | POA: Insufficient documentation

## 2011-01-21 MED ORDER — HALOPERIDOL 0.5 MG PO TABS: 0.5000 mg | ORAL_TABLET | Freq: Three times a day (TID) | ORAL | Status: DC | PRN

## 2011-01-21 MED ORDER — MOXIFLOXACIN HCL 400 MG PO TABS: 400.0000 mg | ORAL_TABLET | Freq: Every day | ORAL | Status: AC

## 2011-01-21 NOTE — Assessment & Plan Note (Signed)
unconrtolled but not feeling good so will have pt come back at next visit and have it rechecked. Pt is taking her medications.

## 2011-01-21 NOTE — Assessment & Plan Note (Signed)
Pt has had a cough for three weeks, seems to have focal findings will not due imaging because will not change plan, no distress.  ? Of aspiration with hx of seizure so will go broader with Avelox, no hx of prolonged QT.  Total of 10 days then will see Dr. Wallene Huh for follow up.

## 2011-01-21 NOTE — Patient Instructions (Signed)
Nice to meet you I am giving you an antibiotic called Ave lox.  Take 1 pill daily for the next 10 days I will refill your haldol I want you to come back and see Dr. Wallene Huh in 2-3 weeks.

## 2011-01-21 NOTE — Progress Notes (Signed)
  Subjective:    Patient ID: Ann [name], female    DOB: 03/16/1927, 77 y.o.   MRN: [id number]  HPI Cough Duration: 3 weeks Productive: yes yellow to green Color: as above  What makes it worse: at night seems to be worse.  What makes it better: nothing so far. Blood: no Fever:no Weight loss: no Travel Hx:no New medications:no  Pt does have hx of seizure and did have a seizure about three weeks ago.    Review of Systems Denies fever, chills, nausea vomiting abdominal pain, dysuria, chest pain, shortness of breath dyspnea on exertion or numbness in extremities     Objective:   Physical Exam Gen: NAD, pleasant CV: RRR no murmur Pul: coarse throughout, does have focal finding left upper Lung field with rhonchi and decreased air movement.  Abd: BS + NT Ext: NT ND     Assessment & Plan:

## 2011-01-22 NOTE — H&P (Signed)
[place]. [facility]  Patient:    TAMIEKA, RANCOURT Visit Number: [id number] MRN: [id number]          Service Type: MED Location: [phone] Attending Physician:  Tobin Chad Dictated by:   Kinnie Scales. Reed Breech, M.D. Admit Date:  08/23/2001 Discharge Date: 08/25/2001                           History and Physical  CHIEF COMPLAINT:  "I fell out."  HISTORY OF PRESENT ILLNESS:  This pleasant, 77 year old, African-American female had a syncopal episode at work.  The patient reports raking leaves at the [employer] and the next thing she remembers if being transported to the hospital.  Co-workers called her daughter and said that Shweta had passed out and was slow to arouse and they called EMS.  Jimya does not recall any presyncopal aural-like symptoms preceding this.  No reports of arrhythmic movement or seizure-like activity was reported either.  After the episode, the patient was not confused, except for the episode where she had passed out.  REVIEW OF SYSTEMS:  No weight change, fevers, nausea, vomiting, diarrhea, or constipation.  No chest pain or palpitations.  Occasional constipation.  No upper or lower extremity weakness.  Positive sinus congestion.  Positive productive cough and chest discomfort for two weeks.  No rashes.  No confusion.  Decreased memory per daughter.  PAST MEDICAL HISTORY:  Hypercholesterolemia, dementia, alcohol abuse, trigeminal neuralgia neuralgia, tinnitus, hearing loss, hypertension, GERD, and constipation.  MEDICATIONS: 1. Aspirin 81 mg one p.o. q.d. 2. Tegretol 100 mg b.i.d. 3. Dyazide 37.5/25 mg q.d. 4. Lisinopril 10 mg q.d. 5. Toprol XL 100 mg p.o. q.d. 6. Zantac 150 mg one p.o. q.d. 7. Zocor 20 mg p.o. q.d. 8. Glucotrol XL 10 mg q.d.  ALLERGIES:  No known allergies.  SOCIAL HISTORY:  Drinks three to four light beers a day.  She lives with her daughter and grandson.  She works for [employer].  PROCEDURES:  Stress Cardiolite with no evidence of ischemia on November 06, 1999. Echocardiogram with dilated cardiomyopathy and positive wall motion abnormalities in November of 2001.  FAMILY HISTORY:  No history of cancer or diabetes.  Positive history of hypertension.  PHYSICAL EXAMINATION:  A pleasant, well-appearing, African-American female in no acute distress.  VITAL SIGNS:  Blood pressure 150/110, heart rate 80, respiratory rate 16-26, 94% on room air.  HEENT:  TMs clear and nonerythematous.  PERRLA.  Positive arcus senilis bilaterally.  Sclerae anicteric.  Erythematous tympanic membranes bilaterally. Positive rhinorrhea.  Oropharynx nonerythematous.  NECK:  No lymphadenopathy.  No JVD.  CHEST:  Clear to auscultation bilaterally.  HEART:  Regular rate and rhythm.  A faint 2/6 systolic ejection murmur.  ABDOMEN:  Nondistended.  No hepatosplenomegaly.  No guarding or rebound.  EXTREMITIES:  No clubbing, cyanosis, or edema.  Femoral, popliteal, and dorsalis pedis pulses 2+ and symmetric bilaterally.  SKIN:  Dry skin flaking over anterior shins, otherwise normal.  LABORATORY TESTS:  Chest x-ray:  Positive mild edema bilaterally.  Sodium 138, potassium 4.1, chloride 103, bicarbonate 31, BUN 13, creatinine 1.0, glucose 87.  The pH was 7.37 and pCO2 51.  WBC 3.7, hemoglobin 13.5, platelets 167, ANC 2.9.  The EKG showed T-wave inversion in V1-V4 with no significant change since December of 2000.  Total cholesterol 206, LDL 126, HDL 70.  ASSESSMENT:  A 77 year old African-American female with syncope, CHF,  hypertension, diabetes, and dementia.  IMPRESSION AND PLAN: 1. Syncope, rule out cardiac arrhythmia, vasovagal, long QT, myocardial    infarction, etc.  Will admit for rule out MI, especially with EKG changes    and her risk factors.  Other considerations are seizures, CVA,    neurocardiogenic, and hypoglycemia. 2. Congestive heart failure.  Will  diurese.  Increase ACE to 20 mg q.d.    Recheck chest x-ray in a.m.  May be secondary to cardiac ischemia.    Consider IV spironolactone for maximizing ACE. 3. Diabetes mellitus.  Her last A1C was less than 6.  I am doubtful that she    has diabetes, but if positive, likely diet controlled and not need    medications.  Hypoglycemic-induced syncope is also a consideration. 4. Alcoholism.  Advised cessation.  Have social work refer to [facility].  Will    check alcohol level. 5. Hypercholesterolemia.  Will continue current regimen on Zocor.  If she does    have diabetes mellitus, need to have Zocor increased to 40 mg and get total    cholesterol and LDL to goal. 6. Gastroesophageal reflux disease.  Continue medication. 7. Trigeminal neuralgia.  Continue Tegretol. 8. Hypertension.  Maximize therapy with ACE. Dictated by:   Kinnie Scales. Reed Breech, M.D. Attending Physician:  Tobin Chad DD:  08/25/01 TD:  08/26/01 Job: [id number]

## 2011-01-22 NOTE — Consult Note (Signed)
[place]. [facility]  Patient:    Ann, [name] Visit Number: [id number] MRN: [id number]          Service Type: MED Location: [phone] Attending Physician:  Tobin Chad Dictated by:   [name], M.D. Proc. Date: 08/24/01 Admit Date:  08/23/2001   CC:         Pearletha Alfred, M.D.   Consultation Report  REASON FOR CONSULTATION:  Syncope.  HISTORY OF PRESENT ILLNESS:  Ann [name] is a 77 year old, elderly, African-American female who is a poor historian, but apparently she had a syncopal episode while raking leaves at the [place] park, and the next thing she reports is being transported to the hospital.  By review of the chart, it is noted that coworkers noted that Ann [name] was slow to arouse and that they had called the EMS service.  [name] did not recall any presyncope.  There was no reported seizure activity.  There was no loss of bowel or bladder function.  Ann [name] has had a previous hospital admission for a syncopal workup.  At that time she had an adenosine Cardiolite performed in March 2001 which revealed no evidence of ischemia.  Echocardiogram performed in November 2001 revealed an ejection fraction of 30 to 40%.  There was mild aortic valvular sclerosis but no significant stenosis.  There was mild mitral regurgitation.  During her hospitalization of November 2001, there was no evidence of bradyarrhythmias or other ectopy.  ECG during this admission reveals a sinus bradycardia with a rate of 54 beats per minute.  She has a normal QTC interval.  Telemetry has revealed sinus bradycardia with occasional PVCs.  PAST MEDICAL HISTORY:  1. Dyslipidemia.  2. Dementia.  3. Alcohol abuse.  4. Trigeminal neuralgia.  5. Tinnitus.  6. Deafness.  7. Hypertension.  8. Gastroesophageal reflux disease.  9. Dilated cardiomyopathy with ejection fraction 30 to 40% felt to be     alcohol related. 10. Syncope.  PAST SURGICAL HISTORY:   Questionable for TMJ surgery.  SOCIAL HISTORY:  [name] continues to drink three to four beers per day.  She lives with her daughter and grandson, continues to work for [employer].  FAMILY HISTORY:  Noncontributory.  MEDICATIONS PRIOR TO ADMISSION:  1. Aspirin 81 mg daily.  2. Carbamazepine 100 mg p.o. b.i.d.  3. Diazide 37.5 mg/25 mg p.o. q.d.  4. Lisinopril 10 mg p.o. q.d.  5. Toprol XL 100 mg p.o. q.d.  6. Zantac 150 mg p.o. q.d.  7. Zocor 20 mg p.o. q.d.  MEDICATIONS AT THIS HOSPITALIZATION:  1. Lasix 100 mg IV.  2. Aspirin 325 mg daily.  3. Toprol 100 mg daily.  4. Pepcid 20 mg daily.  5. Zocor 20 mg daily.  6. Lisinopril 20 mg daily.  7. Triamterene/hydrochlorothiazide as per admission.  8. Tegretol 100 mg b.i.d.  9. She has received multivitamins and Librium p.r.n.  PHYSICAL EXAMINATION:  GENERAL:  An elderly African-American female in no acute distress.  Her conversation is somewhat inappropriate, and she is very difficult to understand.  VITAL SIGNS:  Systolic pressure is ranging 130 to 140/80.  O2 saturation is 96%.  Heart rate is ranging in the 50s.  She is afebrile.  HEENT:  Unremarkable.  NECK:  There is no evidence of neck vein distention.  There are no carotid bruits noted.  She has good carotid upstroke.  LUNGS:  Breath sounds are decreased at the bases, otherwise clear to auscultation.  CARDIOVASCULAR:  Reveals  a 2/6 holosystolic murmur noted at the apex.  EXTREMITIES:  No peripheral edema.  LABORATORY DATA:  As noted above.  Her serial cardiac enzymes have been negative.   Potassium 4.1, creatinine 1.0, glucose 87.  Her pH is 7.37.  White count 3.7, hemoglobin 13.5.  Chest x-ray reveals mild pulmonary edema.  ECG is as noted above.  IMPRESSION:  Recurrent syncope in a 77 year old female who has undergone and extensive cardiac workup in the past including transthoracic echocardiogram as well as adenosine Cardiolite.  Review of her  telemetry does not reveal significant arrhythmias.  It is possible that a bradyarrhythmia may have resulted in her syncope; therefore, I would decrease her Toprol to 50 mg daily.  At this time, I would not recommend a tilt table test in that there would be high likelihood of a false positive.  Should she have recurrent syncope, Paxil may be indicated.  In view of her history of seizure disorder, it may be that she had a seizure activity resulting in this episode.  The syncopal episode may also be secondary to alcohol abuse.  At this time, as noted above, would not recommend further cardiac workup.  Tilt table testing could be considered for recurrent syncope. Dictated by:   [name], M.D. Attending Physician:  Tobin Chad DD:  08/24/01 TD:  08/24/01 Job: [id number]

## 2011-01-22 NOTE — Discharge Summary (Signed)
Ann [name], Ann [name] NO.:  [id number]   MEDICAL RECORD NO.:  [id number]          PATIENT TYPE:  INP   LOCATION:  4728                         FACILITY:  [facility]   PHYSICIAN:  Henri Medal, MDDATE OF BIRTH:  03/16/1927   DATE OF ADMISSION:  01/21/2005  DATE OF DISCHARGE:  01/26/2005                                 DISCHARGE SUMMARY   DISCHARGE DIAGNOSES:  1.  Syncope.  2.  Hypertension urgency.  3.  Gastroesophageal reflux disease.  4.  Hypercholesterolemia.  5.  Seizure disorder.  6.  Dementia.   DISCHARGE MEDICATIONS:  1.  Carbamazepine (200 mg) one tab p.o. daily.  2.  Colace (100 mg) one tab p.o. daily.  3.  Zantac (150 mg) one tab p.o. daily.  4.  Metoprolol (50 mg) one tab p.o. b.i.d.  5.  HCTZ (25 mg) one tab p.o. daily.   BRIEF HISTORY AND HOSPITAL COURSE:  A 77 year old African-American female  with history of seizure disorder, hypertension, and dementia who had a  syncopal episode while waiting at the bus stop this afternoon, the afternoon  of admission.  The patient stated she was waiting, sitting on the seat and  the next thing she remembers the patient was riding to the hospital.  Eyewitness reports that the patient fell on her buttocks off the seat.  No  head struck.  No report of seizure activity.  No alcohol use reported,  although the patient does have a history of alcohol abuse.  The patient  states that she has not had any medication for at least one month.  The  patient denies symptoms of orthostasis, chest pain, palpitations, or  shortness of breath preceding the event.  The patient believed that she had  a seizure.  The patient did not have fecal incontinence.  The patient does  have chronic history of urinary incontinence.  The patient denies fever,  chills, sweats, nausea, vomiting, diarrhea, constipation.   PHYSICAL EXAMINATION ON ADMISSION:  VITAL SIGNS:  Temperature 98.6, heart  rate 81, systolic blood pressure range  between 213 and 175, diastolic blood  pressure range between 136 and 122.  O2 saturations were 96% on room air.  CARDIOVASCULAR:  PMI displaced laterally, left sternal border heave.  S1, S2  normal.  Regular rate and rhythm.  Strong peripheral pulses in four  extremities.   Otherwise, physical examination was unremarkable.   LABORATORY DATA ON ADMISSION:  Sodium 140, potassium 4, chloride 105, bicarb  29, BUN 18, creatinine 0.9, glucose 86, WBC 3.7, hemoglobin 12.9, platelets  156, ANC 2.5.  Alcohol level less than 5.  UA was normal.  Carbamazepine  level was less than 2 (supratherapeutic levels).  EKG showed normal sinus  rhythm, no axis deviation, decreased voltage, new T wave inversions in leads  V2, V3, and V5 when compared on July 2005.  CT scan showed chronic ischemic  and postoperative TMJ changes.  No acute disease.  A chest x-ray showed  stable marked cardiomegaly.  Linear atelectasis versus scar.  No acute  disease.   EMERGENCY DEPARTMENT COURSE:  Tegretol  was resumed at 200 mg p.o. x1.  Persistently elevated diastolic blood pressures greater than 120.  The  patient was treated unsuccessfully with Metoprolol 50 mg p.o. x1.  Lopressor  5 mg IV x1, and Labetolol 10 mg IV x2.  Blood pressure was checked manually  in four extremities.  No seizure activity was noted.   PROBLEM LIST:  1.  Syncope.  Unclear etiology.  Seizure versus transient ischemic attack,      versus cardiac origin.  Cardiac enzymes were ordered with three sets      within normal limits.  Myocardial infarction was ruled out.  Head CT      showed no ischemic changes.  Therefore, stroke versus transient ischemic      attack was ruled out.  A 2-D echo showed overall left ventricular      systolic function normal with EF of between 55-65%, left ventricular      wall thickness mildly increased, especially the septum.  Aortic IC valve      was mildly calcified.  Mild aortic valvular regurgitation.  Mild       fibrocalcific change of the aortic root.  Mild mitral annular      calcification.  Left atrium was mildly dilated.  Mild-to-moderate      tricuspid valvular regurgitation.  Right atrium was mildly dilated.      Composition compared to the previous study in November 2001.  The left      ventricle was decreased in size and improved in function.  MR unchanged.      Pericardial effusion no longer apparent.  TSH level was 1.082.  Most      likely either syncopal episode secondary to vagal origin versus seizure      episode.  The patient has not been compliant with any medication for one      month, therefore, seizure episode is still likely.  Throughout the days      of hospitalization, the patient was stable.  No orthostatic changes.  2.  Hypertension urgency.  Blood pressures went back to normal with      Labetalol drip.  This medication was discontinued, and the patient was      placed on metoprolol 50 mg b.i.d.  Blood pressure was stable until day      of discharge, it went up to 171/107.  HCTZ 25 mg one tab p.o. daily was      added to home meds.  At discharge, the patient was stable denying chest      pain, shortness of breath, headache, visual changes.  This issue to be      followed by primary care physician.  3.  Gastroesophageal reflux disease.  Stable throughout the days of      hospitalization.  The patient was discharged on home meds (Zantac 150 mg      p.o. daily).  4.  Hypercholesterolemia.  The patient has been treated for      hypercholesterolemia with Lipitor 40 mg daily as an outpatient.  A      fasting lipid profile during this admission showed cholesterol 173,      triglycerides 53, HDL 51, LDL 111, VLDL 11, total cholesterol/HDL ratio      is 3.4.  Given these normals, the patient was not discharged home on      statins.  This issue should be followed by primary physician. 5.  Seizures.  Throughout this hospitalization, the patient was free of  seizure episodes.   Carbamazepine serum levels were initially less than 2      mg/ml (supratherapeutic level).  This medication was restarted during      the hospitalization and Carbamazepine levels at discharge were 6.7      (therapeutic levels reached).  To be followed with primary physician.  6.  Dementia.  A Folstein test showed result of 20/30.  The patient is not      able to take care of herself.  The patient agrees to live with one of      her daughters, [name].  This patient's daughter is aware that given this      patient's history of dementia,  MMSE resolved, and history of alcohol      abuse, she needs to be supervised for her own safety.  Beulah's phone      number is [phone].   CONDITION ON DISCHARGE:  Stable.   PROCEDURE:  2-D echo, head CT.   FOLLOW UP:  The patient has a followup appointment with primary physician  Dr. Anastasio Auerbach at [facility] at [facility] on February 11, 2005 at  2:45 p.m.      FIM/MEDQ  D:  01/26/2005  T:  01/26/2005  Job:  [id number]   cc:   Anastasio Auerbach, MD  Fax: [phone]

## 2011-01-22 NOTE — H&P (Signed)
[place]. [facility]  Patient:    Ann [name], Ann [name]                        MRN: [id number] Adm. Date:  [id number] Attending:  Sela Hua Dictator:   Guadalupe Dawn, M.D. CC:         [name], M.D.  Catherine A. Orlin Hilding, M.D.  Kinnie Scales. Reed Breech, M.D.   History and Physical  PROBLEM: 1. Fall, secondary to seizure, versus syncope. 2. Sinus arrhythmia, versus new onset of atrial fibrillation. 3. Chronic alcohol abuse - last intake three days ago. 4. Hypertension, chronic. 5. Mild dementia. 6. Trigeminal neuralgia, chronic. 7. Gastroesophageal reflux disease.  HISTORY OF PRESENT ILLNESS:  This 77 year old African-American female presented to the [facility] Emergency Department on the day of admission by EMS, secondary to having a fall and possibly having a seizure this morning.  Her daughter, who is her caregiver states that she heard a loud crash downstairs this morning, and found the patient down, attempting to get up from the floor.  While she was attempting to stand, she began having shaking of her arms, head, and torso.  There was no loss of urine or bowel incontinence.  No tongue biting or drooling.  The daughter immediately called 911, and when the paramedics arrived, the patient was walking, behaving, and speaking normally.  The daughter reports that the patient was in her usual state of health today and recently.  There have been no recent falls, no recent stumbling.  The daughter reports that the patient had a seizure approximately six years ago, but has had no seizures before that episode or since.  Once in the emergency room the patient did have a seizure that was witnessed by the physicians assistant, and was described as twitching of the face and one of her arms.  She was treated with 0.5 mg of Ativan IV.  The patients daughter states that there is a history of an irregular heart beat, and that she is taking a  medication for irregular heart beat.  The patient does not have any recall of the events today.  Currently she denies pain, dizziness, or shortness of breath.  She denies any symptoms over the past several days as well.  PAST MEDICAL HISTORY:  See the problem list. 1. Right ear foreign body - not removed. 2. Status post TMJ surgery. 3. Stress Cardiolyte on November 06, 1999, with no evidence of ischemia.  CURRENT MEDICATIONS: 1. Aspirin 81 mg q.d. 2. Carbamazepine 100 mg p.o. b.i.d. 3. Cardizem CD 180 mg p.o. q.d. 4. HCTZ 25 mg p.o. q.d. 5. Triamterene 37.5 mg p.o. q.d. 6. Zantac 150 mg p.o. q.d.  ALLERGIES:  No known drug allergies.  SOCIAL HISTORY:  She lives in [place] with her daughter and grandson.  She is employed with the [employer] and [employer].  She still works daily. She does not drive.  She does all of her activities of daily living, and most of her instrumental activities of daily living.  She does drink one to two alcoholic drinks a day, mostly beer, but her daughter did find a bottle of liquor in her room.  The patients daughter states that her last drink was on Sunday.  The patient denies alcohol use.  No history of tobacco abuse.  FAMILY HISTORY:  No history of epilepsy, cancer, diabetes mellitus.  There is a history of hypertension.  REVIEW OF SYSTEMS:  Negative except  for the HPI.  PHYSICAL EXAMINATION:  VITAL SIGNS:  Temperature 100.5 degrees, pulse 88 and irregular, blood pressure 160/100, respirations 18.  Oxygen saturation 97% on room air.  GENERAL:  She is a well-appearing, thin African-American adult female, in no acute distress.  She is sleepy but responds to voice, and follows verbal commands appropriately.  She is oriented to place, day, time, and year.  HEENT:  [place]/AT.  PERRL.  EOMI.  Muddy sclerae bilaterally.  Oropharynx normal. Tympanic membranes:  Normal left, right obscured by foreign body.  NECK:  Supple with no  lymphadenopathy.  No jugular venous distention, no thyromegaly.  CHEST:  Coarse breath sounds in the left base.  No wheeze.  No rales.  Normal respiratory effort.  CARDIOVASCULAR:  Irregularly irregular rhythm with a 2/6 systolic ejection murmur, best heard at the right upper sternal border.  PMI is laterally displaced.  ABDOMEN:  Soft, nontender, nondistended.  Normoactive bowel sounds.  No hepatosplenomegaly.  No mass.  GENITOURINARY:  Normal female external genitalia.  No lesions.  RECTAL:  Deferred.  EXTREMITIES:  No edema.  NEUROLOGIC:  Strength +5/5 and symmetric bilaterally.  Question left pronator drift.  Gait is not tested.  Sensation intact globally.  Babinski downgoing bilaterally.  Deep tendon reflexes within normal limits bilaterally.  Can repeat no ___, ands, or buts appropriately.  LABORATORY DATA:  Hemoglobin 13.1, hematocrit 37.9, white blood cells 4100, platelets 190,000, MCV 88.9.  CPK 166, CPK-MB 1.4, troponin I 0.03. Urinalysis:  Specific gravity 1.016, otherwise negative.  CT of the head shows bilateral craniotomy scars with chronic white matter disease, no acute bleed.  Electrocardiogram shows irregular sinus rhythm, versus atrial fibrillation with a rate of 88, right atrial enlargement, and downgoing T-waves in V2 through V4 (chronic change).  IMPRESSION:   A 77 year old African-American female with a history of heavy alcohol abuse and hypertension, who presents status post fall with possible seizure, also possible seizure in the emergency department.  She is hemodynamically stable and not in status epilepticus.  She has a nonfocal neurological examination.  It is possible that her fall is related to seizure, brought on by either alcohol intoxication or alcohol withdrawal.  In addition,  her fall could be secondary to syncope, which she has experienced in the past. Also, she does have a foreign body in her right ear, which has been evaluated by Dr.  Jeannett Senior. Pollyann Kennedy, and it was felt that this did not need to be removed; however, this could be causing vertigo symptoms, although the patient denies this.  Also with question of atrial fibrillation which could be secondary to holiday heart syndrome.  The fall could have been caused by a tachyarrhythmia.  PLAN: 1. Admit to the [facility] and a telemetry bed,    to follow the cardiac rhythm, to further evaluate for atrial    fibrillation.  If this is atrial fibrillation, will contact her    cardiologist, Dr. [name], for management with cardioversion    or anticoagulation and delayed cardioversion. 2. Rule out a myocardial infarction with serial cardiac enzymes and    electrocardiograms. 3. Prophylaxis against alcohol withdrawal with Ativan IV 2 mg q.8h.    This should also prevent alcohol-induced seizures.  If she has    continued seizures, we will contact her neurologist. 4. Hypertension.  Her blood pressure is elevated currently.  This could    be secondary to her seizure, or alcohol withdrawal.  Will continue her    home  medications and observe. 5. Trigeminal neuralgia, chronic.  This is why she is taking Tegretol.    Will check the level. DD:  07/27/00 TD:  07/27/00 Job: [id number]

## 2011-01-22 NOTE — Discharge Summary (Signed)
[place]. [facility]  Patient:    Ann [name], Ann [name]                        MRN: [id number] Adm. Date:  [id number] Disc. Date: [id number] Attending:  McDiarmid, Leighton Roach CC:         Kinnie Scales. Reed Breech, M.D.  [name], M.D.   Discharge Summary  PROBLEM LIST: 1. Dementia. 2. Alcohol abuse. 3. Trigeminal neuralgia. 4. Tinnitus and hearing loss. 5. Hypertension. 6. Gastroesophageal reflux disease. 7. Congestive heart failure.  REASON FOR ADMISSION: 1. Convulsion, rule out seizure disorder. 2. Congestive heart failure.  DISCHARGE MEDICATIONS: 1. Aspirin 81 mg p.o. q.d. 2. Carbamazepine 100 mg p.o. b.i.d. 3. Cardizem 180 mg 1 p.o. q.d. 4. Hydrochlorothiazide 25 mg p.o. q.d. 5. Triamterene 37.5 mg p.o. q.d. 6. Zantac 150 mg 1 p.o. q.d. 7. Glucotrol XL 5 mg p.o. q.d. 8. Lisinopril 10 mg p.o. q.d.  HOSPITAL COURSE:  The patient was initially admitted with questionable focal seizure activity versus syncope.  Over hospitalization, she did not experience any further events.  She was rehydrated.  There was some question on some recent alcohol intake.  The family does their best to keep her away from it but apparently she does occasionally sneak alcohol.  She had an echocardiogram performed which revealed the left ventricle is moderately dilated.  Overall left ventricular systolic function is mildly decreased.  Her EF was 30-40%.  She had akinesis of the inferior-posterior wall, hypokinesis of the septal wall.  Her left ventricular wall thickness was normal.  Aortic valve was mildly calcified.  She had mild aortic valvular regurgitation, mild mitral valvular regurgitation.  The left atrium was mildly dilated and the right ventricle was mildly dilated.  Mild to moderate tricuspid valvular regurgitation and a small pericardial effusion.  Serial EKGs remained unchanged and the patient did rule out on cardiac enzymes.  At the time of discharge, on July 29, 2000, the patient was stable and returned to baseline.  Over the hospital course, the patient did get a hemoglobin A1C which was found to be markedly elevated at 12.9 and the patient was started on a low dose of ______.  Given her congestive heart failure, I would stay away from metformin or Actose.  I spoke with family before discharge.  She felt that she had regained her normal level of functioning and was very comfortable with her going home.  DISCHARGE DIAGNOSES: 1. Congestive heart failure. 2. Alcohol induced dementia.  FOLLOW-UP:  The patient was to follow up with Dr. Onalee Hua L. Priebe at the [facility] in the next one to two weeks.  The patient was also to follow up with Dr. [name] from [facility] in the next week.  I called to schedule an appointment and they computers were down.  They are going to call her on Monday to set up a time. DD:  07/29/00 TD:  07/31/00 Job: [id number]

## 2011-01-22 NOTE — Discharge Summary (Signed)
NAME:  Ann [name], Ann [name] NO.:  [id number]   MEDICAL RECORD NO.:  [id number]          PATIENT TYPE:  INP   LOCATION:  3025                         FACILITY:  [facility]   PHYSICIAN:  Leighton Roach McDiarmid, M.D.DATE OF BIRTH:  03/16/1927   DATE OF ADMISSION:  08/26/2006  DATE OF DISCHARGE:  08/30/2006                               DISCHARGE SUMMARY   ADMITTING DIAGNOSES:  1. Left upper extremity greater than left lower extremity weakness.  2. History of alcoholic cardiomyopathy, with an ejection fraction of      55-65% in 2006, with moderate tricuspid regurgitation, as well as      mild aortic regurgitation.  3. Hypertension.  4. Hyperlipidemia.  5. Dementia.  6. History of seizures.   DISCHARGE DIAGNOSES:  1. Cerebrovascular accident.  2. Hypertension.  3. Hyperlipidemia.  4. Prediabetes, with a hemoglobin A1c of 6.1.  5. Seizure disorder.  6. History of alcoholic cardiomyopathy.  7. Dementia.   PROCEDURES DURING HOSPITALIZATION:  1. Echocardiogram on August 29, 2006.  Results are pending at this      time.  2. Carotid Dopplers on August 29, 2006.  Results are pending at this      time.  3. CT of the head on August 26, 2006, which revealed atrophy and      small vessel disease, with no acute abnormalities.   HOSPITAL COURSE:  1. Ann [name] is a 77 year old with a history of hypertension,      hypercholesterolemia, as well as alcoholic dementia, who presented      with left upper extremity weakness greater than left lower      extremity weakness, as well as slurred speech.  It is uncertain of      the duration of time in which these symptoms occurred on the day of      admission.  However, given concern for stroke versus TIA, the      patient did have a stat CT scan of her head which revealed atrophy      with no small vessel disease and no acute abnormalities.  The      patient at that time was begun on aspirin 81 mg at her home dose      and was changed  to Aggrenox 1 tab p.o. b.i.d.  She was placed on      telemetry and had cardiac enzymes which were negative x3, and her      initial EKG showed sinus rhythm, no acute ST-T wave changes.  The      patient did undergo a swallow evaluation, given that we were      concerned that this represented a stroke, and she passed that at      this time.  Her echocardiogram and Dopplers are pending to look for      thromboembolic source of her stroke, as well as to ensure that the      patient is not at risk for carotid artery stenosis and need for      carotid endarterectomy.  Given also that she had a history of  seizure disorder, Tegretol level was obtained on her previous      admission, and her dose has stayed the same at Tegretol 200 mg      b.i.d.  Her level was 8.4, with the therapeutic range between 4-12.      There was no history of alcohol abuse, and the patient states that      she stopped drinking several years ago.  2. For hypertension, the patient did come in with hypertensive      urgency, with blood pressure 176/121.  She was placed back on home      medicines including Norvasc 2.5 mg daily, Toprol 50 mg XL daily,      HCTZ 25 mg daily, as well as lisinopril 20 mg daily.  A TSH was      obtained which was within normal limits at 1.602, and a urinalysis      showed no evidence of proteinuria.  3. For hyperlipidemia, the patient's total cholesterol was 204,      triglycerides 68, HDL of 54, and LDL of 136.  The patient was      placed on Lipitor 80 mg, and initial liver function tests given her      alcohol history included AST 19, ALT 10.  PT 14.5, and INR 1.1.      Her alcohol level on admission was less than 5.  4. For history of dementia, Mini-Mental Status Exam is about 20/30.      The patient is to continue on Namenda 5 mg daily.  Reversible      causes were worked up on her previous admission in October 2007,      where an RPR was nonreactive, B-12 was 1234, and RBC folate of  8.9.  5. For prediabetes, the patient's hemoglobin A1c was 6.1.  We will      attempt to __________ lifestyle modifications.  However, her      creatinine is 1, and she may be able to tolerate a low dose of      metformin.   MEDICATIONS AT DISCHARGE:  1. Norvasc 2.5 mg daily.  2. Aggrenox 1 tab p.o. b.i.d.  3. Lipitor 80  mg daily.  4. Tegretol 200 mg b.i.d.  5. HCTZ 25 mg.  6. Lisinopril   Dictation ended at this point.      Alanson Puls, M.D.    ______________________________  Leighton Roach McDiarmid, M.D.    MR/MEDQ  D:  08/29/2006  T:  08/29/2006  Job:  [id number]

## 2011-01-22 NOTE — H&P (Signed)
NAME:  Ann [name], ELLERMAN NO.:  [id number]   MEDICAL RECORD NO.:  [id number]          PATIENT TYPE:  EMS   LOCATION:  MAJO                         FACILITY:  [facility]   PHYSICIAN:  Melina Fiddler, MD DATE OF BIRTH:  03/16/1927   DATE OF ADMISSION:  01/21/2005  DATE OF DISCHARGE:                                HISTORY & PHYSICAL   HISTORY OF PRESENTING ILLNESS:  [name] is a 77 year old female with a  history of seizure disorder, hypertension, and dementia who had a reported  syncopal episode while waiting at the bus stop this afternoon.  She states  that she was waiting, sitting on the seat, and the next thing she remembers  she was riding to the hospital.  Witness reports that the patient fell on  her buttocks off of the seat.  No head stroke.  No report of seizure  activity.  No alcohol use reported, although the patient does have a  history.  The patient states that she has not had any medications for at  least the past month.  She denies any symptoms of orthostasis, chest pain,  palpitations, or shortness of breath preceding the event.  The patient  believes that she had a seizure.  She did not have any fecal incontinence  that was noted.  However, she has chronic urinary incontinence.  No  temperatures, chills, sweats, nausea, vomiting, diarrhea, or constipation.  Last bowel movement was the morning of admission.  No musculoskeletal pain.  No shortness of breath or cough.  The patient notes a chronically but stable  decreased appetite.   PAST MEDICAL HISTORY:  1.  Unspecified dementia.  2.  Hypertension.  3.  Hypercholesterolemia.  4.  History of alcohol use.  5.  Gastroesophageal reflux disease.  6.  Constipation.  7.  Seizure disorder.  8.  Hearing loss  9.  History of trigeminal neuralgia.  10. Cataracts.  11. Dilated cardiomyopathy, with systolic dysfunction.  A 2D echocardiogram      from November 2001 revealed a dilated cardiomyopathy with wall  motion      abnormality.  Stress Cardiolite showed no evidence of ischemia in March      2001.   PAST SURGICAL HISTORY:  Bilateral TMJ surgery, date unknown.   MEDICATIONS:  The patient has not taken any medications for more than the  past month.  Per chart review, the medications that she should be on  include:  1.  Aspirin 81 mg daily.  2.  Colace 200 mg each night.  3.  Haldol 0.5 mg - 1 mg p.r.n. agitation.  4.  Lipitor 40 mg daily.  5.  Tegretol 200 mg b.i.d.  6.  Toprol XL 100 mg daily.  7.  Zantac 150 mg daily.   ALLERGIES:  No known drug allergies.   FAMILY HISTORY:  No history of diabetes or cancer.  There is a family  history of hypertension.   SOCIAL HISTORY:  The patient lives with her granddaughter, Pema Thomure,  and her grandson.  She is widowed.  She previously worked for the  [employer] and [employer], but she is retired.  She drinks one to two  beers a day.   PHYSICAL EXAMINATION:  GENERAL:  The patient is a very lean elderly African-  American female who is resting comfortably, lightly sleeping, easily  aroused, pleasant and cooperative.  VITAL SIGNS:  Temperature 98.6; heart rate 81; systolic blood pressure  ranged 213-175; diastolic blood pressure ranged 136-122; O2 saturation 96%  on room air.  HEENT:  Normocephalic, atraumatic.  Mucosal membranes are pink and moist.  Slightly myotic but symmetric pupils.  The patient is edentulous.  Oropharynx is clear.  NECK:  No thyromegaly or masses.  LYMPHATICS:  No palpable anterior cervical or occipital lymphadenopathy  appreciated.  CARDIOVASCULAR:  Laterally displaced PMI, with left sternal border heave.  S1, S2.  Regular rate and rhythm.  No ectopy appreciated.  Strong peripheral  pulses in all four extremities.  LUNGS:  Good air exchange.  Clear to auscultation bilaterally, without  crackle, wheeze, or rhonchi.  ABDOMEN:  Nondistended, soft, nontender, without hepatosplenomegaly or   masses.  MUSCULOSKELETAL:  No peripheral erythema, edema or __________.  The patient  has reasonable range of motion in all four extremities.  NEUROLOGIC:  The patient is oriented to person, the month of May, Mother's  Day as a recent holiday, that she is in the hospital, and can explain with  good insight the events preceding her hospitalization.  Her cranial nerves  II-XII are intact.  Strength is 5/5.  Sensation intact to light touch.  No  tremor noted.   LABORATORIES:  I-stat electrolytes:  Sodium 140, potassium 4, chloride 105,  bicarbonate 29, BUN 18, creatinine 0.9, glucose 86.  White blood cell count  3.7, hemoglobin 12.9, platelets 156, ANC 2.5.  Alcohol level less than 5.  Urinalysis was yellow and clear, specific gravity 1.012, pH 7.  No nitrites,  leukocyte esterase, protein, or hemoglobin.  Urine culture and sensitivity  pending.  Carbamazepine level less than 2.  ECG:  Normal sinus rhythm, no  axis deviation, decreased voltage, new T wave inversions in leads V2, V3,  and V5 when compared to July 2005.  Head CT shows chronic ischemia and  postoperative TMJ changes.  No acute disease.  Chest x-ray:  Stable marked  cardiomegaly.  Linear atelectasis versus scar.  No acute disease.   ED COURSE:  Tegretol was resumed at 200 mg p.o. x 1.  Persistently elevated  diastolic blood pressure greater than 120.  This was treated unsuccessfully  with metoprolol 50 mg p.o. x 1; Lopressor 5 mg IV x 1; and labetalol 10 mg  IV x 2.  Blood pressure was checked manually and in all extremities.  No  seizure activity was noted.  The patient remained pleasant and resting  comfortably.  R.N. states that family that was previously present noted an  increased in the patient's symptoms when she was not on any medications in  the past.  Unclear of any seizure activity recently.  I attempted to contact  the patient's family.  However, at the number provided in the chart, there  was no  answer.  ASSESSMENT AND PLAN:  A 77 year old female with syncope and uncontrolled  hypertension.  1.  Syncope x 1.  No history of other recent similar events known.  No      report of clear seizure activity.  No evidence of infection currently.      Will check a CK to help rule out seizures.  Monitor  on telemetry to      evaluate for arrhythmia.  Cycle cardiac markers.  Check a TSH.  Tegretol      level is subtherapeutic.  Already resumed in the emergency department.      Negative alcohol test.  Her exam is nonfocal, which is reassuring.      Unlikely TIA or CVA.  Haldol is on medication list, but the patient      reports that she has not had any medications.  This would increase her      risk for seizures, hypertension, and neuroleptic malignant syndrome.      The patient's exam is not consistent with neuroleptic malignant syndrome      or anticholinergic syndrome.  2.  Hypertensive urgency.  Etiology is unclear.  Will continue.  The patient      has had medical noncompliance.  Will monitor on telemetry, provide      labetalol drip to decrease diastolic blood pressure to 100, keeping      systolic blood pressure greater than 100.  Holding oral Toprol.  3.  Seizure disorder.  Subtherapeutic Tegretol secondary to medical      nonadherence.  Resume Tegretol and seizure precautions.  4.  Dementia.  Patient with good disease insight.  Continue supportive      environment.  Note the patient has decreased hearing.  5.  Gastroesophageal reflux disease.  Continue ranitidine.  6.  FEN.  Electrolytes are within normal limits.  Provide soft mechanical      diet.   DISPOSITION PLANNING:  Anticipate return to granddaughter's home after  discharge.      SS/MEDQ  D:  01/22/2005  T:  01/22/2005  Job:  [id number]   cc:   Anastasio Auerbach, MD  Fax: [phone]

## 2011-01-22 NOTE — Op Note (Signed)
NAME:  Ann [name], Ann [name]                           ACCOUNT NO.:  [id number]   MEDICAL RECORD NO.:  [id number]                   PATIENT TYPE:  AMB   LOCATION:  DSC                                  FACILITY:  [facility]   PHYSICIAN:  Katy Fitch. Naaman Plummer., M.D.          DATE OF BIRTH:  03/16/1927   DATE OF PROCEDURE:  11/08/2002  DATE OF DISCHARGE:                                 OPERATIVE REPORT   PREOPERATIVE DIAGNOSIS:  1. Mass, dorsal aspect, left long finger, distal interphalangeal joint     consistent with giant cell tumor.  2. Mass, dorsal aspect. left long finger, metacarpophalangeal consistent     with a ganglion.   POSTOPERATIVE DIAGNOSIS:  1. Mass, dorsal aspect, left long finger, distal interphalangeal joint     consistent with giant cell tumor.  2. Mass, dorsal aspect. left long finger, metacarpophalangeal consistent     with a ganglion.   OPERATION PERFORMED:  1. Excision of a giant cell tumor from dorsal aspect of left long finger     distal interphalangeal joint.  2. Through separate incision, excision of ganglion from dorsal ulnar aspect     of left long finger metacarpophalangeal joint.   SURGEON:  Katy Fitch. Sypher, M.D.   ASSISTANT:  Jonni Sanger, P.A.   ANESTHESIA:  General by LMA.   SUPERVISING ANESTHESIOLOGIST:  Janetta Hora. Gelene Mink, M.D.   INDICATIONS FOR PROCEDURE:  The patient is a 77 year old woman referred by  Dr. Leveda Anna in [facility] for evaluation and management of a giant cell  tumor on the dorsal aspect of the left long finger at the DIP joint.  She  also is noted to have a mass over her MP joint.  She requested excisional  biopsy diagnosis and probable resolution.  After informed consent she is  brought to the operating room at this time.   DESCRIPTION OF PROCEDURE:  The patient was brought to the operating room and  placed in supine position on the operating table.  Following induction of  general anesthesia by LMA, the left arm was  prepped with Betadine soap and  solution and sterilely draped.  Following exsanguination of the limb with an  Esmarch bandage, an arterial tourniquet was inflated to 220 mmHg.  The  procedure commenced with a longitudinal incision directly over the mass at  the MP joint.  The subcutaneous tissues were carefully divided revealing a  typical ganglion filled with gelatinous, blood-tinged fluid.  This was  circumferentially dissected followed down to the capsule of the MP joint and  removed  with the entire stalk directly off the capsule of the MP joint.  This wound was repaired with intradermal 3-0 Prolene suture.   Attention was then directed to the PIP joint.  Through a separate incision  with skin reduction due to the expanse of the skin, a mass measuring  approximately 1.5 x 1.6 cm was removed from the  dorsal radial aspect of the  DIP joint.  This was consistent with a giant cell tumor with a multilobular  gray-orange appearance.  This was circumferentially dissected with  synovectomy of the DIP joint being accomplished.  Care was taken to protect  the extensor mechanism throughout dissection.  This was passed off in  Formalin for pathologic evaluation.  This wound was repaired with mattress  sutures of 5-0 nylon.  Both wounds were dressed with Xeroflo, sterile gauze  and a Coban dressing.   There were no apparent complications. The patient tolerated the surgery and  anesthesia well.  She was transferred to the recovery room with stable vital  signs.                                                Katy Fitch Naaman Plummer., M.D.    RVS/MEDQ  D:  11/08/2002  T:  11/09/2002  Job:  [id number]

## 2011-01-22 NOTE — H&P (Signed)
NAME:  Ann [name], Ann [name] NO.:  [id number]   MEDICAL RECORD NO.:  [id number]          PATIENT TYPE:  INP   LOCATION:  3025                         FACILITY:  [facility]   PHYSICIAN:  Rolm Gala, M.D.    DATE OF BIRTH:  03/16/1927   DATE OF ADMISSION:  08/26/2006  DATE OF DISCHARGE:                              HISTORY & PHYSICAL   HISTORY OF PRESENT ILLNESS:  A 77 year old female with a history of  dementia and alcohol abuse who comes in with left-sided weakness.  Patient's' daughter says that a sitter was with her earlier this  afternoon when she fell.  After the fall she had slurring speech and  left-sided weakness.  She later was walking around and going to her  cousins house across the street and she fell a second time at 4:45 p.m.  Family still thinks she has some left-sided weakness, although the  slurred speech has resolved.  Daughter watches patient take her pills at  night.  Says they are supposed to observe in the morning but probably  did not happen this morning as things were busy.  Patient admits to  eating bacon, fried chicken, and poor diet.   REVIEW OF SYSTEMS:  No fevers, no seizures, no headaches.  Positive for  cough and a URI symptoms x2 weeks.  Positive for sick contact and  granddaughter and great-grandson.  No vomiting.   PAST MEDICAL HISTORY:  1. Dialytic cardiomyopathy.  2. Hypercholesterolemia.  3. Dementia with psychotic features.  4. Alcohol abuse, quit.  5. Trigeminal neuralgia.  6. Cataracts.  7. Strength loss.  8. Hypertension.  9. Gastroesophageal reflux.  10.Constipation.  11.Convulsions.  12.Urgent incontinence.  13.Mini mental status exam 20/30.   MEDICATIONS:  1. Aspirin 81 mg daily.  2. Detrol LA 2 mg p.o. daily.  3. Lipitor 10 mg p.o. daily.  4. Tegretol 200 p.o. b.i.d.  5. Toprol XL 50 mg p.o. daily.  6. Risperdal 0.25 mg p.o. b.i.d.  7. Namenda 5 mg p.o. daily.  8. Hydrochlorothiazide/Lisinopril 25/20 mg p.o.  daily.  9. Omeprazole 20 mg daily.  10.Norvasc 2.5 mg p.o. daily.   ALLERGIES:  NONE.   Daughter desires to her to be a full code.   SOCIAL HISTORY:  Lives with granddaughter Marieclaire Bettenhausen and grandson.  She is widowed.  She used to work for the [employer] and [employer]  She is now retired, has a history of alcohol abuse.   PHYSICAL EXAMINATION:  VITAL SIGNS:  Temperature 98.5, pulse 71, blood  pressure 136/121 came down to 171/105, respirations 18, 100% on room  air.  GENERAL:  Patient generally appeared to be at gross mental status, alert  and oriented x3.  HEENT:  Head:  Atraumatic.  Eyes:  Extraocular muscles intact.  Pupils  equal, round, reactive to light.  Mouth:  Oropharynx clear with red  stained from peppermint.  LUNGS:  Lungs clear to auscultation bilaterally.  HEART:  Regular rate and rhythm, no murmurs.  ABDOMEN:  Soft, nontender.  Positive bowel sounds.  EXTREMITIES:  No clubbing, cyanosis, or  edema.  Good pedal pulse on  left, diminished on right.  NEUROLOGIC:  Cranial nerves II-XII intact.  Deep tendon reflex and  patellar ankle and brachial bilaterally.  Strength 5/5 in ankles, feet,  knee, hip, wrist, elbow, shoulder.  Strength 4/5 bilaterally interossei  muscles.  Romberg's sign normal.  No cervical posterior or anterior  lymphadenopathy.   LABORATORY DATA:  PTT 27, PT 14.5, INR 1.1.  Sodium 137, potassium 3.7,  chloride 102, bicarb 27.  BUN 16.  Creatinine 1.  Glucose 104.  Bilirubin 0.3.  AST 19, ALT 10.  Protein 6.5.  Albumin 3.5.  Calcium  9.1.  White blood cell 3.8.  H&H 12.1/36.  Platelets 154.  CT of head  atrophy and small vessel disease.  No acute processes.   ASSESSMENT/PLAN:  This is a 77 year old female with:  1. Transischemic attack.  Per family and the ER doctor said patient      had decreased strength in left leg, left arm, and slurred speech at      one point and time.  Strength currently 5/5 bilaterally except for      bilateral  interossei.  Patient does have risk factors for stroke      including hypertension, hypercholesterolemia, and a history of      alcohol abuse.  Will admit to tele, do swallow study, keep n.p.o.      until pass this.  Check carotid Dopplers, 2-D echo.  Keep blood      sugars less than 140, transcranial Dopplers.  PT/OT consult.  Check      cardiac enzymes, fasting lipid panel, and EKG.  Neuro check.  2. Hypertensive urgency.  Will restart home meds if patient passes      swallow study.  3. Elevated cholesterol.  Restart meds if passes swallow study.  Check      fasting lipid panel.  4. Gastroesophageal reflux.  Protonix p.o. if passes swallow study.      Rolm Gala, M.D.     Bennetta Laos  D:  08/26/2006  T:  08/27/2006  Job:  [id number]

## 2011-01-22 NOTE — Discharge Summary (Signed)
NAME:  Ann [name], MURRILLO NO.:  [id number]   MEDICAL RECORD NO.:  [id number]          PATIENT TYPE:  INP   LOCATION:  3030                         FACILITY:  [facility]   PHYSICIAN:  Melina Fiddler, MD DATE OF BIRTH:  03/16/1927   DATE OF ADMISSION:  06/20/2006  DATE OF DISCHARGE:  06/27/2006                                 DISCHARGE SUMMARY   ADMISSION DIAGNOSES:  1. Altered mental status/acute delirium with psychotic features.  2. Chronic dementia with baseline Mini-Mental Status of 20/30.  3. Hearing loss.  4. Seizure disorder.  5. Alcohol abuse.  6. Hypercholesterolemia.  7. Hypertension.  8. Gastroesophageal reflux disease.  9. Urge incontinence.  10.Dilated cardiomyopathy.  11.Trigeminal neuralgia.   DISCHARGE DIAGNOSES:  1. Chronic dementia with waxing-and-waning course and with psychotic      features and stable Mini-Mental Status Exam of 20/30.  2. Hearing loss.  3. Seizure disorder.  4. Alcohol abuse.  5. Hypercholesterolemia.  6. Hypertension.  7. Gastroesophageal reflux disease.  8. Urge incontinence.  9. Dilated cardiomyopathy.  10.Trigeminal neuralgia.   PROCEDURES:  1. EKG on June 20, 2006, showed normal sinus rhythm with no Q waves or      ST changes.  2. Head CT on June 20, 2006, showed no acute changes but stable chronic      changes including previous bilateral temporal craniectomy,      encephalomalacia bilaterally greatest on the right, atrophy, chronic      microvascular ischemic changes.  3. Chest x-ray on June 20, 2006, showed no acute changes.  Cardiomegaly      was present.  Stable soft-tissue prominence in the right peritracheal      region was also present.   CONSULTATIONS:  None.   BRIEF HOSPITAL COURSE:  This is a 77 year old African American female with  chronic dementia and multiple medical problems who presented with acute  delirium with psychotic features.  The patient was admitted and worked up to  rule  out any acute life-threatening causes of her acute delirium.  Overall,  the workup was negative and it was concluded that the patient was simply  having a waxing and waning course of her underlying chronic dementia with  psychotic features.   PROBLEM:  1. Acute delirium:  Patient had negative head CT, which was negative for      acute stroke or intracranial bleed or mass lesions.  Patient was also      ruled out for MI with cardiac enzymes being negative x3 and normal EKG.      Patient's Tegretol level was checked and found to be in the normal      therapeutic range at 5.6.  Patient's salicylate and acetaminophen      levels were well below toxic range.  Patient did have mildly elevated      ammonia at 39.  Patient's TSH was normal at 2.192.  Patient also had an      ABG, which showed a pH of 7.532, a pCO2 of 31.6, pAO2 of 105.0, a      bicarb of 28.4,  base deficit of 3.6, and oxygen saturation 98.4%.  This      was on admission.  Patient also had urinalysis, which was negative      except for 30 of protein.  Patient had urine and blood cultures, were      both negative.  2. Dementia:  Given negative workup for acute causes of delirium, it was      concluded that patient's altered mental status with psychotic features      was likely a waxing and waning course of her underlying dementia.      Patient's Haldol was switched to Risperdal 0.25 mg p.o. twice daily to      help control her psychotic features.  Patient was also started on      Namenda 5 mg p.o. daily for further management of her dementia      symptoms.  This has also been shown to help with psychotic features and      Lewy body dementia.  Is not known what type of dementia the patient      has, however, it was thought that she may benefit from this medication.      The medicine may be further titrated as an outpatient if family or      patient sees benefit.  Mini-Mental Status Exam was repeated in the      hospital prior to  initiation of the Namenda and found to be stable at      20/30.  Patient was observed to have a waxing and waning course of her      dementia throughout her hospitalization.  3. Seizure disorder:  Patient was continued on her home does of Tegretol      200 mg p.o. b.i.d., as her level was found to be within therapeutic      range.  Patient did not show any signs of seizure activity during her      admission.  4. Hypercholesterolemia:  Patient did have fasting lipid panel checked      while in the hospital, which showed a total cholesterol of 237,      triglycerides of 104, HDL of 68, and LDL of 148.  Given her elevated      LDL and goal of less than 147, the patient's home equivalent home does      of Lipitor 10 mg p.o. daily was continued while in the hospital and at      the time of discharge.  5. Hypertension:  Patient did have significantly elevated blood pressures      on admission, which remained difficult to control throughout her stay.      Her blood pressure medicines were titrated upward and her blood      pressures were under good control at the time of discharge ranging with      systolics of 99 to 124 over diastolics of 75 to 88.  Patient was      continued on her home dose of Toprol-XL 50 mg p.o. daily.  Her      hydrochlorothiazide dose was increased to 25 mg p.o. daily, and her      lisinopril dose was increased to 20 mg p.o. daily.  Norvasc 2.5 mg p.o.      daily was also added, at which point the patient's blood pressures were      well controlled.  6. Urge incontinence:  Patient was continued on home dose of Detrol LA 2      mg  p.o. daily.  7. GERD:  Patient was initially on Zantac 150 mg every morning for her      GERD.  However, given patient's acute delirium and underlying dementia,      this was changed to Protonix 40 mg p.o. daily, as H2 blockers can      exacerbate delirium in patients with underlying dementia. 8. Alcohol abuse:  Patient's alcohol level was less  than 5 on admission      and the importance of avoiding alcohol at the time of discharge was      discussed with patient and family.   LABORATORY DATA:  Patient's electrolytes and creatinine remained stable  throughout her admission.  Her CBC on the day of discharge showed a white  count of 4.8, hemoglobin of 13.9, hematocrit of 41.5, and platelets 159.  The patient did experience a transient thrombocytopenia during her admission  with a nadir of platelets of 101,000.  Coagulation studies, however, were  found to be within normal limits and peripheral smear was also found to be  normal.  This transient thrombocytopenia was thought to be secondary either  to Tegretol of alcohol use.  Platelets were normal; however, by the time of  discharge at 159,000.  Basic metabolic panel on the day of discharge showed  a sodium of 139, potassium of 4.1, chloride of 101, bicarb of 25, glucose of  140, BUN of 35, creatinine of 1.1, and calcium of 9.4.  Patient also  underwent labs for reversible causes of dementia, which revealed a normal  B12 and folate level as well as a nonreactive RPR.  Additional laboratory  data are as stated above in hospital course, problems.   DISCHARGE MEDICATIONS:  1. Detrol LA 2 mg p.o. daily.  2. Tegretol 200 mg p.o. twice daily.  3. Hydrochlorothiazide/lisinopril 25/20 mg p.o. daily (this is a new and      increased dose).  A 80-month supply prescription was provided with 1      refill.  4. Risperdal 0.25 mg p.o. twice daily (this is a new medication).  A 1-      month supply prescription with 1 refill was provided.  5. Namenda 5 mg p.o. once daily (this is a new medication).  A 1-week      supply with 1 refill was provided.  6. Lipitor 10 mg p.o. daily.  7. Toprol-XL 50 mg p.o. daily.  8. Omeprazole 20 mg p.o. daily (patient was advised to stop taking      Zantac).  9. Norvasc 2.5 mg p.o. daily (this is a new medication).  A 38-month supply      and 1 refill were  provided.  10.Aspirin 81 mg p.o. daily.   DISCHARGE INSTRUCTIONS:  Patient is to follow a low-salt, heart-healthy diet  with no activity restrictions.  Patient is to stop taking her Haldol and her  Zantac upon return to home.  Patient is also to avoid drinking alcohol.   FOLLOWUP APPOINTMENTS:  Patient has a followup appointment with Dr. Chuck Hint, her primary care Naphtali Zywicki at [facility] on July 01, 2006, at 1:30 p.m.   Patient was discharged home in stable condition.   PENDING RESULTS AND ISSUES TO BE FOLLOWED AT TIME OF DISCHARGE:  1. Hypertension:  Patient's blood pressure medicines were titrated while      in the hospital to achieve stable blood pressures by the time of      discharge; however, patient may require further  titration as an      outpatient.  Would consider increasing Norvasc to 5 mg p.o. daily if      needed.  2. Dementia:  Patient was started on Namenda 5 mg p.o. daily while in the     hospital.  This may be further titrated to 10 mg p.o. daily after 1      week if patient tolerates and if benefits as expected.  3. Patient was previously living alone prior to admission, which was      determined to be unsafe, given patient's demented state.  Patient was      kept in the hospital for almost 1 week in attempt to find placement;      however, prior to finding placement, patient's family decided they      would prefer to take the patient home to live with them.  Have ordered      a home health safety eval at the time of discharge and would recommend      followup of this as well as further evaluation as to patient's need for      skilled nursing facility or assisted living facility.  At current,      patient is refusing these services.     ______________________________  Drue Dun, M.D.    ______________________________  Melina Fiddler, MD    EE/MEDQ  D:  06/28/2006  T:  06/29/2006  Job:  [id number]

## 2011-01-22 NOTE — Discharge Summary (Signed)
[place]  Patient:    Ann [name], Ann [name] Visit Number: [id number] MRN: [id number]          Service Type: MED Location: [phone] Attending Physician:  Tobin Chad Dictated by:   Kinnie Scales. Reed Breech, M.D. Admit Date:  08/23/2001 Discharge Date: 08/25/2001   CC:         [name], M.D.   Discharge Summary  DISCHARGE DIAGNOSES: 1. Syncope. 2. Alcoholism. 3. Congestive heart failure. 4. Hypertension. 5. Dementia. 6. Trigeminal neuralgia. 7. Hypercholesterolemia.  HOSPITAL PROCEDURES: 1. Lower extremity Doppler, which is negative. 2. Head CT, which was negative for acute CVA.  DISCHARGE MEDICATIONS: 1. Toprol XL 50 mg q.d. 2. Aspirin 81 mg q.d. 3. Tegretol 100 mg p.o. b.i.d. 4. Dyazide 37.5/25 mg q.d. 5. Lisinopril 20 mg q.d. 6. Zantac 150 mg q.d. 7. Zocor 20 mg q.d. 8. Lasix 40 mg q.o.d. (this is not being sent home).  CONSULTATIONS:  [name], M.D., [facility].  ADMISSION HISTORY AND PHYSICAL:  A pleasant 77 year old African-American female who presented to [place] ER after being noted to have a syncopal episode at work ([employer] The patient apparently was raking leaves and had a falling episode.  She was slow to arouse and she was transported to [employer] Emergency Room.  The patient denies having a presyncopal or aura-like symptoms prior to this.  No reports of seizure-like activity, and no postictal period. The patient had a syncopal episode in November 2001.  The patient does have known coronary artery disease and alcoholic-induced cardiomyopathy with a reduced ejection fraction (40-50%).  The patient was admitted for further evaluation.  HOSPITAL COURSE: #1 - CARDIAC.  The patient is ruled out for an MI.  Cardiac enzymes are negative x 3.  Dr. Fraser Din evaluated the patient and deemed this to be unlikely cardiac; although if she  has another syncopal episode she may need tilt table testing versus Holter monitoring, etc.  The patients hypertension was controlled and ACE inhibitor was increased to 20 mg q.d.   She was known to be hypertensive on admission.  The patients Toprol has been decreased to 50 mg q.d.  The patients hypertension may easier to control if she stops drinking.  #2 - CONGESTIVE HEART FAILURE.  The patient did have, what appeared to be, fluid overload on her initial chest x-ray; however, this was not to be seen on a two-view x-ray.  The patient was noted to have a large aneurysmal thoracic aorta and cardiomegaly.  #3 - ALCOHOLISM.  The patient admits to 3-4 alcohol beverages a day of Natural Light; however, upon admission questioning she tells another nurse that she drinks approximately six beers a day.  Family members are aware of this and have been working on her for several years to try get her to decrease drinking.  The patient denies that she has a problem and she is not interested in intervention at this point.  #4 - DEMENTIA.  The patient may benefit from Aricept treatment; however, this was a _______________ did not confound her treatment.  #5 - TRIGEMINAL NEURALGIA.  The patient tolerated Tegretol well.  She will be discharged with this.  #6 - HYPERCHOLESTEROLEMIA.  The patients most recent cholesterol was relatively near normal limits.  No medication changes were made.  Continue on Zocor.  DISCHARGE DISPOSITION:  The patient is doing well today.  She will be discharged on the above medications.  She is to follow up with Dr. Onalee Hua L. Priebe on September 11, 2000 at 11:40 a.m. at [facility].  She will see [name], M.D. on a p.r.n. basis. Dictated by:   Kinnie Scales. Reed Breech, M.D. Attending Physician:  Tobin Chad DD:  08/25/01 TD:  08/26/01 Job: [id number]

## 2011-01-22 NOTE — Discharge Summary (Signed)
NAME:  Ann [name], Ann [name]                           ACCOUNT NO.:  [id number]   MEDICAL RECORD NO.:  [id number]                   PATIENT TYPE:  INP   LOCATION:  5151                                 FACILITY:  [facility]   PHYSICIAN:  Ace Gins, MD                  DATE OF BIRTH:  03/16/1927   DATE OF ADMISSION:  03/24/2004  DATE OF DISCHARGE:  03/28/2004                                 DISCHARGE SUMMARY   PRIMARY CARE PHYSICIAN:  Chrissie Noa A. Hensel, M.D.   DISCHARGE DIAGNOSES:  1. ETOH withdrawal.  2. Paroxysmal hypertension.  3. Dementia.   DISCHARGE MEDICATIONS:  1. Lipitor 40 mg tablet by mouth once daily.  2. Folic acid supplement 1 mg by mouth once daily.  3. Haldol 5 mg by mouth twice daily.  4. Metoprolol 100 mg by mouth once daily.  5. Thiamine 100 mg by mouth once daily.   DISCHARGE LABORATORY DATA:  BMET:  Sodium 142, potassium 4.6, chloride 108,  CO2 30, BUN 8, creatinine 1.0, glucose 100, calcium 9.1, TSH 1.416, RPR  negative.   CHIEF COMPLAINT AND HOSPITAL COURSE:  The patient is a 77 year old black  lady who originally presented with complaint per her daughter of altered  mental status; namely, she was confused and talking out of her head.  Upon  further investigation it was learned that the patient is a chronic alcohol  user with baseline consumption of one quart of Natural Light per day.  Two  days prior to admission, the patient apparently did not get her daily intake  of  alcohol and one day prior to admission it was noted that the patient was  confused and not oriented to her baseline level.  The patient was brought to  the emergency room where she was found to be confused and disoriented.  She  was admitted and put on a Librium protocol for ETOH withdrawal prophylaxis.  The patient was maintained on Librium for two hospital days and on hospital  day #2 she was found to have increased agitation, increased tangentiality,  and it was thought that she might be  having a psychotic component to her  disorientation.  0.5 mg of  Haldol b.i.d. was prescribed and the patient was  monitored for one additional day.  On hospital day #3, the  Librium protocol  was discontinued in order to evaluate the patient's possible CNS depression  secondary to the Librium.  On hospital day #4, she was found to be  dramatically more oriented aware of her person, place situation and month,  day of the week and year.  Given that the patient was no longer being  treated for alcohol withdrawal and was hemodynamically stable and afebrile,  it was decided that the patient would be discharged with close followup in  the family medical clinic.   During the course of the hospital stay, dementia workup was also  performed  including TSH, RPR and ammonia studies; all of which came back negative.   DISCHARGE INSTRUCTIONS:   MEDICATIONS:  See above.   PAIN MANAGEMENT:  Not applicable.   ACTIVITY:  No restrictions.   DIET:  No restrictions.   WOUND CARE:  Not applicable.   SPECIAL INSTRUCTIONS:  1. Return to emergency room if mental status changes.  2. Please do not continue alcohol consumption.   FOLLOW UP:  Family medicine followup has been scheduled for Tuesday, August  2 at 2:15 with Anastasio Auerbach, MD who is the patient's primary physician.      Lanetta Inch, Intern                       Ace Gins, MD    Hadley Pen  D:  03/27/2004  T:  03/29/2004  Job:  [id number]

## 2011-01-22 NOTE — Discharge Summary (Signed)
NAME:  Ann [name], Ann [name] NO.:  [id number]   MEDICAL RECORD NO.:  [id number]          PATIENT TYPE:  INP   LOCATION:  3025                         FACILITY:  [facility]   PHYSICIAN:  Leighton Roach McDiarmid, M.D.DATE OF BIRTH:  03/16/1927   DATE OF ADMISSION:  08/26/2006  DATE OF DISCHARGE:  08/29/2006                               DISCHARGE SUMMARY   ADDENDUM:   DISCHARGE MEDICATIONS:  1. Lisinopril 20 mg p.o. daily.  2. Toprol XL 50 mg daily.  3. Protonix 40 mg daily.  4. Risperdal 0.25 b.i.d.  5. Detrol long-acting 2 mg daily.   FOLLOWUP:  The patient was instructed to follow up with her primary care  physician at [facility], Dr. Corliss Marcus, and given  the number [phone] to schedule an appointment within the next 2 weeks  to be seen.  Issues to follow up on at visit will be a result of  echocardiogram as well as well as carotid Dopplers.      Alanson Puls, M.D.    ______________________________  Leighton Roach McDiarmid, M.D.    MR/MEDQ  D:  08/29/2006  T:  08/30/2006  Job:  [id number]

## 2011-04-09 ENCOUNTER — Other Ambulatory Visit: Payer: Self-pay | Admitting: Family Medicine

## 2011-04-12 NOTE — Telephone Encounter (Signed)
Refill request

## 2011-04-13 ENCOUNTER — Other Ambulatory Visit: Payer: Self-pay | Admitting: Family Medicine

## 2011-04-13 ENCOUNTER — Other Ambulatory Visit: Payer: Self-pay | Admitting: *Deleted

## 2011-04-13 DIAGNOSIS — R569 Unspecified convulsions: Secondary | ICD-10-CM

## 2011-04-13 MED ORDER — CARBAMAZEPINE ER 300 MG PO CP12: 300.0000 mg | ORAL_CAPSULE | Freq: Two times a day (BID) | ORAL | Status: DC

## 2011-04-13 NOTE — Telephone Encounter (Signed)
Dr. Perley Jain signed faxed refill request and faxed to pharmacy for carbamazepine.

## 2011-04-14 NOTE — Telephone Encounter (Signed)
Refill request

## 2011-06-01 LAB — POCT I-STAT, CHEM 8
BUN: 28 — ABNORMAL HIGH
Calcium, Ion: 1.1 — ABNORMAL LOW
Chloride: 110
Creatinine, Ser: 1.4 — ABNORMAL HIGH
Glucose, Bld: 95
HCT: 40
Hemoglobin: 13.6
Potassium: 4.3
Sodium: 140
TCO2: 26

## 2011-06-01 LAB — URINALYSIS, ROUTINE W REFLEX MICROSCOPIC
Ketones, ur: NEGATIVE
Leukocytes, UA: NEGATIVE
Nitrite: NEGATIVE
Specific Gravity, Urine: 1.025
pH: 5.5

## 2011-06-01 LAB — PHENYTOIN LEVEL, TOTAL: Phenytoin Lvl: <2.5 — ABNORMAL LOW

## 2011-06-01 LAB — DIFFERENTIAL
Lymphocytes Relative: 20
Lymphs Abs: 0.9
Monocytes Relative: 8
Neutro Abs: 3.1
Neutrophils Relative %: 70

## 2011-06-01 LAB — CARBAMAZEPINE LEVEL, TOTAL: Carbamazepine Lvl: 3.3 — ABNORMAL LOW

## 2011-06-01 LAB — CBC
Platelets: 168
RBC: 3.91
WBC: 4.4

## 2011-06-10 LAB — URINE MICROSCOPIC-ADD ON

## 2011-06-10 LAB — DIFFERENTIAL
Basophils Relative: 0 % (ref 0–1)
Lymphocytes Relative: 27 % (ref 12–46)
Monocytes Absolute: 0.8 10*3/uL (ref 0.1–1.0)
Monocytes Relative: 12 % (ref 3–12)
Neutro Abs: 4.2 10*3/uL (ref 1.7–7.7)
Neutrophils Relative %: 60 % (ref 43–77)

## 2011-06-10 LAB — CBC
HCT: 36.7 % (ref 36.0–46.0)
HCT: 43.6 % (ref 36.0–46.0)
Hemoglobin: 12.4 g/dL (ref 12.0–15.0)
Hemoglobin: 14.4 g/dL (ref 12.0–15.0)
MCHC: 33.1 g/dL (ref 30.0–36.0)
MCV: 95 fL (ref 78.0–100.0)
Platelets: 192 10*3/uL (ref 150–400)
RBC: 3.91 MIL/uL (ref 3.87–5.11)
RDW: 13.3 % (ref 11.5–15.5)

## 2011-06-10 LAB — RAPID URINE DRUG SCREEN, HOSP PERFORMED
Barbiturates: NOT DETECTED
Cocaine: NOT DETECTED
Opiates: NOT DETECTED

## 2011-06-10 LAB — URINALYSIS, ROUTINE W REFLEX MICROSCOPIC
Glucose, UA: NEGATIVE mg/dL
Hgb urine dipstick: NEGATIVE
Protein, ur: 100 mg/dL — AB
Specific Gravity, Urine: 1.025 (ref 1.005–1.030)
Urobilinogen, UA: 1 mg/dL (ref 0.0–1.0)

## 2011-06-10 LAB — COMPREHENSIVE METABOLIC PANEL
ALT: 17 U/L (ref 0–35)
Albumin: 4.5 g/dL (ref 3.5–5.2)
Alkaline Phosphatase: 68 U/L (ref 39–117)
Alkaline Phosphatase: 83 U/L (ref 39–117)
BUN: 21 mg/dL (ref 6–23)
CO2: 27 mEq/L (ref 19–32)
Calcium: 9.6 mg/dL (ref 8.4–10.5)
Chloride: 105 mEq/L (ref 96–112)
Creatinine, Ser: 1.58 mg/dL — ABNORMAL HIGH (ref 0.4–1.2)
GFR calc non Af Amer: 40 mL/min — ABNORMAL LOW (ref >60)
Glucose, Bld: 102 mg/dL — ABNORMAL HIGH (ref 70–99)
Glucose, Bld: 141 mg/dL — ABNORMAL HIGH (ref 70–99)
Potassium: 3.4 mEq/L — ABNORMAL LOW (ref 3.5–5.1)
Sodium: 141 mEq/L (ref 135–145)
Total Bilirubin: 0.4 mg/dL (ref 0.3–1.2)
Total Protein: 7.7 g/dL (ref 6.0–8.3)

## 2011-06-10 LAB — ETHANOL: Alcohol, Ethyl (B): <5 mg/dL (ref 0–10)

## 2011-06-10 LAB — SALICYLATE LEVEL: Salicylate Lvl: <4 mg/dL (ref 2.8–20.0)

## 2011-06-10 LAB — VITAMIN B12: Vitamin B-12: 263 pg/mL (ref 211–911)

## 2011-06-10 LAB — URINE CULTURE

## 2011-06-10 LAB — FOLATE RBC: RBC Folate: 240 ng/mL (ref 180–600)

## 2011-06-15 LAB — DIFFERENTIAL
Basophils Absolute: 0
Basophils Relative: 0
Eosinophils Absolute: 0
Eosinophils Relative: 0
Lymphocytes Relative: 9 — ABNORMAL LOW
Lymphs Abs: 0.4 — ABNORMAL LOW
Monocytes Absolute: 0.4
Monocytes Relative: 11
Neutro Abs: 3
Neutrophils Relative %: 79 — ABNORMAL HIGH

## 2011-06-15 LAB — COMPREHENSIVE METABOLIC PANEL
ALT: 13
AST: 19
Alkaline Phosphatase: 79
CO2: 27
Glucose, Bld: 114 — ABNORMAL HIGH
Potassium: 4.6
Total Protein: 6.7

## 2011-06-15 LAB — URINALYSIS, ROUTINE W REFLEX MICROSCOPIC
Bilirubin Urine: NEGATIVE
Glucose, UA: NEGATIVE
Ketones, ur: 15 — AB
Leukocytes, UA: NEGATIVE
Nitrite: NEGATIVE
Protein, ur: 100 — AB
Specific Gravity, Urine: 1.016
Urobilinogen, UA: 1
pH: 6.5

## 2011-06-15 LAB — CBC
HCT: 36.2
Hemoglobin: 12.1
MCHC: 33.5
MCV: 91.3
Platelets: 157
RBC: 3.97
RDW: 13.7
WBC: 3.8 — ABNORMAL LOW

## 2011-06-15 LAB — URINE CULTURE
Colony Count: NO GROWTH
Culture: NO GROWTH

## 2011-06-15 LAB — URINE MICROSCOPIC-ADD ON

## 2011-06-15 LAB — COMPREHENSIVE METABOLIC PANEL WITH GFR
Albumin: 3.7
BUN: 17
Calcium: 9.2
Chloride: 103
Creatinine, Ser: 1.07
GFR calc Af Amer: >60
GFR calc non Af Amer: 50 — ABNORMAL LOW
Sodium: 139
Total Bilirubin: 0.9

## 2011-06-30 ENCOUNTER — Emergency Department (HOSPITAL_COMMUNITY): Payer: Medicare Other

## 2011-06-30 ENCOUNTER — Emergency Department (HOSPITAL_COMMUNITY)
Admission: EM | Admit: 2011-06-30 | Discharge: 2011-06-30 | Disposition: A | Discharge status: Home / Self Care | Payer: Medicare Other | Attending: Emergency Medicine | Admitting: Emergency Medicine

## 2011-06-30 DIAGNOSIS — R262 Difficulty in walking, not elsewhere classified: Secondary | ICD-10-CM | POA: Insufficient documentation

## 2011-06-30 DIAGNOSIS — G40909 Epilepsy, unspecified, not intractable, without status epilepticus: Secondary | ICD-10-CM | POA: Insufficient documentation

## 2011-06-30 DIAGNOSIS — M79609 Pain in unspecified limb: Secondary | ICD-10-CM | POA: Insufficient documentation

## 2011-06-30 DIAGNOSIS — Z79899 Other long term (current) drug therapy: Secondary | ICD-10-CM | POA: Insufficient documentation

## 2011-06-30 DIAGNOSIS — I10 Essential (primary) hypertension: Secondary | ICD-10-CM | POA: Insufficient documentation

## 2011-06-30 DIAGNOSIS — F068 Other specified mental disorders due to known physiological condition: Secondary | ICD-10-CM | POA: Insufficient documentation

## 2011-06-30 LAB — URINALYSIS, ROUTINE W REFLEX MICROSCOPIC
Hgb urine dipstick: NEGATIVE
Protein, ur: 30 mg/dL — AB
Urobilinogen, UA: 1 mg/dL (ref 0.0–1.0)

## 2011-06-30 LAB — COMPREHENSIVE METABOLIC PANEL
Alkaline Phosphatase: 83 U/L (ref 39–117)
BUN: 24 mg/dL — ABNORMAL HIGH (ref 6–23)
GFR calc Af Amer: 51 mL/min — ABNORMAL LOW (ref >90)
Glucose, Bld: 97 mg/dL (ref 70–99)
Potassium: 3.7 mEq/L (ref 3.5–5.1)
Total Bilirubin: 0.6 mg/dL (ref 0.3–1.2)
Total Protein: 7.3 g/dL (ref 6.0–8.3)

## 2011-06-30 LAB — DIFFERENTIAL
Basophils Absolute: 0 10*3/uL (ref 0.0–0.1)
Basophils Relative: 0 % (ref 0–1)
Eosinophils Absolute: 0 10*3/uL (ref 0.0–0.7)
Eosinophils Relative: 1 % (ref 0–5)
Monocytes Absolute: 0.8 10*3/uL (ref 0.1–1.0)

## 2011-06-30 LAB — CARBAMAZEPINE LEVEL, TOTAL: Carbamazepine Lvl: 6.3 ug/mL (ref 4.0–12.0)

## 2011-06-30 LAB — CBC
MCHC: 33.7 g/dL (ref 30.0–36.0)
Platelets: 166 10*3/uL (ref 150–400)
RDW: 13.4 % (ref 11.5–15.5)

## 2011-06-30 LAB — URINE MICROSCOPIC-ADD ON

## 2011-07-01 LAB — URINE CULTURE: Culture  Setup Time: 201210241748

## 2011-07-31 ENCOUNTER — Other Ambulatory Visit: Payer: Self-pay | Admitting: Family Medicine

## 2011-08-01 NOTE — Telephone Encounter (Signed)
Refill request

## 2011-09-01 ENCOUNTER — Other Ambulatory Visit: Payer: Self-pay | Admitting: Family Medicine

## 2011-09-01 NOTE — Telephone Encounter (Signed)
Refill request

## 2011-10-06 ENCOUNTER — Other Ambulatory Visit: Payer: Self-pay | Admitting: Family Medicine

## 2011-10-06 MED ORDER — MEMANTINE HCL 5 MG PO TABS: 5.0000 mg | ORAL_TABLET | Freq: Every day | ORAL | Status: DC

## 2011-10-06 MED ORDER — CARBAMAZEPINE ER 300 MG PO CP12: 300.0000 mg | ORAL_CAPSULE | Freq: Two times a day (BID) | ORAL | Status: DC

## 2011-10-06 MED ORDER — METOPROLOL SUCCINATE ER 50 MG PO TB24: 50.0000 mg | ORAL_TABLET | Freq: Every day | ORAL | Status: DC

## 2011-10-07 ENCOUNTER — Encounter: Payer: Self-pay | Admitting: Family Medicine

## 2011-10-07 ENCOUNTER — Ambulatory Visit (INDEPENDENT_AMBULATORY_CARE_PROVIDER_SITE_OTHER): Payer: Medicare Other | Admitting: Family Medicine

## 2011-10-07 VITALS — BP 152/109 | HR 92 | Ht 62.0 in | Wt 143.0 lb

## 2011-10-07 DIAGNOSIS — I10 Essential (primary) hypertension: Secondary | ICD-10-CM

## 2011-10-07 DIAGNOSIS — M259 Joint disorder, unspecified: Secondary | ICD-10-CM

## 2011-10-07 DIAGNOSIS — R569 Unspecified convulsions: Secondary | ICD-10-CM

## 2011-10-07 DIAGNOSIS — Z23 Encounter for immunization: Secondary | ICD-10-CM

## 2011-10-07 DIAGNOSIS — R2230 Localized swelling, mass and lump, unspecified upper limb: Secondary | ICD-10-CM | POA: Insufficient documentation

## 2011-10-07 DIAGNOSIS — E7800 Pure hypercholesterolemia, unspecified: Secondary | ICD-10-CM

## 2011-10-07 MED ORDER — ZOSTER VACCINE LIVE 19400 UNT/0.65ML ~~LOC~~ SOLR: 0.6500 mL | Freq: Once | SUBCUTANEOUS | Status: AC

## 2011-10-07 MED ORDER — METOPROLOL SUCCINATE ER 50 MG PO TB24: 50.0000 mg | ORAL_TABLET | Freq: Every day | ORAL | Status: DC

## 2011-10-07 MED ORDER — TETANUS-DIPHTH-ACELL PERTUSSIS 5-2.5-18.5 LF-MCG/0.5 IM SUSP: 0.5000 mL | Freq: Once | INTRAMUSCULAR | Status: DC

## 2011-10-07 MED ORDER — HYDROCHLOROTHIAZIDE 25 MG PO TABS: 25.0000 mg | ORAL_TABLET | Freq: Every day | ORAL | Status: DC

## 2011-10-07 NOTE — Patient Instructions (Signed)
Thank you for coming into clinic today. I think overall you are doing very well. Please continue to take all of your medications as prescribed. If you should develop more seizures that last greater than 10 minutes or if she has several seizures in one day please come to the emergency room. Please come back in 2-3 weeks to meet with Dr. Lula Olszewski to discuss changing your seizure medication, check your blood pressure, and review your other medications. I've included some information below for you to review.   Seizure, Adult A seizure happens when there is uncontrolled activity in the brain. Most seizures cause either the whole body to shake or just one part of the body to shake. The shaking is called a convulsion. Other seizures cause the body or a part of the body to tense up.   HOME CARE    Keep all follow-up visits.     Take medicines as told by your doctor.     Do not swim until your doctor says it is okay.     Do not drive until your doctor says it is okay.     Make sure your friends, loved ones, and coworkers know to do the following things if you have a seizure:     Lower you gently to the ground.     Keep the area around you free of things that might fall or things that you might grab.     Avoid putting anything in or near your mouth.     Call your local emergency services (911 in [place]  GET HELP RIGHT AWAY IF:    You have confusion or feel dazed.     You feel sleepy.  MAKE SURE YOU:    Understand these instructions.     Will watch your condition.     Will get help right away if you are not doing well or get worse.  Document Released: 02/09/2008 Document Revised: 05/05/2011 Document Reviewed: 02/02/2010 [facility] Patient Information 2012 [place], [place].

## 2011-10-07 NOTE — Assessment & Plan Note (Signed)
Lipoma vs benign cyst vs malignancy. Likely lipoma. No involvement of surrounding structures. No intervention at this time. Family member advised of likely symptoms associated w/ tumor growth and likely threshold for evaluation for excision.

## 2011-10-07 NOTE — Assessment & Plan Note (Signed)
2 breakthrough seizures. Compliant w/ anti-seizure regimen. Discussed what to do in the event of further seizures. Daughter appeared well educated. Aware of when to call EMS. If continues to have breakthrough seizures will need to consider increasing dose of Carbatrol at next visit. Returning in 2-3 wks for f/u w/ Dr. Lula Olszewski

## 2011-10-07 NOTE — Assessment & Plan Note (Signed)
Reviewed lipid profile. Recommend direct LDL at next appt. To assess effectiveness of statin.

## 2011-10-07 NOTE — Assessment & Plan Note (Signed)
Hypertensive today in office. Has not taken medication for several days. No HA or other symptoms. HCTZ and Metoprolol refilled today. BP check when returns for f/u visit.

## 2011-10-07 NOTE — Progress Notes (Signed)
  Subjective:    Patient ID: Ann [name], female    DOB: 03/16/1927, 78 y.o.   MRN: [id number]  HPI Chief complaint: Seizure, hypertension  Seizure: Patient with long-standing history of seizures. Seizures x2 yesterday lasting approximately 45 minutes in duration approximately one hour apart. Minimal postictal state per patient. No loss of bowel or bladder function. Minimal jerking. Episodes occur while patient lying in bed so no associated fall. Seizures were witnessed by family. Patient on Carbatrol 300 mg he ID and has been compliant with medication. Family states seizures prior to this event were several years ago.  Hypertension. Patient has been out of this medication for the last 3-4 days. Will push 152/109 today in office. Patient and family here requesting refill of medications.  Left shoulder mass: Soft malleable left anterior shoulder mass that has been slowly growing for approximately the last 4 years. No associated pain, or musculoskeletal impingement associated with the mass.   Review of Systems Denies headache, chest pain, shortness of breath, syncope, lightheadedness, hematochezia, hematemesis, hematuria, headache, fever, rash, Dizziness, change in sensation, change in vision  Objective:   Physical Exam  Musc: Soft 2x4cm raised nonpainful moveable mass on the anterior aspect of the L shoulder. Neuro: Cranial nerves grossly intact. Romberg negative. PERRLA. Able to follow commands. Slow purposeful finger to nose. No focal deficits CV: RRR no m/r/g, 2+ distal pulses Res: CTAB, Normal effort.      Assessment & Plan:

## 2011-10-29 ENCOUNTER — Encounter: Payer: Self-pay | Admitting: Family Medicine

## 2011-10-29 ENCOUNTER — Ambulatory Visit (INDEPENDENT_AMBULATORY_CARE_PROVIDER_SITE_OTHER): Payer: Medicare Other | Admitting: Family Medicine

## 2011-10-29 VITALS — BP 161/114 | HR 103 | Temp 98.1°F | Ht 62.0 in | Wt 146.0 lb

## 2011-10-29 DIAGNOSIS — F068 Other specified mental disorders due to known physiological condition: Secondary | ICD-10-CM

## 2011-10-29 DIAGNOSIS — R569 Unspecified convulsions: Secondary | ICD-10-CM

## 2011-10-29 DIAGNOSIS — E7800 Pure hypercholesterolemia, unspecified: Secondary | ICD-10-CM

## 2011-10-29 DIAGNOSIS — I10 Essential (primary) hypertension: Secondary | ICD-10-CM

## 2011-10-29 LAB — BASIC METABOLIC PANEL
CO2: 27 mEq/L (ref 19–32)
Chloride: 100 mEq/L (ref 96–112)
Potassium: 4.5 mEq/L (ref 3.5–5.3)
Sodium: 138 mEq/L (ref 135–145)

## 2011-10-29 LAB — LDL CHOLESTEROL, DIRECT: Direct LDL: 205 mg/dL — ABNORMAL HIGH

## 2011-10-29 MED ORDER — ZOSTER VACCINE LIVE 19400 UNT/0.65ML ~~LOC~~ SOLR: 0.6500 mL | Freq: Once | SUBCUTANEOUS | Status: AC

## 2011-10-29 MED ORDER — HALOPERIDOL 0.5 MG PO TABS: 0.5000 mg | ORAL_TABLET | Freq: Three times a day (TID) | ORAL | Status: DC | PRN

## 2011-10-29 MED ORDER — METOPROLOL SUCCINATE ER 50 MG PO TB24: 100.0000 mg | ORAL_TABLET | Freq: Every day | ORAL | Status: DC

## 2011-10-29 NOTE — Progress Notes (Signed)
  Subjective:    Patient ID: Ann [name], female    DOB: 03/16/1927, 78 y.o.   MRN: [id number]  HPI  Ann [name] comes in for follow up. Her daughter accompanies her today   Seizures- She was seen here about a month ago for two seizures.  Since that time, she has not had any more seizures.  She is feeling well, has no complaints. Her daughter is able to explain proper seizure care to me, and when to call EMS.   HTN-Pt is taking HCTZ and atenolol without difficulty.  She denies headaches, dizziness, chest pain or dyspnea.   HLD- pt's daughter says she used to take a cholesterol medication but someone stopped it, she is not sure why.    Dementia- daughter feels like her mother's memory is stable, she needs help with some ADL's, like bathing, and she no longer cooks, but can dress, toilet, and feed herself.  Her daughter says the used to have rx for Haldol PRN for when she gets anxious, and it worked really well.   Review of Systems Pertinent items in HPI.     Objective:   Physical Exam BP 161/114  Pulse 103  Temp(Src) 98.1 F (36.7 C) (Oral)  Ht 5\' 2"  (1.575 m)  Wt 146 lb (66.225 kg)  BMI 26.70 kg/m2 General appearance: alert, cooperative and no distress Head: Normocephalic, without obvious abnormality, atraumatic Lungs: clear to auscultation bilaterally Heart: regular rate and rhythm, S1, S2 normal, no murmur, click, rub or gallop Abdomen: soft, non-tender; bowel sounds normal; no masses,  no organomegaly Extremities: extremities normal, atraumatic, no cyanosis or edema Pulses: 2+ and symmetric Skin: Skin color, texture, turgor normal. No rashes or lesions Neuro: Pt is oriented to person and place, but not to time.  She has no facial asymmetry or abnormal tongue movements.  She has good strength of upper and lower extremities.        Assessment & Plan:

## 2011-10-29 NOTE — Assessment & Plan Note (Signed)
Seem to be controlled again without medication changes.  I will not make any more changes at this point, but if breakthrough seizures continue she may need to go back to Neurology.

## 2011-10-29 NOTE — Assessment & Plan Note (Signed)
Appears to be stable, and pt well cared for by family.  Continue Namenda at current dose.

## 2011-10-29 NOTE — Assessment & Plan Note (Signed)
Elevated, and pt with borderline high pulse.  Will double Metoprolol and continue HCTZ.

## 2011-10-29 NOTE — Assessment & Plan Note (Signed)
Pt's last lipid panel on file (11/2010) was elevated, not sure why cholesterol medication was discontinued.  Will check LDL.

## 2011-10-29 NOTE — Patient Instructions (Signed)
It was good to see you.  Your blood pressure today was BP: 161/114 mmHg.  Remember your goal blood pressure is about 120/80.  Please be sure to take your medication every day.  We will increase your metoprolol to 100 mg by mouth daily.    I will send you a letter with lab results, or I will call if we need to make any medicine changes.

## 2011-11-02 ENCOUNTER — Encounter: Payer: Self-pay | Admitting: Family Medicine

## 2011-11-02 ENCOUNTER — Telehealth: Payer: Self-pay | Admitting: Family Medicine

## 2011-11-02 DIAGNOSIS — E7800 Pure hypercholesterolemia, unspecified: Secondary | ICD-10-CM

## 2011-11-02 NOTE — Telephone Encounter (Signed)
Called and left voicemail, pt's direct LDL was elevated, need to get fasting lipid panel.  Asked pt or her daughter to call and schedule lab appointment for fasting labs.  The order for the lab is in.

## 2011-12-31 ENCOUNTER — Other Ambulatory Visit: Payer: Medicare Other

## 2011-12-31 ENCOUNTER — Telehealth: Payer: Self-pay | Admitting: Family Medicine

## 2011-12-31 DIAGNOSIS — E7800 Pure hypercholesterolemia, unspecified: Secondary | ICD-10-CM

## 2011-12-31 LAB — LIPID PANEL
HDL: 64 mg/dL (ref >39)
LDL Cholesterol: 173 mg/dL — ABNORMAL HIGH (ref 0–99)
Total CHOL/HDL Ratio: 3.9 Ratio
VLDL: 14 mg/dL (ref 0–40)

## 2011-12-31 MED ORDER — METOPROLOL SUCCINATE ER 50 MG PO TB24: 100.0000 mg | ORAL_TABLET | Freq: Every day | ORAL | Status: DC

## 2011-12-31 NOTE — Progress Notes (Signed)
FLP DONE TODAY Ann [name] 

## 2011-12-31 NOTE — Telephone Encounter (Signed)
Patient needs a new rx for her blood pressure medicine called in to [facility].  She is supposed to be taking 2 a day and the last rx was for only 30 pills instead of 60 pills.

## 2011-12-31 NOTE — Telephone Encounter (Signed)
Rx sent 

## 2012-01-03 ENCOUNTER — Telehealth: Payer: Self-pay | Admitting: Family Medicine

## 2012-01-03 MED ORDER — ATORVASTATIN CALCIUM 20 MG PO TABS: 20.0000 mg | ORAL_TABLET | Freq: Every day | ORAL | Status: DC

## 2012-01-03 NOTE — Telephone Encounter (Signed)
Called and spoke with Ms. Hollingworth's daughter, Macy Mis, and let her know Ozelle's cholesterol was elevated.  She told me at the last visit that at one time her mom had been on a cholesterol medication, but had not had a bad reaction to the medication, she can't remember why it was stopped.  She re-confirmed this over the phone today.  Will start atorvastatin 20 po qhs, Rx sent to pharmacy.  I asked Beuhla to have her mom follow up in 1-2 months for her cholesterol.   Allianna Beaubien 01/03/2012 10:10 AM

## 2012-01-03 NOTE — Telephone Encounter (Signed)
Message copied by Ardyth Gal on Mon Jan 03, 2012 10:04 AM ------      Message from: Denny Levy L      Created: Mon Jan 03, 2012  9:04 AM                   ----- Message -----         From: [facility]         Sent: 12/31/2011  11:54 PM           To: Nestor Ramp, MD

## 2012-02-03 ENCOUNTER — Other Ambulatory Visit: Payer: Self-pay | Admitting: *Deleted

## 2012-02-03 MED ORDER — HALOPERIDOL 0.5 MG PO TABS: 0.5000 mg | ORAL_TABLET | Freq: Three times a day (TID) | ORAL | Status: DC | PRN

## 2012-03-10 ENCOUNTER — Other Ambulatory Visit: Payer: Self-pay | Admitting: *Deleted

## 2012-03-13 MED ORDER — ASPIRIN-DIPYRIDAMOLE ER 25-200 MG PO CP12: 1.0000 | ORAL_CAPSULE | Freq: Two times a day (BID) | ORAL | Status: DC

## 2012-03-29 ENCOUNTER — Telehealth: Payer: Self-pay | Admitting: Family Medicine

## 2012-03-29 NOTE — Telephone Encounter (Signed)
Pt's daughter needs FLMA paperwork completed for her, because she is the caretaker. Paper work placed in [employer] for completions.

## 2012-04-03 NOTE — Telephone Encounter (Signed)
Called and left Beulah a message- I wanted to touch base about what is going on with her mother that she is missing work so I can be more specific when I fill out the FMLA form.  Asked her to call back with specifics.   Fontaine Hehl 04/03/2012

## 2012-04-06 NOTE — Telephone Encounter (Signed)
Called again and spoke with Ann [name]'s daughter.  She has FMLA paperwork filled out every year because when her mom has seizures she often has to stay home from work and care from her. Ann [name] had two seizures on Saturday, so her daughter is planning on bringing her in for a visit soon.   I told her I would fill out the [facility] paperwork and we would call to notify her when it was ready.

## 2012-04-06 NOTE — Telephone Encounter (Signed)
Notified that FMLA is ready for pick up. FMLA papers also scanned.

## 2012-04-13 ENCOUNTER — Other Ambulatory Visit: Payer: Self-pay | Admitting: *Deleted

## 2012-04-13 MED ORDER — MEMANTINE HCL 5 MG PO TABS: 5.0000 mg | ORAL_TABLET | Freq: Every day | ORAL | Status: DC

## 2012-04-29 ENCOUNTER — Other Ambulatory Visit: Payer: Self-pay | Admitting: Family Medicine

## 2012-05-11 ENCOUNTER — Encounter: Payer: Self-pay | Admitting: Family Medicine

## 2012-05-11 ENCOUNTER — Ambulatory Visit (INDEPENDENT_AMBULATORY_CARE_PROVIDER_SITE_OTHER): Payer: Medicare Other | Admitting: Family Medicine

## 2012-05-11 VITALS — BP 182/112 | HR 89 | Temp 97.6°F | Ht 62.0 in | Wt 145.0 lb

## 2012-05-11 DIAGNOSIS — R569 Unspecified convulsions: Secondary | ICD-10-CM

## 2012-05-11 DIAGNOSIS — I10 Essential (primary) hypertension: Secondary | ICD-10-CM

## 2012-05-11 MED ORDER — AMLODIPINE BESYLATE 10 MG PO TABS: 10.0000 mg | ORAL_TABLET | Freq: Every day | ORAL | Status: DC

## 2012-05-11 NOTE — Patient Instructions (Signed)
It was good to see you.  Your blood pressure today was BP: 182/112 mmHg.  Remember your goal blood pressure is about 130/80.  Please be sure to take your medication every day.    I am adding another blood pressure medication, called amlodipine, please take it once a day. Please keep track of your blood pressure at home, and if it is frequently above 150/90, please call the office and make an appointment.   I am making a referral for you to see a Neurologist, the office will call you with your appointment.

## 2012-05-12 ENCOUNTER — Encounter: Payer: Self-pay | Admitting: Family Medicine

## 2012-05-12 NOTE — Progress Notes (Signed)
  Subjective:    Patient ID: Ann [name], female    DOB: 03/16/1927, 78 y.o.   MRN: [id number]  HPI  Ms Ann [name] comes in today after having a seizure this morning.  Her daughter has brought her.  She says this seizure lasted about 10 minutes.  She says she gave [name] her carbamazepine.  She has seemed sleepy but otherwise herself since the seizure.  Her daughter says that this is her second seizure in 4 weeks, which is an increased frequency from the past few years.  They have not seen a neurologist in several years, but her daughter is worried they are becoming more frequent.    She took both of her blood pressure medications today- HCTZ and metoprolol.  She denies any vision changes, chest pain, dizziness, LE edema.  They have a BP cuff at home but have not been checking her blood pressure.    Past Medical History  Diagnosis Date  . Hypertension   . Dementia   . Hyperlipidemia   . Seizures    History  Substance Use Topics  . Smoking status: Never Smoker   . Smokeless tobacco: Never Used  . Alcohol Use: Yes     Review of Systems See HPI    Objective:   Physical Exam BP 182/112  Pulse 89  Temp 97.6 F (36.4 C) (Oral)  Ht 5\' 2"  (1.575 m)  Wt 145 lb (65.772 kg)  BMI 26.52 kg/m2 General appearance: alert, cooperative and no distress Eyes: conjunctivae/corneas clear. PERRL, EOM's intact. Fundi benign. Neck: no adenopathy, no carotid bruit, no JVD, supple, symmetrical, trachea midline and thyroid not enlarged, symmetric, no tenderness/mass/nodules Lungs: clear to auscultation bilaterally Heart: regular rate and rhythm, S1, S2 normal, no murmur, click, rub or gallop Neurologic: Cranial nerves: normal       Assessment & Plan:

## 2012-05-12 NOTE — Assessment & Plan Note (Signed)
Will add amlodipine to her medication regimen, stressed importance of taking all 3 medications daily.  I have asked her daughter to check daily BP's at home, and to call for an office visit if it is frequently above 150/90.  Daughter voices understnding.

## 2012-05-12 NOTE — Assessment & Plan Note (Signed)
Patient has long history- but increased frequency lately.  Will refer back to Neurology for medication management.

## 2012-07-18 ENCOUNTER — Other Ambulatory Visit: Payer: Self-pay | Admitting: Family Medicine

## 2012-07-18 ENCOUNTER — Other Ambulatory Visit: Payer: Self-pay | Admitting: *Deleted

## 2012-07-19 MED ORDER — METOPROLOL SUCCINATE ER 50 MG PO TB24: 100.0000 mg | ORAL_TABLET | Freq: Every day | ORAL | Status: DC

## 2012-08-10 ENCOUNTER — Other Ambulatory Visit: Payer: Self-pay | Admitting: Neurology

## 2012-08-10 DIAGNOSIS — F0280 Dementia in other diseases classified elsewhere without behavioral disturbance: Secondary | ICD-10-CM

## 2012-08-10 DIAGNOSIS — G40319 Generalized idiopathic epilepsy and epileptic syndromes, intractable, without status epilepticus: Secondary | ICD-10-CM

## 2012-08-17 ENCOUNTER — Other Ambulatory Visit: Payer: Self-pay | Admitting: *Deleted

## 2012-08-17 MED ORDER — HYDROCHLOROTHIAZIDE 25 MG PO TABS: 25.0000 mg | ORAL_TABLET | Freq: Every day | ORAL | Status: DC

## 2012-08-25 ENCOUNTER — Ambulatory Visit
Admission: RE | Admit: 2012-08-25 | Discharge: 2012-08-25 | Disposition: A | Discharge status: Home / Self Care | Payer: Self-pay | Source: Ambulatory Visit | Attending: Neurology | Admitting: Neurology

## 2012-08-25 DIAGNOSIS — F0280 Dementia in other diseases classified elsewhere without behavioral disturbance: Secondary | ICD-10-CM

## 2012-08-25 DIAGNOSIS — G309 Alzheimer's disease, unspecified: Secondary | ICD-10-CM

## 2012-08-25 DIAGNOSIS — G40319 Generalized idiopathic epilepsy and epileptic syndromes, intractable, without status epilepticus: Secondary | ICD-10-CM

## 2012-08-25 MED ORDER — GADOBENATE DIMEGLUMINE 529 MG/ML IV SOLN
13.0000 mL | Freq: Once | INTRAVENOUS | Status: AC | PRN
  Administered 2012-08-25: 13 mL via INTRAVENOUS

## 2012-08-28 ENCOUNTER — Other Ambulatory Visit: Payer: Self-pay | Admitting: *Deleted

## 2012-09-01 ENCOUNTER — Other Ambulatory Visit: Payer: Self-pay | Admitting: Family Medicine

## 2012-09-01 MED ORDER — HALOPERIDOL 0.5 MG PO TABS: 0.5000 mg | ORAL_TABLET | Freq: Three times a day (TID) | ORAL | Status: DC | PRN

## 2012-09-14 ENCOUNTER — Other Ambulatory Visit: Payer: Self-pay | Admitting: Family Medicine

## 2012-09-22 ENCOUNTER — Encounter: Payer: Self-pay | Admitting: Family Medicine

## 2012-09-22 ENCOUNTER — Ambulatory Visit (INDEPENDENT_AMBULATORY_CARE_PROVIDER_SITE_OTHER): Payer: Medicare Other | Admitting: Family Medicine

## 2012-09-22 VITALS — BP 139/100 | HR 100 | Ht 62.0 in | Wt 148.8 lb

## 2012-09-22 DIAGNOSIS — R569 Unspecified convulsions: Secondary | ICD-10-CM

## 2012-09-22 DIAGNOSIS — R300 Dysuria: Secondary | ICD-10-CM

## 2012-09-22 DIAGNOSIS — N390 Urinary tract infection, site not specified: Secondary | ICD-10-CM | POA: Insufficient documentation

## 2012-09-22 LAB — POCT URINALYSIS DIPSTICK
Ketones, UA: NEGATIVE
Protein, UA: 30
Spec Grav, UA: 1.02
pH, UA: 7

## 2012-09-22 LAB — POCT UA - MICROSCOPIC ONLY

## 2012-09-22 MED ORDER — CEPHALEXIN 500 MG PO CAPS: 500.0000 mg | ORAL_CAPSULE | Freq: Two times a day (BID) | ORAL | Status: DC

## 2012-09-22 NOTE — Patient Instructions (Addendum)
It was nice to meet you today!  It looks like you might have a little bit of an infection in your urine. I am sending in a prescription for an antibiotic. You will take this for one week, twice per day.  Call the neurologist to try and get back in for the seizures.  If you have any change in mental status, confusion, or fevers, come back to see [place] or go to the ER. You can always call the clinic with any questions.  Be well, Dr. Pollie Meyer

## 2012-09-23 NOTE — Assessment & Plan Note (Signed)
Daughter reports that pt had a seizure yesterday that fit her known pattern. Did not hit head. Have advised that they call pt's neurologist to be scheduled for a f/u appointment. Daughter agrees to this plan and will call to schedule that appointment. Precepted with Dr. Earnest [name].

## 2012-09-23 NOTE — Progress Notes (Signed)
Patient ID: Ann [name], female   DOB: 03/16/1927, 79 y.o.   MRN: [id number]  CC: Ann [name] is a 79 y.o. female here to get a flu shot and be evaluated for a UTI. She is accompanied by her daughter.  HPI:  Daughter would like pt checked for a UTI. Pt's granddaughter is a Psychologist, sport and exercise and recommended that pt get checked for UTI when she came in for her flu shot today since she had a seizure yesterday. Pt has a known seizure d/o and is on carbamazepine, followed by Dr. Pearlean Brownie. Has sz about every 1-2 months. She is supervised all the time by family members. Had a seizure yesterday that fit her typical pattern. Recently had an MRI in December but hasn't been back to neuro since then. Daughter is not sure when she has a f/u appt.  Pt denies dysuria, abdominal pain. She is incontinent at baseline and wears depends. Denies being in any pain currently. No fevers or chills. Has baseline dementia but per daughter, has not had altered mental status beyond her baseline.  ROS: See HPI  PHYSICAL EXAM: BP 139/100  Pulse 100  Ht 5\' 2"  (1.575 m)  Wt 148 lb 12.8 oz (67.495 kg)  BMI 27.22 kg/m2 Gen: NAD, pleasant Heart: RRR Lungs: CTAB Neuro: nonfocal, appears mildly demented, speech intact, no seizure activity Abd: soft, nontender, nondistended

## 2012-09-23 NOTE — Assessment & Plan Note (Addendum)
Pt is asymptomatic, but dementia may limit her ability to accurately report symptoms. No signs of systemic illness. UA suggestive of UTI - loaded bacteria with some whites in a relatively clean catch. Will rx 7 day course of keflex and send urine for culture to ensure adequate coverage. Discussed warning signs to prompt return with daughter - per AVS.

## 2012-09-25 LAB — URINE CULTURE

## 2012-09-29 ENCOUNTER — Other Ambulatory Visit: Payer: Self-pay | Admitting: *Deleted

## 2012-09-29 MED ORDER — ASPIRIN-DIPYRIDAMOLE ER 25-200 MG PO CP12: 1.0000 | ORAL_CAPSULE | Freq: Two times a day (BID) | ORAL | Status: DC

## 2013-02-15 ENCOUNTER — Other Ambulatory Visit: Payer: Self-pay | Admitting: *Deleted

## 2013-02-18 ENCOUNTER — Encounter (HOSPITAL_COMMUNITY): Payer: Self-pay | Admitting: Emergency Medicine

## 2013-02-18 ENCOUNTER — Emergency Department (HOSPITAL_COMMUNITY): Payer: Medicare Other

## 2013-02-18 ENCOUNTER — Inpatient Hospital Stay (HOSPITAL_COMMUNITY)
Admission: EM | Admit: 2013-02-18 | Discharge: 2013-02-22 | DRG: 299 | Disposition: A | Discharge status: Home Health | Payer: Medicare Other | Attending: Family Medicine | Admitting: Family Medicine

## 2013-02-18 DIAGNOSIS — Z79899 Other long term (current) drug therapy: Secondary | ICD-10-CM

## 2013-02-18 DIAGNOSIS — Q828 Other specified congenital malformations of skin: Secondary | ICD-10-CM

## 2013-02-18 DIAGNOSIS — I82409 Acute embolism and thrombosis of unspecified deep veins of unspecified lower extremity: Principal | ICD-10-CM | POA: Diagnosis present

## 2013-02-18 DIAGNOSIS — N390 Urinary tract infection, site not specified: Secondary | ICD-10-CM | POA: Diagnosis present

## 2013-02-18 DIAGNOSIS — F0390 Unspecified dementia without behavioral disturbance: Secondary | ICD-10-CM | POA: Diagnosis present

## 2013-02-18 DIAGNOSIS — R7989 Other specified abnormal findings of blood chemistry: Secondary | ICD-10-CM

## 2013-02-18 DIAGNOSIS — E7800 Pure hypercholesterolemia, unspecified: Secondary | ICD-10-CM | POA: Diagnosis present

## 2013-02-18 DIAGNOSIS — E876 Hypokalemia: Secondary | ICD-10-CM | POA: Diagnosis present

## 2013-02-18 DIAGNOSIS — I2699 Other pulmonary embolism without acute cor pulmonale: Secondary | ICD-10-CM | POA: Diagnosis present

## 2013-02-18 DIAGNOSIS — Z8673 Personal history of transient ischemic attack (TIA), and cerebral infarction without residual deficits: Secondary | ICD-10-CM

## 2013-02-18 DIAGNOSIS — I82401 Acute embolism and thrombosis of unspecified deep veins of right lower extremity: Secondary | ICD-10-CM

## 2013-02-18 DIAGNOSIS — F068 Other specified mental disorders due to known physiological condition: Secondary | ICD-10-CM

## 2013-02-18 DIAGNOSIS — E785 Hyperlipidemia, unspecified: Secondary | ICD-10-CM | POA: Diagnosis present

## 2013-02-18 DIAGNOSIS — I6789 Other cerebrovascular disease: Secondary | ICD-10-CM

## 2013-02-18 DIAGNOSIS — G40909 Epilepsy, unspecified, not intractable, without status epilepticus: Secondary | ICD-10-CM | POA: Diagnosis present

## 2013-02-18 DIAGNOSIS — I10 Essential (primary) hypertension: Secondary | ICD-10-CM

## 2013-02-18 DIAGNOSIS — Z7982 Long term (current) use of aspirin: Secondary | ICD-10-CM

## 2013-02-18 DIAGNOSIS — M7989 Other specified soft tissue disorders: Secondary | ICD-10-CM

## 2013-02-18 DIAGNOSIS — I5021 Acute systolic (congestive) heart failure: Secondary | ICD-10-CM

## 2013-02-18 DIAGNOSIS — R799 Abnormal finding of blood chemistry, unspecified: Secondary | ICD-10-CM | POA: Diagnosis present

## 2013-02-18 DIAGNOSIS — N179 Acute kidney failure, unspecified: Secondary | ICD-10-CM

## 2013-02-18 DIAGNOSIS — R569 Unspecified convulsions: Secondary | ICD-10-CM

## 2013-02-18 HISTORY — DX: Fracture of unspecified metatarsal bone(s), unspecified foot, initial encounter for closed fracture: S92.309A

## 2013-02-18 LAB — URINE MICROSCOPIC-ADD ON

## 2013-02-18 LAB — CBC WITH DIFFERENTIAL/PLATELET
Eosinophils Absolute: 0 10*3/uL (ref 0.0–0.7)
Eosinophils Relative: 0 % (ref 0–5)
Hemoglobin: 12.7 g/dL (ref 12.0–15.0)
Lymphs Abs: 1 10*3/uL (ref 0.7–4.0)
MCH: 31.2 pg (ref 26.0–34.0)
MCV: 90.2 fL (ref 78.0–100.0)
Monocytes Relative: 13 % — ABNORMAL HIGH (ref 3–12)
RBC: 4.07 MIL/uL (ref 3.87–5.11)

## 2013-02-18 LAB — POCT I-STAT, CHEM 8
Calcium, Ion: 1.13 mmol/L (ref 1.13–1.30)
Chloride: 105 mEq/L (ref 96–112)
Creatinine, Ser: 2.3 mg/dL — ABNORMAL HIGH (ref 0.50–1.10)
Glucose, Bld: 138 mg/dL — ABNORMAL HIGH (ref 70–99)
HCT: 40 % (ref 36.0–46.0)

## 2013-02-18 LAB — URINALYSIS, ROUTINE W REFLEX MICROSCOPIC
Glucose, UA: NEGATIVE mg/dL
Ketones, ur: 15 mg/dL — AB
pH: 5 (ref 5.0–8.0)

## 2013-02-18 LAB — CARBAMAZEPINE LEVEL, TOTAL: Carbamazepine Lvl: 7.1 ug/mL (ref 4.0–12.0)

## 2013-02-18 MED ORDER — HYDROCHLOROTHIAZIDE 25 MG PO TABS: 25.0000 mg | ORAL_TABLET | Freq: Every day | ORAL | Status: DC

## 2013-02-18 MED ORDER — HEPARIN BOLUS VIA INFUSION
4000.0000 [IU] | Freq: Once | INTRAVENOUS | Status: AC
  Administered 2013-02-18: 4000 [IU] via INTRAVENOUS

## 2013-02-18 MED ORDER — SODIUM CHLORIDE 0.9 % IV BOLUS (SEPSIS)
500.0000 mL | Freq: Once | INTRAVENOUS | Status: AC
  Administered 2013-02-18: 500 mL via INTRAVENOUS

## 2013-02-18 MED ORDER — SODIUM CHLORIDE 0.9 % IV BOLUS (SEPSIS): 1000.0000 mL | Freq: Once | INTRAVENOUS | Status: DC

## 2013-02-18 MED ORDER — SODIUM CHLORIDE 0.9 % IV SOLN
Freq: Once | INTRAVENOUS | Status: AC
  Administered 2013-02-18: 16:00:00 via INTRAVENOUS

## 2013-02-18 MED ORDER — ACETAMINOPHEN 325 MG PO TABS: 650.0000 mg | ORAL_TABLET | Freq: Four times a day (QID) | ORAL | Status: DC | PRN

## 2013-02-18 MED ORDER — CARBAMAZEPINE ER 200 MG PO TB12
300.0000 mg | ORAL_TABLET | Freq: Two times a day (BID) | ORAL | Status: DC
  Administered 2013-02-19 – 2013-02-22 (×8): 300 mg via ORAL
  Filled 2013-02-18 (×9): qty 1

## 2013-02-18 MED ORDER — ATORVASTATIN CALCIUM 20 MG PO TABS
20.0000 mg | ORAL_TABLET | Freq: Every day | ORAL | Status: DC
  Administered 2013-02-19 – 2013-02-22 (×4): 20 mg via ORAL
  Filled 2013-02-18 (×4): qty 1

## 2013-02-18 MED ORDER — SODIUM CHLORIDE 0.9 % IJ SOLN: 3.0000 mL | Freq: Two times a day (BID) | INTRAMUSCULAR | Status: DC

## 2013-02-18 MED ORDER — MEMANTINE HCL 5 MG PO TABS
5.0000 mg | ORAL_TABLET | Freq: Two times a day (BID) | ORAL | Status: DC
  Administered 2013-02-19 – 2013-02-22 (×8): 5 mg via ORAL
  Filled 2013-02-18 (×9): qty 1

## 2013-02-18 MED ORDER — AMLODIPINE BESYLATE 10 MG PO TABS
10.0000 mg | ORAL_TABLET | Freq: Every day | ORAL | Status: DC
  Administered 2013-02-19 – 2013-02-22 (×4): 10 mg via ORAL
  Filled 2013-02-18 (×4): qty 1

## 2013-02-18 MED ORDER — SODIUM CHLORIDE 0.9 % IV SOLN
INTRAVENOUS | Status: DC
Start: 2013-02-18 — End: 2013-02-22
  Administered 2013-02-18 – 2013-02-19 (×2): via INTRAVENOUS
  Administered 2013-02-20: 75 mL via INTRAVENOUS
  Administered 2013-02-21: 09:00:00 via INTRAVENOUS

## 2013-02-18 MED ORDER — ASPIRIN 81 MG PO CHEW
81.0000 mg | CHEWABLE_TABLET | Freq: Every day | ORAL | Status: DC
  Administered 2013-02-19 – 2013-02-22 (×4): 81 mg via ORAL
  Filled 2013-02-18 (×4): qty 1

## 2013-02-18 MED ORDER — METOPROLOL SUCCINATE ER 100 MG PO TB24
100.0000 mg | ORAL_TABLET | Freq: Two times a day (BID) | ORAL | Status: DC
  Administered 2013-02-19 – 2013-02-22 (×8): 100 mg via ORAL
  Filled 2013-02-18 (×10): qty 1

## 2013-02-18 MED ORDER — LORAZEPAM 2 MG/ML IJ SOLN: 1.0000 mg | Freq: Four times a day (QID) | INTRAMUSCULAR | Status: AC | PRN

## 2013-02-18 MED ORDER — ASPIRIN 81 MG PO CHEW
162.0000 mg | CHEWABLE_TABLET | Freq: Once | ORAL | Status: AC
  Administered 2013-02-18: 162 mg via ORAL
  Filled 2013-02-18: qty 2

## 2013-02-18 MED ORDER — LABETALOL HCL 5 MG/ML IV SOLN: 10.0000 mg | INTRAVENOUS | Status: DC | PRN

## 2013-02-18 MED ORDER — HEPARIN (PORCINE) IN NACL 100-0.45 UNIT/ML-% IJ SOLN
900.0000 [IU]/h | INTRAMUSCULAR | Status: DC
  Administered 2013-02-18: 1000 [IU]/h via INTRAVENOUS
  Administered 2013-02-19: 900 [IU]/h via INTRAVENOUS
  Filled 2013-02-18 (×3): qty 250

## 2013-02-18 MED ORDER — ACETAMINOPHEN 650 MG RE SUPP: 650.0000 mg | Freq: Four times a day (QID) | RECTAL | Status: DC | PRN

## 2013-02-18 MED ORDER — CEPHALEXIN 500 MG PO CAPS
500.0000 mg | ORAL_CAPSULE | Freq: Three times a day (TID) | ORAL | Status: DC
  Administered 2013-02-19 – 2013-02-20 (×5): 500 mg via ORAL
  Filled 2013-02-18 (×8): qty 1

## 2013-02-18 MED ORDER — CARBAMAZEPINE ER 300 MG PO CP12
300.0000 mg | ORAL_CAPSULE | Freq: Two times a day (BID) | ORAL | Status: DC
  Filled 2013-02-18: qty 1

## 2013-02-18 MED ORDER — LORAZEPAM 1 MG PO TABS: 1.0000 mg | ORAL_TABLET | Freq: Four times a day (QID) | ORAL | Status: AC | PRN

## 2013-02-18 MED ORDER — ASPIRIN-DIPYRIDAMOLE ER 25-200 MG PO CP12: 1.0000 | ORAL_CAPSULE | Freq: Two times a day (BID) | ORAL | Status: DC

## 2013-02-18 NOTE — ED Notes (Signed)
Daughter stated, she had a seizure on Wed. But i just keep her at home, and she's been SOB and c/o her feet hurting.

## 2013-02-18 NOTE — ED Notes (Signed)
Patient transported to X-ray 

## 2013-02-18 NOTE — Progress Notes (Signed)
Family Medicine Teaching [facility] Admission History and Physical Service Pager: [phone]  Patient name: Ann [name] Medical record number: [id number] Date of birth: 03/16/1927 Age: 79 y.o. Gender: female  Primary Care Provider: Ardyth Gal, MD  Chief Complaint: shortness of breath  Assessment and Plan: Ann [name] is a 79 y.o. year old female presenting with shortness of breath, found to have subacute DVT of left lower extremity. Also with elevated troponin, BNP, and creatinine.  # DVT/likely PE: DVT diagnosed via LLE doppler, has likely been present for around one month given hx of leg swelling x 1 month and "subacute DVT" on preliminary rdoppler result. Given tachycardia, hypoxia, and tachypnea PE is also very likely (Wells score 7.5). Will not pursue additional imaging to diagnose PE at this time (CTA, V/Q scan) as it will not change our medical management. Patient will need at least 3 months anticoagulation. - admit to stepdown unit for heparin drip and close monitoring - O2 prn to keep sats >92% - will need transitioning to either coumadin or new agent such as xarelto  # Elevated troponin: - likely a result of presumed PE. MI less likely given no chest pain or EKG changes. - on heparin gtt per pharmacy for DVT/PE, which also treats unstable angina/NSTEMI - aspirin 81mg  daily (hold aggrenox given full dose heparin) - continue home beta blocker (has been out of this since last week, per daughter) [ ]  will cycle troponins overnight and repeat EKG in AM [ ]  discuss tomorrow when to restart aggrenox - discussed on phone with on call cardiologist who agrees with this plan  # Elevated BNP: - again, likely a result of presumed PE. CXR is clear and lung sounds also clear, does not appear clinically fluid overloaded to suggest CHF - last known echo was in 2006 and had EF 55-65% - continue to monitor lung exam and respiratory status  # UTI: - UA/micro suggestive of UTI  (leukocytes, bacteria) - treat with keflex 500mg  PO TID x 7 days [ ]  f/u urine cx results   # AKI: currently 2.3, baseline appears to be around 1.1-1.3 (has not had labs since February 2013) - most likely prerenal as daughter says pt does not always drink enough water, but could also be intrarenal as UA suggestive of UTI.  - hold HCTZ until creatinine normalizes - gently hydrate overnight with NS @ 75 cc/hr (do not want to give more aggressive hydration as BNP is elevated) [ ]  check FENa [ ]  recheck BMET this evening (since will be drawing troponin) and again in AM  # HTN: - continue home metoprolol, amlodipine [ ]  hold HCTZ in setting of AKI, likely restart once creatinine improved  # Seizure disorder: - continue home carbamazepine  # HLD:  - continue home lipitor  # Dementia: - continue home namenda  # Alcohol use: - Per daughter, pt drinks at most one beer per day, and doesn't have one every day.  - unlikely to withdraw from this amount of alcohol but will monitor clinically for signs of withdrawal with CIWA protocol  # FEN/GI: - heart healthy diet - NS @75  cc/hr  # Prophylaxis: - on therapeutic anticoagulation (heparin gtt)  # Dispo:  - pending clinical improvement, establishing PO regimen of anticoagulation  # Code Status: Full code. Discussed with daughter upon admission, who states that her mother has previously signed paperwork (prior to her worsening dementia) stating that she would want everything done (full code). Daughter agrees with making pt full  code. We may need to revisit goals of care discussion with family if patient clinically worsens.   History of Present Illness: Ann [name] is a 79 y.o. year old female presenting with shortness of breath and weakness.  Note: patient has dementia and is uncooperative with history (level 5 caveat). History obtained from pt's daughter, who is at bedside.  Daughter reports pt has been short of breath for 1-2 months,  which she thought was due to a cold, so daughter gave her mucinex. Pt has a seizure disorder at baseline and on Wednesday had a seizure but daughter did not call EMS because she wanted to see if pt would have further seizures. Thursday and Friday pt started to act more short of breath.  About one month ago, daughter started noticing left lower extremity swelling, which she thought was from eating salty food. Pt has not acted like it's been sore. She does complain of pain on her right foot fifth toe, where she has previously had a toe amputation.  At her baseline, patient is able to walk around, dress, feed, and bathe herself (although daughter has to help her into the tub). She does not cook. She lives at home with her daughter and does not receive any home health services. She does have to be frequently reminded to drink water. She has not had problems historically with falls while walking, although she did fall out of a chair "not too long ago".  Review Of Systems: Per HPI. Otherwise 12 point review of systems was performed and was unremarkable.  Patient Active Problem List   Diagnosis Date Noted  . UTI (urinary tract infection) 09/22/2012  . Shoulder mass 10/07/2011  . Pneumonia 01/21/2011  . Keratosis of plantar aspect of foot 12/23/2010  . BACK PAIN 12/13/2008  . BUNIONS, BILATERAL 08/06/2008  . HYPERCHOLESTEROLEMIA 11/03/2006  . DEMENTIA, NOT SPECIFIED 11/03/2006  . ALCOHOL ABUSE, UNSPECIFIED 11/03/2006  . TRIGEMINAL NEURALGIA 11/03/2006  . CATARACT 11/03/2006  . TINNITUS 11/03/2006  . HEARING LOSS NOS OR DEAFNESS 11/03/2006  . HYPERTENSION, BENIGN SYSTEMIC 11/03/2006  . CVA 11/03/2006  . GASTROESOPHAGEAL REFLUX, NO ESOPHAGITIS 11/03/2006  . CONSTIPATION 11/03/2006  . CONVULSIONS, SEIZURES, NOS 11/03/2006  . INCONTINENCE, URGE 11/03/2006   Past Medical History: Past Medical History  Diagnosis Date  . Hypertension   . Dementia   . Hyperlipidemia   . Seizures    Past  Surgical History: Past Surgical History  Procedure Laterality Date  . Toe surgery    ? Head surgery at a young age (daughter unclear of details, states it was before she was born)  Home Medications:  No current facility-administered medications on file prior to encounter.   Current Outpatient Prescriptions on File Prior to Encounter  Medication Sig Dispense Refill  . amLODipine (NORVASC) 10 MG tablet Take 1 tablet (10 mg total) by mouth daily.  30 tablet  11  . dipyridamole-aspirin (AGGRENOX) 200-25 MG per 12 hr capsule Take 1 capsule by mouth 2 (two) times daily.  60 capsule  5  . haloperidol (HALDOL) 0.5 MG tablet Take 1 tablet (0.5 mg total) by mouth 3 (three) times daily as needed.  90 tablet  0  . hydrochlorothiazide (HYDRODIURIL) 25 MG tablet Take 1 tablet (25 mg total) by mouth daily.  30 tablet  5  has been out of metoprolol for last week  Social History: History  Substance Use Topics  . Smoking status: Never Smoker   . Smokeless tobacco: Never Used  . Alcohol Use:  Yes  Per daughter, pt drinks maybe one 12 oz beer per day. Doesn't have one every day.  For any additional social history documentation, please refer to relevant sections of EMR.  Family History: No family history on file.  Allergies: No Known Allergies   Physical Exam: BP 127/96  Pulse 103  Temp(Src) 98.1 F (36.7 C) (Oral)  Resp 20  SpO2 96% Exam: General: NAD, lying in bed. Disgruntled at having to be in the hospital. HEENT: NCAT.  Cardiovascular: RRR, no m/r/g Respiratory: NWOB, CTAB Abdomen: soft, nontender to palpation Extremities: LLE with some swelling >right. LLE nontender to palpation. No warmth or erythema. Right foot with callousing over previously amputated fifth toe. No skin breakdown or signs of infection on either foot. Skin: no rashes noted Neuro: grossly nonfocal. Speech intact. Hard of hearing. Patient oriented to person but does not participate in further orientation questioning  (unclear if due to lack of orientation, or poor effort). Able to sit up in bed with assistance.  Labs and Imaging:  CBC:    Component Value Date/Time   WBC 7.7 02/18/2013 1608   HGB 13.6 02/18/2013 1610   HCT 40.0 02/18/2013 1610   PLT 168 02/18/2013 1608   MCV 90.2 02/18/2013 1608   NEUTROABS 5.7 02/18/2013 1608   LYMPHSABS 1.0 02/18/2013 1608   MONOABS 1.0 02/18/2013 1608   EOSABS 0.0 02/18/2013 1608   BASOSABS 0.0 02/18/2013 1608    Comprehensive Metabolic Panel:    Component Value Date/Time   NA 143 02/18/2013 1610   K 3.3* 02/18/2013 1610   CL 105 02/18/2013 1610   CO2 27 10/29/2011 1603   BUN 51* 02/18/2013 1610   CREATININE 2.30* 02/18/2013 1610   CREATININE 1.36* 10/29/2011 1603   GLUCOSE 138* 02/18/2013 1610   CALCIUM 8.9 10/29/2011 1603   AST 17 06/30/2011 1533   ALT 9 06/30/2011 1533   ALKPHOS 83 06/30/2011 1533   BILITOT 0.6 06/30/2011 1533   PROT 7.3 06/30/2011 1533   ALBUMIN 3.8 06/30/2011 1533     Urinalysis    Component Value Date/Time   COLORURINE ORANGE* 02/18/2013 1654   APPEARANCEUR CLOUDY* 02/18/2013 1654   LABSPEC 1.030 02/18/2013 1654   PHURINE 5.0 02/18/2013 1654   GLUCOSEU NEGATIVE 02/18/2013 1654   HGBUR NEGATIVE 02/18/2013 1654   BILIRUBINUR MODERATE* 02/18/2013 1654   BILIRUBINUR NEG 09/22/2012 1104   KETONESUR 15* 02/18/2013 1654   PROTEINUR 100* 02/18/2013 1654   UROBILINOGEN 1.0 02/18/2013 1654   UROBILINOGEN 1.0 09/22/2012 1104   NITRITE NEGATIVE 02/18/2013 1654   NITRITE POSITIVE 09/22/2012 1104   LEUKOCYTESUR SMALL* 02/18/2013 1654  Urine micro few squams 3-6 WBC, few bacteria, granular casts, amorphous urates/phosphates  POCT troponin 0.11 D-dimer 8.38 proBNP 8732  Carbamazepine level 7.1 (therapeutic)  CXR: no acute findings LLE doppler + for subacute occulsive DVT noted in popliteal and femoral veins of LLE   Levert Feinstein, MD Family Medicine PGY-1 Service Pager [phone]  PGY-3: Patient seen and examined independently.  Available data  reviewed.  Agree with findings, assessment, and plan as outlined by Dr. Pollie Meyer above.  Additionally, lower extremities with mild swelling bilaterally, but LLE is also colder than RLE.  Pulses appreciated with doppler per RN.  Consider further vascular studies if symptoms worsen.  [place], DO 02/18/2013 11:42 PM

## 2013-02-18 NOTE — ED Provider Notes (Addendum)
History     CSN: [id number]  Arrival date & time 02/18/13  1309   First MD Initiated Contact with Patient 02/18/13 1529      Chief Complaint  Patient presents with  . Shortness of Breath    (Consider location/radiation/quality/duration/timing/severity/associated sxs/prior treatment) HPI Comments: LEVEL 5 CAVEAT - DEMENTIA PMHx of Hypertension, Dementia, Hyperlipidemia, Seizures brought in to the ED by the daughter with cc of DIB. Pt has been having exertional dib for the past several weeks, however, it is getting worse. She now walks just al ittle bit, and is noted to be winded. There is no hx of MI, no CHF, no lung dz. Pt has a mild cough, and has not complained of chest pains. No hx of PE, DVT, and no risk factos for the same.   Patient is a 79 y.o. female presenting with shortness of breath. The history is provided by the patient.  Shortness of Breath   Past Medical History  Diagnosis Date  . Hypertension   . Dementia   . Hyperlipidemia   . Seizures     History reviewed. No pertinent past surgical history.  No family history on file.  History  Substance Use Topics  . Smoking status: Never Smoker   . Smokeless tobacco: Never Used  . Alcohol Use: Yes    OB History   Grav Para Term Preterm Abortions TAB SAB Ect Mult Living                  Review of Systems  Unable to perform ROS: Dementia  Respiratory: Positive for shortness of breath.     Allergies  Review of patient's allergies indicates no known allergies.  Home Medications   Current Outpatient Rx  Name  Route  Sig  Dispense  Refill  . amLODipine (NORVASC) 10 MG tablet   Oral   Take 1 tablet (10 mg total) by mouth daily.   30 tablet   11   . atorvastatin (LIPITOR) 20 MG tablet      TAKE ONE (1) TABLET EACH DAY   30 tablet   11     PLEASE RESPOND   . carbamazepine (CARBATROL) 300 MG 12 hr capsule      TAKE ONE CAPSULE TWICE DAILY   60 capsule   2     PLEASE RESPOND   . cephALEXin  (KEFLEX) 500 MG capsule   Oral   Take 1 capsule (500 mg total) by mouth 2 (two) times daily.   14 capsule   0   . dipyridamole-aspirin (AGGRENOX) 200-25 MG per 12 hr capsule   Oral   Take 1 capsule by mouth 2 (two) times daily.   60 capsule   5   . haloperidol (HALDOL) 0.5 MG tablet   Oral   Take 1 tablet (0.5 mg total) by mouth 3 (three) times daily as needed.   90 tablet   0   . hydrochlorothiazide (HYDRODIURIL) 25 MG tablet   Oral   Take 1 tablet (25 mg total) by mouth daily.   30 tablet   5     PLEASE RESPOND THANKS!   . memantine (NAMENDA) 5 MG tablet   Oral   Take 1 tablet (5 mg total) by mouth daily.   30 tablet   5   . metoprolol succinate (TOPROL-XL) 50 MG 24 hr tablet   Oral   Take 2 tablets (100 mg total) by mouth daily. Take with or immediately following a meal.   60 tablet  5     BP 126/89  Pulse 103  Temp(Src) 98.1 F (36.7 C) (Oral)  Resp 16  SpO2 96%  Physical Exam  Nursing note and vitals reviewed. Constitutional: She is oriented to person, place, and time. She appears well-developed and well-nourished.  HENT:  Head: Normocephalic and atraumatic.  Eyes: EOM are normal. Pupils are equal, round, and reactive to light.  Neck: Neck supple. No JVD present.  Cardiovascular: Normal rate, regular rhythm and normal heart sounds.   No murmur heard. Pulmonary/Chest: Effort normal. No respiratory distress. She has no wheezes. She has no rales.  RR - 24 at rest  Abdominal: Soft. She exhibits no distension. There is no tenderness. There is no rebound and no guarding.  Musculoskeletal: She exhibits no edema.  LLE-  Unilateral swelling  Neurological: She is alert and oriented to person, place, and time.  Skin: Skin is warm and dry.    ED Course  Procedures (including critical care time)  Labs Reviewed - No data to display No results found.   No diagnosis found.    MDM   Date: 02/18/2013  Rate: 105  Rhythm: sinus tachycardia  QRS Axis:  normal  Intervals: normal  ST/T Wave abnormalities: nonspecific ST/T changes  Conduction Disutrbances:none  Narrative Interpretation:   Old EKG Reviewed: none available  Differential diagnosis includes: ACS syndrome CHF exacerbation COPD exacerbation Valvular disorder Pericardial effusion Pneumonia Pleural effusion Pulmonary edema PE Anemia DVT  Pt comes in with cc of DIB. Pt is noted to be tachypneic at rest, but in no distress. No primary lung dz, no known CAD. Cardiac risk factors are age, HTN, HL. Pt also has unilateral leg swelling, WELLS score for DVT is 1. We will get a Dimer. Pt has hx of UTI, will check UA. Lung exam shows no signs of overload. No recent echo. Exam showed no marked murmur, buut valvular pathology possible as well.  Will get basic labs and reassess.     Derwood Kaplan, MD 02/18/13 1548  7:33 PM D-dimer elevated, DVT scan is + for right sided DVT. Suspicion or PE high has well. Pt also has AKI. Will need admission. NO GI bleed hx, will start heparin.   Derwood Kaplan, MD 02/18/13 [phone]

## 2013-02-18 NOTE — Progress Notes (Signed)
ANTICOAGULATION CONSULT NOTE - Initial Consult  Pharmacy Consult for heparin Indication: PE/DVT  No Known Allergies  Patient Measurements: Height: 5' 1.81" (157 cm) Weight: 148 lb 13 oz (67.5 kg) IBW/kg (Calculated) : 49.67 Heparin Dosing Weight: 68kg Vital Signs: Temp: 98.1 F (36.7 C) (06/15 1312) Temp src: Oral (06/15 1312) BP: 134/109 mmHg (06/15 1915) Pulse Rate: 47 (06/15 1900)  Labs:  Recent Labs  02/18/13 1608 02/18/13 1610  HGB 12.7 13.6  HCT 36.7 40.0  PLT 168  --   CREATININE  --  2.30*    Estimated Creatinine Clearance: 16.6 ml/min (by C-G formula based on Cr of 2.3).   Medical History: Past Medical History  Diagnosis Date  . Hypertension   . Dementia   . Hyperlipidemia   . Seizures    Assessment: 79 year old female presenting to [facility] with shortness of breath. Dopplers show subacute occlusive DVT noted in popliteal and femoral veins of lower extremity. Orders to start IV heparin for DVT/PE. Baseline CBC within normal limits. No prior history of PE/DVT.  Goal of Therapy:  Heparin level 0.3-0.7 units/ml Monitor platelets by anticoagulation protocol: Yes   Plan:  Give 4000 units bolus x 1 Start heparin infusion at 1000 units/hr Check anti-Xa level in 8 hours and daily while on heparin Continue to monitor H&H and platelets Order INR for tomorrow am.  Sheppard Coil PharmD., BCPS Clinical Pharmacist Pager [phone] 02/18/2013 7:34 PM

## 2013-02-18 NOTE — Progress Notes (Signed)
VASCULAR LAB PRELIMINARY  PRELIMINARY  PRELIMINARY  PRELIMINARY  Left lower extremity venous Doppler completed.    Preliminary report:  There is subacute occlusive DVT noted in the popliteal and femoral veins of the left lower extremity.  Zailen Albarran, RVT 02/18/2013, 7:04 PM

## 2013-02-19 ENCOUNTER — Encounter (HOSPITAL_COMMUNITY): Payer: Self-pay | Admitting: Sports Medicine

## 2013-02-19 DIAGNOSIS — R7989 Other specified abnormal findings of blood chemistry: Secondary | ICD-10-CM

## 2013-02-19 DIAGNOSIS — I10 Essential (primary) hypertension: Secondary | ICD-10-CM

## 2013-02-19 DIAGNOSIS — N179 Acute kidney failure, unspecified: Secondary | ICD-10-CM

## 2013-02-19 DIAGNOSIS — I82409 Acute embolism and thrombosis of unspecified deep veins of unspecified lower extremity: Principal | ICD-10-CM

## 2013-02-19 DIAGNOSIS — I6789 Other cerebrovascular disease: Secondary | ICD-10-CM

## 2013-02-19 LAB — BASIC METABOLIC PANEL
BUN: 47 mg/dL — ABNORMAL HIGH (ref 6–23)
BUN: 43 mg/dL — ABNORMAL HIGH (ref 6–23)
CO2: 27 mEq/L (ref 19–32)
CO2: 25 mEq/L (ref 19–32)
Chloride: 105 mEq/L (ref 96–112)
Chloride: 107 mEq/L (ref 96–112)
Creatinine, Ser: 1.65 mg/dL — ABNORMAL HIGH (ref 0.50–1.10)
Creatinine, Ser: 1.37 mg/dL — ABNORMAL HIGH (ref 0.50–1.10)
GFR calc Af Amer: 40 mL/min — ABNORMAL LOW (ref >90)
Glucose, Bld: 109 mg/dL — ABNORMAL HIGH (ref 70–99)
Glucose, Bld: 112 mg/dL — ABNORMAL HIGH (ref 70–99)
Potassium: 3.2 mEq/L — ABNORMAL LOW (ref 3.5–5.1)

## 2013-02-19 LAB — PROTIME-INR: INR: 1.28 (ref 0.00–1.49)

## 2013-02-19 LAB — URINE CULTURE: Colony Count: NO GROWTH

## 2013-02-19 LAB — HEPARIN LEVEL (UNFRACTIONATED)
Heparin Unfractionated: 0.77 IU/mL — ABNORMAL HIGH (ref 0.30–0.70)
Heparin Unfractionated: 0.64 IU/mL (ref 0.30–0.70)

## 2013-02-19 LAB — CBC
HCT: 34.3 % — ABNORMAL LOW (ref 36.0–46.0)
MCH: 30.6 pg (ref 26.0–34.0)
MCHC: 33.8 g/dL (ref 30.0–36.0)
MCV: 90.5 fL (ref 78.0–100.0)
RDW: 13.5 % (ref 11.5–15.5)

## 2013-02-19 LAB — TROPONIN I: Troponin I: <0.3 ng/mL (ref <0.30)

## 2013-02-19 MED ORDER — WARFARIN SODIUM 5 MG PO TABS
5.0000 mg | ORAL_TABLET | Freq: Once | ORAL | Status: AC
  Administered 2013-02-19: 5 mg via ORAL
  Filled 2013-02-19: qty 1

## 2013-02-19 MED ORDER — METOPROLOL SUCCINATE ER 50 MG PO TB24: 100.0000 mg | ORAL_TABLET | Freq: Every day | ORAL | Status: DC

## 2013-02-19 MED ORDER — WARFARIN - PHARMACIST DOSING INPATIENT
Freq: Every day | Status: DC
  Administered 2013-02-20: 18:00:00

## 2013-02-19 MED ORDER — POTASSIUM CHLORIDE CRYS ER 20 MEQ PO TBCR
40.0000 meq | EXTENDED_RELEASE_TABLET | Freq: Once | ORAL | Status: AC
Start: 2013-02-19 — End: 2013-02-19
  Administered 2013-02-19: 40 meq via ORAL
  Filled 2013-02-19: qty 2

## 2013-02-19 MED ORDER — PATIENT'S GUIDE TO USING COUMADIN BOOK
Freq: Once | Status: AC
  Administered 2013-02-20: 12:00:00
  Filled 2013-02-19: qty 1

## 2013-02-19 NOTE — Progress Notes (Signed)
ANTICOAGULATION CONSULT NOTE - Follow-Up Consult  Pharmacy Consult for heparin Indication: PE/DVT  No Known Allergies  Patient Measurements: Height: 5\' 2"  (157.5 cm) Weight: 148 lb 2.4 oz (67.2 kg) IBW/kg (Calculated) : 50.1 Heparin Dosing Weight: 67kg  Vital Signs: Temp: 98.3 F (36.8 C) (06/16 0402) Temp src: Oral (06/16 0402) BP: 129/101 mmHg (06/16 0402) Pulse Rate: 84 (06/16 0402)  Labs:  Recent Labs  02/18/13 1608 02/18/13 1610 02/18/13 2315 02/18/13 2318 02/19/13 0450  HGB 12.7 13.6  --   --  11.6*  HCT 36.7 40.0  --   --  34.3*  PLT 168  --   --   --  160  LABPROT  --   --   --   --  15.7*  INR  --   --   --   --  1.28  HEPARINUNFRC  --   --   --   --  0.77*  CREATININE  --  2.30*  --  1.65* 1.37*  TROPONINI  --   --  <0.30  --  <0.30    Estimated Creatinine Clearance: 27.9 ml/min (by C-G formula based on Cr of 1.37).   Assessment: 79 year old female presenting to [facility] with shortness of breath. Dopplers show subacute occlusive DVT noted in popliteal and femoral veins of lower extremity. No further w/u for PE per MD note as will not change medical management. Pt with supratherapeutic heparin level (0.77) on 1000 units/hr.  Hgb trending down - will watch. No bleeding noted.  Goal of Therapy:  Heparin level 0.3-0.7 units/ml Monitor platelets by anticoagulation protocol: Yes   Plan:  1. Decrease heparin to 900 units/hr 2. F/u 8 hr heparin level 3. F/u plans for oral anticoagulation start  Christoper Fabian, PharmD, BCPS Clinical pharmacist, pager [phone] 02/19/2013 5:58 AM

## 2013-02-19 NOTE — Progress Notes (Signed)
FMTS Attending Daily Note: Denny Levy MD [phone] pager office [phone] I  Have seen and examined this patient, reviewed their chart. I have discussed this patient with the resident. I agree with the resident's findings, assessment and care plan. Legs seem relatively same size to me. Known DVT, unclear whether subacute r acute. Do not thinkl CTA would change management. She seems very comfortable on my exam today.

## 2013-02-19 NOTE — Progress Notes (Signed)
Pt transferred from ED on stretcher with RN, pt HOH and alert to self only but follows commands. Pt VVS, pedal pulses dopplerable, family updated. Will continue to monitor.

## 2013-02-19 NOTE — Progress Notes (Signed)
FMTS Attending Admission Note: Valma Rotenberg,MD I  have seen and examined this patient, reviewed their chart. I have discussed this patient with the resident. I agree with the resident's findings, assessment and care plan.  

## 2013-02-19 NOTE — Progress Notes (Signed)
Utilization review completed.  

## 2013-02-19 NOTE — Progress Notes (Signed)
ANTICOAGULATION CONSULT NOTE - Follow-Up Consult  Pharmacy Consult for Heparin and Coumadin Indication: PE/DVT  No Known Allergies  Patient Measurements: Height: 5\' 2"  (157.5 cm) Weight: 148 lb 2.4 oz (67.2 kg) IBW/kg (Calculated) : 50.1 Heparin Dosing Weight: 67kg  Vital Signs: Temp: 98.4 F (36.9 C) (06/16 1100) Temp src: Oral (06/16 1100) BP: 131/85 mmHg (06/16 1040) Pulse Rate: 90 (06/16 1040)  Labs:  Recent Labs  02/18/13 1608 02/18/13 1610 02/18/13 2315 02/18/13 2318 02/19/13 0450 02/19/13 0955 02/19/13 1451  HGB 12.7 13.6  --   --  11.6*  --   --   HCT 36.7 40.0  --   --  34.3*  --   --   PLT 168  --   --   --  160  --   --   LABPROT  --   --   --   --  15.7*  --   --   INR  --   --   --   --  1.28  --   --   HEPARINUNFRC  --   --   --   --  0.77*  --  0.64  CREATININE  --  2.30*  --  1.65* 1.37*  --   --   TROPONINI  --   --  <0.30  --  <0.30 <0.30  --     Estimated Creatinine Clearance: 27.9 ml/min (by C-G formula based on Cr of 1.37).   Assessment: 79 year old female on heparin and now coumadin for DVT noted in popliteal and femoral veins of lower extremity. This is overlap day 1.  Heparin level now at goal.  No bleeding noted.  Baseline INR=1.28.    Goal of Therapy:  Heparin level 0.3-0.7 units/ml Monitor platelets by anticoagulation protocol: Yes INR=2-3   Plan:  Continue heparin at 900 units/hr Daily heparin level/cbc Coumadin 5mg  po x 1 dose tonight. Daily PT/INR.  Wendie Simmer, PharmD, BCPS Clinical Pharmacist  Pager: [phone]

## 2013-02-19 NOTE — Progress Notes (Signed)
txing to 3w report called to [facility], pt moved in w/c - VSS

## 2013-02-19 NOTE — Progress Notes (Addendum)
Family Medicine Teaching Service Daily Progress Note Intern Pager: [phone]  Patient name: MAICEE ULLMAN Medical record number: [id number] Date of birth: 03/16/1927 Age: 79 y.o. Gender: female  Primary Care Provider: Ardyth Gal, MD  Subjective: Patient states that she is doing fine. She complains of right toe pain, but denies pain elsewhere. Patient is a somewhat unreliable source given her dementia.   Objective: Temp:  [98.1 F (36.7 C)-98.6 F (37 C)] 98.5 F (36.9 C) (06/16 0703) Pulse Rate:  [47-104] 84 (06/16 0703) Resp:  [16-25] 23 (06/16 0703) BP: (114-149)/(78-109) 125/93 mmHg (06/16 0703) SpO2:  [93 %-100 %] 100 % (06/16 0703) Weight:  [148 lb 2.4 oz (67.2 kg)-148 lb 13 oz (67.5 kg)] 148 lb 2.4 oz (67.2 kg) (06/15 2230)  Exam: General: Patient sitting up in chair, appears comfortable, no acute distress Cardiovascular: Regular rate and rhythm, no murmur heard Respiratory: Normal work of breathing, coarse breath sounds bilaterally, possible diminished sounds in lower left lung Abdomen: No TTP, soft, possibly distended  Extremities: LLE slightly larger than RLE, no TTP, both extremities slightly cool to touch (L>R), prolonged capillary refill, amputated 5th right toe with large callous that is extremely tender to light palpation  Laboratory:  Recent Labs Lab 02/18/13 1608 02/18/13 1610 02/19/13 0450  WBC 7.7  --  5.5  HGB 12.7 13.6 11.6*  HCT 36.7 40.0 34.3*  PLT 168  --  160    Recent Labs Lab 02/18/13 1610 02/18/13 2318 02/19/13 0450  NA 143 142 142  K 3.3* 3.2* 3.2*  CL 105 105 107  CO2  --  27 25  BUN 51* 47* 43*  CREATININE 2.30* 1.65* 1.37*  CALCIUM  --  8.4 8.7  GLUCOSE 138* 109* 112*    Imaging/Diagnostic Tests: 02/19/13 - EKG: Normal sinus, low voltage QRS, inferior infarct of undetermined age  Assessment and Plan: DARIANNA AMY is a 79 y.o. year old female presenting with shortness of breath, found to have subacute DVT of left lower  extremity. Also with elevated troponin, BNP, and creatinine.   # DVT/likely PE: DVT diagnosed via LLE doppler, has likely been present for around one month given hx of leg swelling x 1 month and "subacute DVT" on preliminary rdoppler result. Given tachycardia, hypoxia, and tachypnea PE is also very likely (Wells score 7.5). Will not pursue additional imaging to diagnose PE at this time (CTA, V/Q scan) as it will not change our medical management. Patient will need at least 3 months anticoagulation.  - Transferred to floor on tele today - O2 prn to keep sats >92%  - Patient will be transitioned to warfarin today  # Hypokalemia: Patient was admitted with a K of 3.3. The morning of 6/16 K is at 3.2 Will replete. - Ordered of oral potassium x1  # Elevated troponin: Likely a result of presumed PE. MI less likely given no chest pain or EKG changes. Troponins have been negative x3. Repeat EKG 6/16 AM showed no acute changes. - Continue heparin gtt per pharmacy for DVT/PE, which also treats unstable angina/NSTEMI  - aspirin 81mg  daily (hold aggrenox given full dose heparin)  - continue home beta blocker (has been out of this since last week, per daughter)  [ ]  Continue to hold aggrenox   # Elevated BNP: Likely a result of presumed PE. CXR is clear and lung sounds also clear, does not appear clinically fluid overloaded to suggest CHF. Last known echo was in 2006 and had EF 55-65%  -  Continue to monitor lung exam and respiratory status   # UTI: UA/micro suggestive of UTI (leukocytes, bacteria). Treat with keflex 500mg  PO TID x 7 days. [ ]  F/u urine cx results   # AKI: Cr 2.3 on admission, baseline appears to be around 1.1-1.3 (has not had labs since February 2013) Cr this AM down to 1.37. BUN:Cr is 31:1, suggesting prerenal in agreement with daughter's report of lower fluid intake and improvement with fluid hydration overnight. Will continue fluids and recheck Cr in the AM to ensure improving  trend. - hold HCTZ until creatinine normalizes  [ ]  Recheck BMET tomorrow AM  # HTN:  - continue home metoprolol, amlodipine  - Continue to hold HCTZ in setting of AKI, likely restart once creatinine improved   # Seizure disorder:  - continue home carbamazepine   # HLD:  - continue home lipitor   # Dementia:  - continue home namenda   # Alcohol use:  - Per daughter, pt drinks at most one beer per day, and doesn't have one every day.  - unlikely to withdraw from this amount of alcohol but will monitor clinically for signs of withdrawal with CIWA protocol  FEN/GI: Heart healthy, IVF NS at 75cc/hr PPx: Heparin ggt Dispo: Pending clinical improvement and establishment of PO anticoagulation regimen Code Status: FULL; as discussed with patient's daughter on admission  Kittie Plater, Med Student 02/19/2013, 10:03 AM PGY-1, [place] [facility] FPTS Intern pager: [phone]  R2 Addendum:  I have seen the above patient and have discussed the patient's presentation, history, objective data, physical exam and assessment and plan with the [facility].  Briefly, this patient is a 79 y.o. year old female with worsening dyspnea with presumed PE in setting of subacute LLE DVT.    PE: GENERAL:  Elderly AA  female. In no discomfort; no respiratory distress. PSYCH: pleasant, Alert and appropriately interactive however generally not oriented to place or situation consistent with baseline dementia.  Reports RLE pain.  C   H&N: AT/[place], trachea midline EENT:  MMM, no scleral icterus, EOMi HEART: RRR, S1/S2 heard, no murmur LUNGS: CTA B, no wheezes, no crackles EXTREMITIES: Moves all 4 extremities spontaneously.  Slightly cool.  Pulses by doppler only.  Capillary refill > 5 seconds bilaterally. 1+ pitting edema bilaterally.  R 5th toe s/p amputation with hyperkeratosis over lateral aspect without evidence of erythema or skin breakdown TTP over hyperkeratosis but not over remainder of 5th  metatarsal  A/P:    * DVT/Presumed PE with dyspnea and transient O2 requirement.   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  Hx of base of 5th non-displaced fx.  Will need OP follow up.  * AKI: presumed to be prerenal, improving with hydration [ ]  BMET in AM  * Dementia: Noted to be at baseline at admission, unchanged today - continue Namenda  * HLD - continue statin  * EtOH Use:  Reported 1 beer per day - CIWA   DISPO:  To Floor Tele, plan for PT/OT to eval for safety.  Likely home with Daughter.   Andrena Mews, DO [facility] Family Medicine Resident - PGY-2  02/19/2013 7:21 PM

## 2013-02-20 DIAGNOSIS — F068 Other specified mental disorders due to known physiological condition: Secondary | ICD-10-CM

## 2013-02-20 DIAGNOSIS — L851 Acquired keratosis [keratoderma] palmaris et plantaris: Secondary | ICD-10-CM

## 2013-02-20 DIAGNOSIS — R569 Unspecified convulsions: Secondary | ICD-10-CM

## 2013-02-20 LAB — BASIC METABOLIC PANEL
BUN: 31 mg/dL — ABNORMAL HIGH (ref 6–23)
CO2: 25 mEq/L (ref 19–32)
Calcium: 8.4 mg/dL (ref 8.4–10.5)
Glucose, Bld: 106 mg/dL — ABNORMAL HIGH (ref 70–99)
Sodium: 142 mEq/L (ref 135–145)

## 2013-02-20 LAB — CBC
HCT: 33 % — ABNORMAL LOW (ref 36.0–46.0)
Hemoglobin: 11 g/dL — ABNORMAL LOW (ref 12.0–15.0)
MCH: 30.5 pg (ref 26.0–34.0)
MCHC: 33.3 g/dL (ref 30.0–36.0)
RBC: 3.61 MIL/uL — ABNORMAL LOW (ref 3.87–5.11)

## 2013-02-20 LAB — PROTIME-INR: INR: 1.33 (ref 0.00–1.49)

## 2013-02-20 MED ORDER — ENOXAPARIN SODIUM 100 MG/ML ~~LOC~~ SOLN
1.5000 mg/kg | SUBCUTANEOUS | Status: DC
  Administered 2013-02-20 – 2013-02-22 (×3): 100 mg via SUBCUTANEOUS
  Filled 2013-02-20 (×3): qty 1

## 2013-02-20 MED ORDER — WARFARIN SODIUM 5 MG PO TABS
5.0000 mg | ORAL_TABLET | Freq: Once | ORAL | Status: AC
  Administered 2013-02-20: 5 mg via ORAL
  Filled 2013-02-20: qty 1

## 2013-02-20 NOTE — Progress Notes (Signed)
ANTICOAGULATION CONSULT NOTE - Follow-Up Consult  Pharmacy Consult for Lovenox and Coumadin Indication: PE/DVT  No Known Allergies  Patient Measurements: Height: 5\' 2"  (157.5 cm) Weight: 148 lb 2.4 oz (67.2 kg) IBW/kg (Calculated) : 50.1 Heparin Dosing Weight: 67kg  Vital Signs: Temp: 98.5 F (36.9 C) (06/17 1344) Temp src: Oral (06/17 1344) BP: 105/73 mmHg (06/17 1344) Pulse Rate: 76 (06/17 1344)  Labs:  Recent Labs  02/18/13 1608  02/18/13 1610 02/18/13 2315 02/18/13 2318 02/19/13 0450 02/19/13 0955 02/19/13 1451 02/20/13 0504 02/20/13 1120  HGB 12.7  --  13.6  --   --  11.6*  --   --  11.0*  --   HCT 36.7  --  40.0  --   --  34.3*  --   --  33.0*  --   PLT 168  --   --   --   --  160  --   --  177  --   LABPROT  --   --   --   --   --  15.7*  --   --   --  16.2*  INR  --   --   --   --   --  1.28  --   --   --  1.33  HEPARINUNFRC  --   --   --   --   --  0.77*  --  0.64 0.52  --   CREATININE  --   < > 2.30*  --  1.65* 1.37*  --   --  1.09  --   TROPONINI  --   --   --  <0.30  --  <0.30 <0.30  --   --   --   < > = values in this interval not displayed.  Estimated Creatinine Clearance: 35.1 ml/min (by C-G formula based on Cr of 1.09).   Assessment: 79 year old female on lovenox and coumadin for DVT noted in popliteal and femoral veins of lower extremity. This is overlap day 2/5-heparin changed to lovenox today.   No bleeding noted.  INR is subtherapeutic.  Scr=1.09 and Crcl~15ml/min.    Goal of Therapy:  Xa=0.6-1.2 Monitor platelets by anticoagulation protocol: Yes INR=2-3   Plan: DC heparin and start  lovenox 100mg  SQ daily (1.5mg /kg/dose).  Repeat Coumadin 5mg  po x 1 dose tonight. Daily PT/INR.   Wendie Simmer, PharmD, BCPS Clinical Pharmacist  Pager: [phone]

## 2013-02-20 NOTE — Progress Notes (Signed)
IV Heparin Infusion discontinued per orders. NS IV infusion continues.  Alwyn Ren, RN

## 2013-02-20 NOTE — Evaluation (Signed)
Physical Therapy Evaluation Patient Details Name: Ann [name] MRN: [id number] DOB: 03/16/1927 Today's Date: 02/20/2013 Time: 1478-2956 PT Time Calculation (min): 26 min  PT Assessment / Plan / Recommendation Clinical Impression  Pt admitted and found to have subacute DVT with probable PE.  No further eval of PE due to no change in medical management if it were confirmed.  Pt unable to give clear PLOF, but per chart she lived wiht daughter.  Pt was able to ambulate 20' with MIN A and RW from bed to bathroom and back with SOB noted, but o2 95%.  If daughter canb handle pt at current level then home with 24 S.  If unable to provide 24 hr S, then recommend SNF.  Will follow acutely.    PT Assessment  Patient needs continued PT services    Follow Up Recommendations  Home health PT;SNF;Supervision/Assistance - 24 hour    Does the patient have the potential to tolerate intense rehabilitation      Barriers to Discharge        Equipment Recommendations  Rolling walker with 5" wheels    Recommendations for Other Services     Frequency Min 3X/week    Precautions / Restrictions Precautions Precautions: Fall   Pertinent Vitals/Pain FACES scale 6/10 R foot pain      Mobility  Bed Mobility Bed Mobility: Supine to Sit Supine to Sit: 4: Min assist Transfers Transfers: Sit to Stand;Stand to Sit Sit to Stand: 4: Min assist;From bed;From toilet Stand to Sit: 4: Min assist;To bed;To toilet Ambulation/Gait Ambulation/Gait Assistance: 4: Min assist Ambulation Distance (Feet): 20 Feet Assistive device: Rolling walker Ambulation/Gait Assistance Details: decreased safety with RW. SOB with gait, but o2 95% and HR 92 Gait Pattern: Decreased stance time - right    Exercises     PT Diagnosis: Difficulty walking  PT Problem List: Decreased activity tolerance;Decreased mobility;Decreased cognition;Decreased safety awareness;Decreased knowledge of use of DME PT Treatment Interventions: Gait  training;Functional mobility training;Therapeutic activities;Therapeutic exercise;DME instruction   PT Goals Acute Rehab PT Goals PT Goal Formulation: Patient unable to participate in goal setting Time For Goal Achievement: 02/27/13 Potential to Achieve Goals: Good Pt will go Supine/Side to Sit: with modified independence PT Goal: Supine/Side to Sit - Progress: Goal set today Pt will go Sit to Stand: with modified independence PT Goal: Sit to Stand - Progress: Goal set today Pt will Transfer Bed to Chair/Chair to Bed: with modified independence PT Transfer Goal: Bed to Chair/Chair to Bed - Progress: Goal set today Pt will Ambulate: 51 - 150 feet;with least restrictive assistive device;with supervision PT Goal: Ambulate - Progress: Goal set today  Visit Information  Last PT Received On: 02/20/13 Assistance Needed: +1    Subjective Data  Subjective: My foot hurts   Prior Functioning  Home Living Lives With: Daughter Home Adaptive Equipment: Straight cane Additional Comments: Pt poor historian.  She states she lived with daughter who works.  AMbulated with cane.  (cane in room) Prior Function Comments: unsure of PLOF. Pt staes she ambulated with cane    Cognition  Cognition Overall Cognitive Status: History of cognitive impairments - at baseline    Extremity/Trunk Assessment Right Lower Extremity Assessment RLE ROM/Strength/Tone: [facility] for tasks assessed Left Lower Extremity Assessment LLE ROM/Strength/Tone: [facility] for tasks assessed   Balance    End of Session PT - End of Session Equipment Utilized During Treatment: Gait belt Activity Tolerance: Patient tolerated treatment well Patient left: in bed;with bed alarm set Nurse Communication: Mobility  status  GP     Vertis Bauder LUBECK 02/20/2013, 1:04 PM

## 2013-02-20 NOTE — Progress Notes (Signed)
Family Medicine Teaching Service Daily Progress Note Intern Pager: [phone]  Patient name: Ann [name] Medical record number: [id number] Date of birth: 03/16/1927 Age: 79 y.o. Gender: female  Primary Care Provider: Ardyth Gal, MD  Subjective: Pt has no complaints this morning but does say the pinky toe on her right foot is painful. Unable to get further subjective from pt.  Objective: Temp:  [98.2 F (36.8 C)-98.8 F (37.1 C)] 98.3 F (36.8 C) (06/17 0605) Pulse Rate:  [90] 90 (06/16 1040) Resp:  [20-23] 20 (06/17 0605) BP: (118-145)/(80-99) 141/99 mmHg (06/17 0605) SpO2:  [94 %-96 %] 94 % (06/17 0605)  Exam: General: Patient sitting up in bed, NAD Cardiovascular: RRR Respiratory: mild wheezing, question nasal breathing. Mildly increased work of breathing Extremities: LLE slightly larger than RLE, no TTP, both extremities warm, callous present on right fifth toe  Laboratory:   CBC  Recent Labs Lab 02/18/13 1608 02/18/13 1610 02/19/13 0450 02/20/13 0504  WBC 7.7  --  5.5 4.9  HGB 12.7 13.6 11.6* 11.0*  HCT 36.7 40.0 34.3* 33.0*  PLT 168  --  160 177     BMET  Recent Labs Lab 02/18/13 2318 02/19/13 0450 02/20/13 0504  NA 142 142 142  K 3.2* 3.2* 4.2  CL 105 107 108  CO2 27 25 25   BUN 47* 43* 31*  CREATININE 1.65* 1.37* 1.09  GLUCOSE 109* 112* 106*  CALCIUM 8.4 8.7 8.4      Trop neg x 3 6/16 EKG nonischemic INR 1.28  Assessment and Plan: Ann [name] is a 79 y.o. year old female presenting with shortness of breath, found to have subacute DVT of left lower extremity. Also with elevated troponin, BNP, and creatinine.   # DVT/likely PE: DVT diagnosed via LLE doppler, has likely been present for around one month given hx of leg swelling x 1 month and "subacute DVT" on preliminary doppler result. Given tachycardia, hypoxia, and tachypnea PE is also very likely (Wells score 7.5). Will not pursue additional imaging to diagnose PE at this time (CTA,  V/Q scan) as it will not change our medical management. Patient will need at least 3 months anticoagulation.  - Transferred to floor on tele 6/17 - O2 prn to keep sats >92%  - change heparin drip to lovenox today (daily dosing) - coumadin per pharmacy (no xarelto due to carbamazepine use) - might d/c home today on once daily lovenox and discharge with [facility] RN INR checks - will get PT/OT eval today  # Hypokalemia: s/p repletion, normal today  # Elevated troponin: Likely a result of presumed PE. MI less likely given no chest pain or EKG changes. Troponins have been negative x3. Repeat EKG 6/16 AM showed no acute changes. - aspirin 81mg  daily (hold aggrenox given full anticoagulation)  - continue home beta blocker (has been out of this since last week, per daughter)  [ ]  needs beta blocker rx at d/c  # Elevated BNP: Likely a result of presumed PE. CXR is clear and lung sounds also clear, does not appear clinically fluid overloaded to suggest CHF. Last known echo was in 2006 and had EF 55-65%  - Continue to monitor lung exam and respiratory status   # UTI: UA/micro suggestive of UTI (leukocytes, bacteria). Treat with keflex 500mg  PO TID x 7 days. - urine cx shows no growth - will d/c keflex as UA marginal and cx with no growth  # AKI: Cr 2.3 on admission, baseline appears to be around 1.1-1.3 (  has not had labs since February 2013)  - likely prerenal, creatinine now improved to 1.09 - continue to monitor BMET  # HTN:  - continue home metoprolol, amlodipine  [ ]  restart HCTZ if BP rises, continue holding for now as pt is normotensive (had held due to AKI)  # Seizure disorder:  - continue home carbamazepine   # HLD:  - continue home lipitor   # Dementia:  - continue home namenda   # Alcohol use:  - Per daughter, pt drinks at most one beer per day, and doesn't have one every day.  - unlikely to withdraw from this amount of alcohol but will monitor clinically for signs of withdrawal  with CIWA protocol  FEN/GI: Heart healthy diet, IVF NS at 75cc/hr PPx: heparin drip, switch to lovenox today Dispo: possible d/c today if can get [facility] set up and family amenable to d/c Code Status: FULL; as discussed with patient's daughter on admission  Ann Dodrill, MD 02/20/2013, 8:35 AM PGY-1, [facility] Family Medicine FPTS Intern pager: [phone]

## 2013-02-20 NOTE — H&P (Signed)
FMTS Attending Admission Note: Ann Eniola,MD I  have seen and examined this patient, reviewed their chart. I have discussed this patient with the resident. I agree with the resident's findings, assessment and care plan.  79 y/O Female with pmx of HTN,HLD,Dementia was brought to the ED by her daughter,at the time of my exam her daughter was not by her bed side,patient could not give adequate hx,but mentioned she was having right foot pain and that was why her daughter brought her in,per sign out from the resident she has SOB at home for the last 1-2 months gradually worsening,she also mentioned she noticed left LL swelling,denies any trauma to her legs. Currently she dnies any SOB or chest pain,her only concern is her foot pain.  Exam: Gen: Calm in bed,not in distress. Resp: Air entry equal B/L no crepitations. CV: S1 S2 normal no murmurs. Abd: Benign. Neuro: Awake and alert. Ext, No pedal swelling,toe nails discolored with deformed right 5th toe.  Labs; all reviewed. Creatinine,BNP,TNI,D-dimer elevated,doppler U/S pos for DVT  A/P: 79 Y/O Female with;        1. LLE DVT: PE unlikely due to good O2 saturation,not tachycardic.            I agree with heparin drip            Monitor O2 sat.        2. SOB: Currently asymptomatic,O2 sat stable.            BNP elevated ?? CHF although no hx of CHF,luns are clear,chest xray normal with no effusion.           Recommend ECHO to assess LV function.           On Metoprolol.                 3. Cardiac: Elevated TNI no major EKG changes,repeat TNI resolved to normal,unlikely due to ACS.          4. Kidney insufficiency: No prior hx of kidney dysfunction.            Likely due to poor oral intake,will hydrate patient and monitor creatinine level.         5. HTN: Continue home regimen.

## 2013-02-21 LAB — PROTIME-INR
INR: 1.38 (ref 0.00–1.49)
Prothrombin Time: 16.6 seconds — ABNORMAL HIGH (ref 11.6–15.2)

## 2013-02-21 LAB — GLUCOSE, CAPILLARY

## 2013-02-21 LAB — CBC
HCT: 33 % — ABNORMAL LOW (ref 36.0–46.0)
Hemoglobin: 10.7 g/dL — ABNORMAL LOW (ref 12.0–15.0)
MCH: 30.1 pg (ref 26.0–34.0)
RBC: 3.55 MIL/uL — ABNORMAL LOW (ref 3.87–5.11)

## 2013-02-21 LAB — HEPARIN LEVEL (UNFRACTIONATED): Heparin Unfractionated: 0.43 IU/mL (ref 0.30–0.70)

## 2013-02-21 MED ORDER — ENOXAPARIN (LOVENOX) PATIENT EDUCATION KIT
PACK | Freq: Once | Status: AC
Start: 2013-02-21 — End: 2013-02-21
  Administered 2013-02-21: 18:00:00
  Filled 2013-02-21: qty 1

## 2013-02-21 MED ORDER — WARFARIN SODIUM 5 MG PO TABS
5.0000 mg | ORAL_TABLET | Freq: Once | ORAL | Status: AC
  Administered 2013-02-21: 5 mg via ORAL
  Filled 2013-02-21: qty 1

## 2013-02-21 NOTE — Progress Notes (Signed)
Family Medicine Teaching Service Daily Progress Note Intern Pager: [phone]  Patient name: Ann [name] Medical record number: [id number] Date of birth: 03/16/1927 Age: 79 y.o. Gender: female  Primary Care Provider: Ardyth Gal, MD  Subjective: Patient states that she is doing well this morning. She denies any pain. Subjective assessment limited 2/2 dementia.  Objective: Temp:  [98.1 F (36.7 C)-98.7 F (37.1 C)] 98.1 F (36.7 C) (06/18 0540) Pulse Rate:  [74-92] 74 (06/18 0540) Resp:  [24] 24 (06/17 1344) BP: (105-145)/(73-91) 145/91 mmHg (06/18 0540) SpO2:  [95 %-100 %] 100 % (06/18 0540) Weight:  [154 lb 6.4 oz (70.035 kg)] 154 lb 6.4 oz (70.035 kg) (06/18 0540) Exam: General: Patient is laying comfortably in bed, no acute distress Cardiovascular: Regular rate and rhythm, no murmur heard Respiratory: Difficult to auscultate 2/2 patient talking and upper airway noise transmittance Abdomen: Positive bowel sounds Extremities: LLE slightly larger than RLE, no TTP  Laboratory:  Recent Labs Lab 02/19/13 0450 02/20/13 0504 02/21/13 0535  WBC 5.5 4.9 4.5  HGB 11.6* 11.0* 10.7*  HCT 34.3* 33.0* 33.0*  PLT 160 177 203    Recent Labs Lab 02/18/13 2318 02/19/13 0450 02/20/13 0504  NA 142 142 142  K 3.2* 3.2* 4.2  CL 105 107 108  CO2 27 25 25   BUN 47* 43* 31*  CREATININE 1.65* 1.37* 1.09  CALCIUM 8.4 8.7 8.4  GLUCOSE 109* 112* 106*     Assessment and Plan: Ann [name] is a 79 y.o. year old female presenting with shortness of breath, found to have subacute DVT of left lower extremity. Also with elevated troponin, BNP, and creatinine.   # DVT/likely PE: DVT diagnosed via LLE doppler, has likely been present for around one month given hx of leg swelling x 1 month and "subacute DVT" on preliminary doppler result. Given tachycardia, hypoxia, and tachypnea PE is also very likely (Wells score 7.5). Will not pursue additional imaging to diagnose PE at this time (CTA,  V/Q scan) as it will not change our medical management. Patient will need at least 3 months anticoagulation. INR today is subtherapeutic at 1.38 (goal 2-3). - Transferred to floor on tele 6/17  - O2 prn to keep sats >92%  - Continue lovenox today (daily dosing)  - coumadin per pharmacy (no xarelto due to carbamazepine use)  - Likely discharge today or tomorrow on once daily lovenox and discharge with [facility] RN INR checks  - PT recommends return home if daughter can provide 24 hour care, otherwise SNF [ ]  F/u OT recs  # Hypokalemia: s/p repletion, normal on 6/17 at 4.2  # Elevated troponin: Likely a result of presumed PE. MI less likely given no chest pain or EKG changes. Troponins have been negative x3. Repeat EKG 6/16 AM showed no acute changes.  - aspirin 81mg  daily (hold aggrenox given full anticoagulation)  - continue home beta blocker (has been out of this since last week, per daughter)  [ ]  needs beta blocker rx at d/c   # Elevated BNP: Likely a result of presumed PE. CXR is clear and lung sounds also clear, does not appear clinically fluid overloaded to suggest CHF. Last known echo was in 2006 and had EF 55-65%  - Continue to monitor lung exam and respiratory status   # UTI: RESOLVED. UA/micro suggestive of UTI (leukocytes, bacteria). Treat with keflex 500mg  PO TID x 7 days.  - urine cx shows no growth  - will d/c keflex as UA marginal and cx  with no growth   # AKI: Cr 2.3 on admission, baseline appears to be around 1.1-1.3 (has not had labs since February 2013)  - Likely prerenal, creatinine improved to 1.09 on 6/17  # HTN:  - continue home metoprolol, amlodipine  [ ]  restart HCTZ if BP rises, continue holding for now as pt is normotensive (had held due to AKI)   # Seizure disorder:  - continue home carbamazepine   # HLD:  - continue home lipitor   # Dementia:  - continue home namenda   # Alcohol use:  - Per daughter, pt drinks at most one beer per day, and doesn't have  one every day.  - unlikely to withdraw from this amount of alcohol but will monitor clinically for signs of withdrawal with CIWA protocol   FEN/GI: Heart healthy diet, IVF NS at 75cc/hr  PPx: Lovenox, bridging to warfarin Dispo: possible d/c today if can get [facility] set up and family amenable to d/c  Code Status: FULL; as discussed with patient's daughter on admission   Ann [name], Med Student 02/21/2013, 8:38 AM PGY-1, [facility] Family Medicine FPTS Intern pager: [phone]  PGY-2 Addendum  S: Reports doing well this morning, without chest pain or difficulty breathing.  O:  BP 145/91  Pulse 74  Temp(Src) 98.1 F (36.7 C) (Oral)  Resp 24  Ht 5\' 2"  (1.575 m)  Wt 154 lb 6.4 oz (70.035 kg)  BMI 28.23 kg/m2  SpO2 100% Gen: alert, cooperative, NAD, speech difficult to understand but at baseline HEENT: AT/, no dentures in, sclera white, MMM CV: RRR, no murmurs Pulm: CTAB, no wheezes or rales Ext: no edema or calf tenderness  A/P: Please see above note for complete A/P, briefly... 79 yo F with demenita, s/o stroke, HTN presenting with new DVT and presumed PE.  # DVT/PE: Switched to treatment dose lovenox while coumadin is adjusted.  Vitals are stable. - continue to have pharmacy dose coumadin - will work with CM to arrange home health for Lovenox and INR checks  # Elevated troponin/BNP: Likely secondary to cardiovascular strain from presumed PE.  Resolved.  # HTN: Stable on metoprolol and amlodipine. - can restart HCTZ if needed, but goal is <150/90  # Dementia: Appears stable.  - frequent re-orientation as needed - continue home namenda  # Dispo: pending PT/OT evaluation and arrangement of home health.  She does have 24 hour supervision at home.  Ann [name] 02/21/2013, 9:13 AM

## 2013-02-21 NOTE — Progress Notes (Signed)
ANTICOAGULATION CONSULT NOTE - Follow-Up Consult  Pharmacy Consult for Lovenox and Coumadin Indication: PE/DVT  No Known Allergies  Patient Measurements: Height: 5\' 2"  (157.5 cm) Weight: 154 lb 6.4 oz (70.035 kg) IBW/kg (Calculated) : 50.1 Heparin Dosing Weight: 67kg  Vital Signs: Temp: 98.1 F (36.7 C) (06/18 0540) Temp src: Oral (06/18 0540) BP: 145/91 mmHg (06/18 0540) Pulse Rate: 74 (06/18 0540)  Labs:  Recent Labs  02/18/13 2315 02/18/13 2318  02/19/13 0450 02/19/13 0955 02/19/13 1451 02/20/13 0504 02/20/13 1120 02/21/13 0535  HGB  --   --   --  11.6*  --   --  11.0*  --  10.7*  HCT  --   --   --  34.3*  --   --  33.0*  --  33.0*  PLT  --   --   --  160  --   --  177  --  203  LABPROT  --   --   --  15.7*  --   --   --  16.2* 16.6*  INR  --   --   --  1.28  --   --   --  1.33 1.38  HEPARINUNFRC  --   --   < > 0.77*  --  0.64 0.52  --  0.43  CREATININE  --  1.65*  --  1.37*  --   --  1.09  --   --   TROPONINI <0.30  --   --  <0.30 <0.30  --   --   --   --   < > = values in this interval not displayed.  Estimated Creatinine Clearance: 35.9 ml/min (by C-G formula based on Cr of 1.09).   Assessment: 79 year old female on lovenox and coumadin for DVT noted in popliteal and femoral veins of lower extremity, and possible PE.  This is overlap day 3/5-heparin changed to lovenox yesterday.  No bleeding noted.  INR is subtherapeutic.  Scr=1.09 and Crcl~47ml/min.    Goal of Therapy:  Xa=0.6-1.2 Monitor platelets by anticoagulation protocol: Yes INR=2-3   Plan: Cont Lovenox 100mg  SQ daily (1.5mg /kg/dose).  Repeat Coumadin 5mg  po x 1 dose tonight. Daily PT/INR.   Wendie Simmer, PharmD, BCPS Clinical Pharmacist  Pager: [phone]

## 2013-02-21 NOTE — Care Management Note (Signed)
    Page 1 of 2   02/21/2013     10:35:08 AM   CARE MANAGEMENT NOTE 02/21/2013  Patient:  Ann [name], Ann [name]   Account Number:  [id number]  Date Initiated:  02/19/2013  Documentation initiated by:  Donn Pierini  Subjective/Objective Assessment:   Pt admitted with sob- +DVT and presumed PE     Action/Plan:   PTA pt lived at home with family, NCM to follow for d/c planning and needs.   Anticipated DC Date:  02/23/2013   Anticipated DC Plan:  HOME W HOME HEALTH SERVICES      DC Planning Services  CM consult      [facility] Choice  HOME HEALTH   Choice offered to / List presented to:  C-4 Adult Children        HH arranged  HH-1 RN  HH-10 DISEASE MANAGEMENT  HH-2 PT      HH agency  [facility]   Status of service:  Completed, signed off Medicare Important Message given?   (If response is "NO", the following Medicare IM given date fields will be blank) Date Medicare IM given:   Date Additional Medicare IM given:    Discharge Disposition:  HOME W HOME HEALTH SERVICES  Per UR Regulation:  Reviewed for med. necessity/level of care/duration of stay  If discussed at Long Length of Stay Meetings, dates discussed:    Comments:  02-21-13 [address] Tomi Bamberger, RN,BSN [phone] CM was able to speak ot daughter in reference to [facility] services. Per daughter pt has RW at home. Daughter chose [facility] for services. CM did make referral for services and SOC to begin within 24-48 hors post d/c. CM did place a call to the MD and they have to round on pt to see if the pt will be able to go home with possible lovenox. Pt will probably need HH RN to do PT/INR checks. MD to get back with CM with an update. Will continue to monitor.

## 2013-02-21 NOTE — Discharge Summary (Signed)
Family Medicine Teaching [facility] Discharge Summary  Patient name: Ann [name] Medical record number: [id number] Date of birth: 03/16/1927 Age: 79 y.o. Gender: female Date of Admission: 02/18/2013  Date of Discharge: 02/22/2013 Admitting Physician: Janit Pagan, MD  Primary Care Provider: Ardyth Gal, MD  Indication for Hospitalization: DVT with probable PE Discharge Diagnoses:  1. DVT with likely PE 2. Hypokalemia - resolved 3. Elevated troponin - resolved 4. Elevated BNP without clinical signs of congestive heart failure 5. UTI - resolved 6. AKI - resolved  Consultations: None  Significant Labs and Imaging:   Recent Labs Lab 02/20/13 0504 02/21/13 0535 02/22/13 0539  WBC 4.9 4.5 5.2  HGB 11.0* 10.7* 10.5*  HCT 33.0* 33.0* 31.6*  PLT 177 203 219    Recent Labs Lab 02/18/13 1610 02/18/13 2318 02/19/13 0450 02/20/13 0504  NA 143 142 142 142  K 3.3* 3.2* 3.2* 4.2  CL 105 105 107 108  CO2  --  27 25 25   GLUCOSE 138* 109* 112* 106*  BUN 51* 47* 43* 31*  CREATININE 2.30* 1.65* 1.37* 1.09  CALCIUM  --  8.4 8.7 8.4    Procedures: 02/18/13 - Lower Extremity Venous Duplex Left: Findings consistent with subacute DVT 02/18/13 - EKG: Sinus tachycardia; interior infarct, age undetermined; anterior infarct, age undetermined 02/19/13 - EKG: Normal sinus rhythm; low voltage QRS; interior infarct, age undetermined; no significant change from EKG on 6/15  Brief Hospital Course: Ann [name] is a 79 y.o. year old female presenting with shortness of breath, found to have subacute DVT of left lower extremity. She also presented with elevated troponin, BNP, and creatinine. Please see H&P for more detail. Patient was stable throughout her admission and was placed on anticoagulant therapy. The remainder of her hospital course is as below by problem:  # DVT/likely PE: DVT diagnosed via LLE doppler, has likely been present for around one month given hx of leg swelling x 1  month and "subacute DVT" on preliminary doppler result. Given tachycardia, hypoxia, and tachypnea PE is also very likely (Wells score 7.5), so did not pursue further imaging.  Patient will need at least 3 months anticoagulation.  She was started on coumadin and bridged with lovenox. INR on discharge is therapeutic at 2.01 (goal 2-3). PT/OT recommended 24hr supervision with home health PT/OT services. - Patient will require one more dose of lovenox tomorrow for bridging completion; HH RN ordered for this - Coumadin 2.5mg  daily at 5pm, per pharmacy recommendations - [facility] PT/OT   # Hypokalemia: s/p repletion, normal on 6/17 at 4.2   # Elevated troponin: Likely a result of presumed PE. MI less likely given no chest pain or EKG changes. Repeat troponins were negative x3. Repeat EKG 6/16 AM showed no acute changes.   # Elevated BNP: Likely a result of presumed PE. CXR on admission was clear and lung sounds were also clear. Patient did not appear clinically fluid overloaded to suggest CHF. Last known echo was in 2006 and had EF 55-65%. Patient's respiratory status was monitored throughout her admission. Some wheezing was noted on exam the morning of discharge and the patient received albuterol x1. Repeat exam prior to discharge showed great improvement.   - Albuterol rx sent to home pharmacy, use as needed for wheezing  # UTI: UA/micro suggestive of UTI (leukocytes, bacteria). Originally planned to treat with keflex 500mg  PO TID x 7 days, but keflex discontinued on 6/17 as UA marginal and cx with no growth.  # AKI: Cr 2.3  on admission, this resolved with IVFs.   # HTN: Continued home metoprolol, amlodipine. Held HCTZ initially 2/2 AKI and then because patient was able to maintain normotensive pressures without.  Will defer restarting HCTZ to PCP.   # Seizure disorder: Stable on home carbamazepine.  # HLD: Continued on home lipitor.  # Dementia: Stable on home namenda.  # History of stroke: Home  aggrenox was held as she was put on anticoagulation.  At discharge her aggrenox was stopped and she was started on a baby aspirin daily to balance stroke prevention with bleeding risk.   # Alcohol use: Per daughter, pt drinks at most one beer per day, and doesn't have one every day. She was monitored for withdrawal with the CIWA protocol and did not require any ativan.   Discharge Medications:    Medication List    STOP taking these medications       dipyridamole-aspirin 200-25 MG per 12 hr capsule  Commonly known as:  AGGRENOX     hydrochlorothiazide 25 MG tablet  Commonly known as:  HYDRODIURIL      TAKE these medications       amLODipine 10 MG tablet  Commonly known as:  NORVASC  Take 1 tablet (10 mg total) by mouth daily.     aspirin 81 MG chewable tablet  Chew 1 tablet (81 mg total) by mouth daily.     atorvastatin 20 MG tablet  Commonly known as:  LIPITOR  Take 20 mg by mouth daily.     carbamazepine 300 MG 12 hr capsule  Commonly known as:  CARBATROL  Take 300 mg by mouth 2 (two) times daily.     enoxaparin 100 MG/ML injection  Commonly known as:  LOVENOX  Inject 1 mL (100 mg total) into the skin daily. On 02/23/13 only     haloperidol 0.5 MG tablet  Commonly known as:  HALDOL  Take 1 tablet (0.5 mg total) by mouth 3 (three) times daily as needed.     memantine 5 MG tablet  Commonly known as:  NAMENDA  Take 5 mg by mouth 2 (two) times daily.     metoprolol succinate 50 MG 24 hr tablet  Commonly known as:  TOPROL-XL  Take 2 tablets (100 mg total) by mouth daily. Take with or immediately following a meal.     warfarin 2.5 MG tablet  Commonly known as:  COUMADIN  Take 1 tablet (2.5 mg total) by mouth daily.        Disposition: Home with home health OT/PT  Issues for Follow Up:  [ ]  Follow up INR in clinic, will likely need weekly checks for now  [ ]  Restart HCTZ if BP rises  Outstanding Results: None  Discharge Instructions: Please refer to Patient  Instructions section of EMR for full details.  Patient was counseled important signs and symptoms that should prompt return to medical care, changes in medications, dietary instructions, activity restrictions, and follow up appointments.   Follow-up Information   Follow up with [facility], MD On 02/28/2013. (at 3:30pm)    Contact information:   [address] [place] [address] [phone]     Discharge Condition: Clinically stable with therapeutic INR  BOOTH, ERIN, MD 02/22/2013, 3:45 PM

## 2013-02-21 NOTE — Care Management (Signed)
CM did a benefits check for lovenox and cost for lovenox for 7 days total for 14 syringes will be 405.66. Thanks Gala Lewandowsky, RN,BSN Care Manager [phone]

## 2013-02-21 NOTE — Evaluation (Signed)
Occupational Therapy Evaluation Patient Details Name: Ann [name] MRN: [id number] DOB: 03/16/1927 Today's Date: 02/21/2013 Time: 1212-1225 OT Time Calculation (min): 13 min  OT Assessment / Plan / Recommendation Clinical Impression  Pt admitted and found to have subacute DVT with probable PE. No further eval of PE due to no change in medical management if it were confirmed. Pt with hx of dementia and unreliable historian regarding home living situation and PLOF. Will recommend HHOT with 24/7 sup/assist. If pt does not have 24/7 assist, then recommend SNF. Will continue to follow acutely in order to address below problem list.    OT Assessment  Patient needs continued OT Services    Follow Up Recommendations  Home health OT;Supervision/Assistance - 24 hour;SNF    Barriers to Discharge   unsure if patient has 24/7 supervision/assist  Equipment Recommendations   (TBD)    Recommendations for Other Services    Frequency  Min 2X/week    Precautions / Restrictions Precautions Precautions: Fall   Pertinent Vitals/Pain See vitals    ADL  Grooming: Performed;Wash/dry face;Brushing hair;Minimal assistance Where Assessed - Grooming: Supported standing Lower Body Dressing: Performed;Minimal assistance Where Assessed - Lower Body Dressing: Unsupported sitting Toilet Transfer: Simulated;Minimal assistance Toilet Transfer Method:  (ambulating) Acupuncturist:  (bed) Equipment Used: Gait belt Transfers/Ambulation Related to ADLs: Min assist with cane ambulating to sink and then back to bed.    OT Diagnosis: Generalized weakness;Cognitive deficits  OT Problem List: Decreased strength;Decreased activity tolerance;Decreased cognition;Impaired balance (sitting and/or standing) OT Treatment Interventions: Self-care/ADL training;DME and/or AE instruction;Therapeutic activities;Cognitive remediation/compensation;Balance training;Patient/family education   OT Goals Acute Rehab OT  Goals OT Goal Formulation: With patient Time For Goal Achievement: 02/28/13 Potential to Achieve Goals: Good ADL Goals Pt Will Perform Grooming: with supervision;Standing at sink ADL Goal: Grooming - Progress: Goal set today Pt Will Perform Lower Body Dressing: with supervision;Sit to stand from chair;Sit to stand from bed ADL Goal: Lower Body Dressing - Progress: Goal set today Pt Will Transfer to Toilet: with supervision;Ambulation;with DME;Comfort height toilet ADL Goal: Toilet Transfer - Progress: Goal set today Pt Will Perform Toileting - Clothing Manipulation: with supervision;Standing ADL Goal: Toileting - Clothing Manipulation - Progress: Goal set today Pt Will Perform Toileting - Hygiene: with supervision;Sit to stand from 3-in-1/toilet ADL Goal: Toileting - Hygiene - Progress: Goal set today  Visit Information  Last OT Received On: 02/21/13 Assistance Needed: +1    Subjective Data      Prior Functioning     Home Living Lives With: Daughter Home Adaptive Equipment: Straight cane Additional Comments: Pt poor historian. Per PT note and chart review, pt lives with daughter. Prior Function Comments: Pt poor historian. Unsure of PLOF.         Vision/Perception     Cognition  Cognition Overall Cognitive Status: History of cognitive impairments - at baseline (dementia)    Extremity/Trunk Assessment Right Upper Extremity Assessment RUE ROM/Strength/Tone: WFL for tasks assessed Left Upper Extremity Assessment LUE ROM/Strength/Tone: WFL for tasks assessed     Mobility Bed Mobility Bed Mobility: Supine to Sit;Sitting - Scoot to Edge of Bed;Sit to Supine Supine to Sit: 4: Min assist Sitting - Scoot to Edge of Bed: 4: Min guard Sit to Supine: 4: Min assist Transfers Transfers: Sit to Stand;Stand to Sit Sit to Stand: 4: Min assist;From bed Stand to Sit: 4: Min assist;To bed     Exercise     Balance     End of Session OT - End of Session  Equipment Utilized  During Treatment: Gait belt Activity Tolerance: Patient tolerated treatment well Patient left: in bed;with call bell/phone within reach  GO    02/21/2013 Cipriano Mile OTR/L Pager [phone] Office [phone]  Cipriano Mile 02/21/2013, 5:04 PM

## 2013-02-21 NOTE — Progress Notes (Signed)
FMTS Attending Daily Note: Stephen Baruch MD [phone] pager office [phone] I  have seen and examined this patient, reviewed their chart. I have discussed this patient with the resident. I agree with the resident's findings, assessment and care plan. 

## 2013-02-21 NOTE — Progress Notes (Signed)
FMTS Attending Daily Note: Kenith Trickel MD [phone] pager office [phone] I  have seen and examined this patient, reviewed their chart. I have discussed this patient with the resident. I agree with the resident's findings, assessment and care plan. 

## 2013-02-22 ENCOUNTER — Encounter (HOSPITAL_COMMUNITY): Payer: Self-pay | Admitting: Student

## 2013-02-22 DIAGNOSIS — I82409 Acute embolism and thrombosis of unspecified deep veins of unspecified lower extremity: Secondary | ICD-10-CM

## 2013-02-22 HISTORY — DX: Acute embolism and thrombosis of unspecified deep veins of unspecified lower extremity: I82.409

## 2013-02-22 LAB — CBC
HCT: 31.6 % — ABNORMAL LOW (ref 36.0–46.0)
Hemoglobin: 10.5 g/dL — ABNORMAL LOW (ref 12.0–15.0)
RBC: 3.42 MIL/uL — ABNORMAL LOW (ref 3.87–5.11)

## 2013-02-22 LAB — PROTIME-INR
INR: 2.01 — ABNORMAL HIGH (ref 0.00–1.49)
Prothrombin Time: 22 seconds — ABNORMAL HIGH (ref 11.6–15.2)

## 2013-02-22 MED ORDER — ASPIRIN 81 MG PO CHEW: 81.0000 mg | CHEWABLE_TABLET | Freq: Every day | ORAL | Status: DC

## 2013-02-22 MED ORDER — ALBUTEROL SULFATE (5 MG/ML) 0.5% IN NEBU
2.5000 mg | INHALATION_SOLUTION | RESPIRATORY_TRACT | Status: DC | PRN
  Administered 2013-02-22: 2.5 mg via RESPIRATORY_TRACT
  Filled 2013-02-22: qty 0.5

## 2013-02-22 MED ORDER — ENOXAPARIN SODIUM 100 MG/ML ~~LOC~~ SOLN: 1.5000 mg/kg | Freq: Every day | SUBCUTANEOUS | Status: DC

## 2013-02-22 MED ORDER — METOPROLOL SUCCINATE ER 50 MG PO TB24: 100.0000 mg | ORAL_TABLET | Freq: Every day | ORAL | Status: DC

## 2013-02-22 MED ORDER — WARFARIN SODIUM 2.5 MG PO TABS: 2.5000 mg | ORAL_TABLET | Freq: Every day | ORAL | Status: DC

## 2013-02-22 MED ORDER — WARFARIN SODIUM 2 MG PO TABS
2.0000 mg | ORAL_TABLET | Freq: Once | ORAL | Status: AC
  Administered 2013-02-22: 2 mg via ORAL
  Filled 2013-02-22: qty 1

## 2013-02-22 NOTE — Care Management (Signed)
CM called [facility] and the cost of one syringe for lovenox will be 29.63. MD is aware. CM did call [facility] and cancelled [facility] RN. The only services needed will be HH PT/OT. NO further needs from CM at this time. Gala Lewandowsky, RN, BSN [phone]

## 2013-02-22 NOTE — Progress Notes (Signed)
Physical Therapy Treatment Patient Details Name: Ann [name] MRN: [id number] DOB: 03/16/1927 Today's Date: 02/22/2013 Time: 4098-1191 PT Time Calculation (min): 25 min  PT Assessment / Plan / Recommendation Comments on Treatment Session  Pt demonstrates some physical decline since prior session. requiring more assist for mobility today.  Additionally, pt with significant wheezing during minimal activity and at rest. At this point, feel that patient must have 24/7 hands on assist to be considered safe for home with home health. If family is unable to provided 24/7 hands-on assist, feel patient will need SNF to ensure safety. Will continue to see as indicated.    Follow Up Recommendations  Home health PT;SNF;Supervision/Assistance - 24 hour     Does the patient have the potential to tolerate intense rehabilitation     Barriers to Discharge        Equipment Recommendations  Rolling walker with 5" wheels    Recommendations for Other Services    Frequency Min 3X/week   Plan Discharge plan remains appropriate    Precautions / Restrictions Precautions Precautions: Fall Restrictions Weight Bearing Restrictions: No   Pertinent Vitals/Pain NAD. O2 sats 98%    Mobility  Bed Mobility Bed Mobility: Supine to Sit;Sitting - Scoot to Edge of Bed;Sit to Supine Supine to Sit: 4: Min assist Sitting - Scoot to Edge of Bed: 4: Min assist Sit to Supine: 4: Min assist Transfers Transfers: Sit to Stand;Stand to [employer] Transfers Sit to Stand: 3: Mod assist;From bed;From chair/3-in-1 Stand to Sit: 3: Mod assist;To bed;To chair/3-in-1 Stand Pivot Transfers: 3: Mod assist;With armrests Details for Transfer Assistance: VCs for hand placement and positioning, Cues for safety, assist needed secondary to patient weakness and inability to reach safe upright posture Ambulation/Gait Ambulation/Gait Assistance: Not tested (comment) (patient unsafe/unable to ambulate today)    Exercises General  Exercises - Lower Extremity Ankle Circles/Pumps: AROM;Both;5 reps (seated EOB) Long Arc Quad: AROM;Both;5 reps (seated EOB)     PT Goals Acute Rehab PT Goals PT Goal Formulation: Patient unable to participate in goal setting Time For Goal Achievement: 02/27/13 Potential to Achieve Goals: Good Pt will go Supine/Side to Sit: with modified independence PT Goal: Supine/Side to Sit - Progress: Not progressing Pt will go Sit to Stand: with modified independence PT Goal: Sit to Stand - Progress: Not progressing Pt will Transfer Bed to Chair/Chair to Bed: with modified independence PT Transfer Goal: Bed to Chair/Chair to Bed - Progress: Not progressing Pt will Ambulate: 51 - 150 feet;with least restrictive assistive device;with supervision PT Goal: Ambulate - Progress: Not progressing  Visit Information  Last PT Received On: 02/22/13 Assistance Needed: +1    Subjective Data  Subjective: I need to use the pot   Cognition  Cognition Overall Cognitive Status: History of cognitive impairments - at baseline (dementia)    Balance  Balance Balance Assessed: Yes Static Sitting Balance Static Sitting - Balance Support: Feet supported Static Sitting - Level of Assistance: 5: Stand by assistance Static Sitting - Comment/# of Minutes: seated EOB performing ther ex, and seated on commode during hygiene and dressing  End of Session PT - End of Session Equipment Utilized During Treatment: Gait belt Activity Tolerance: Patient tolerated treatment well Patient left: in bed;with bed alarm set Nurse Communication: Mobility status   GP     Fabio Asa 02/22/2013, 1:52 PM Charlotte Crumb, PT DPT  [phone]

## 2013-02-22 NOTE — Progress Notes (Signed)
Occupational Therapy Treatment Patient Details Name: Ann [name] MRN: [id number] DOB: 03/16/1927 Today's Date: 02/22/2013 Time: 4098-1191 OT Time Calculation (min): 24 min  OT Assessment / Plan / Recommendation Comments on Treatment Session Pt requiring slightly increased level of assist for toilet transfer today.  Continue to recommend home only if pt has 24/7 assistance.  If not, pt will need to d/c to SNF.    Follow Up Recommendations  Home health OT;Supervision/Assistance - 24 hour;SNF    Barriers to Discharge       Equipment Recommendations  3 in 1 bedside comode    Recommendations for Other Services    Frequency Min 2X/week   Plan Discharge plan remains appropriate    Precautions / Restrictions Precautions Precautions: Fall Restrictions Weight Bearing Restrictions: No   Pertinent Vitals/Pain See vitals    ADL  Grooming: Performed;Wash/dry face;Set up Where Assessed - Grooming: Unsupported sitting Upper Body Dressing: Performed;Minimal assistance Where Assessed - Upper Body Dressing: Unsupported sitting Lower Body Dressing: Performed;Supervision/safety Where Assessed - Lower Body Dressing: Unsupported sitting Toilet Transfer: Performed;Moderate assistance Toilet Transfer Method: Stand pivot Toilet Transfer Equipment: Bedside commode Equipment Used: Rolling walker;Gait belt Transfers/Ambulation Related to ADLs: Mod assist for SPT from bed<>3n1.  Pt with flexed trunk over RW and needing assist for balance and sequencing of transfer. ADL Comments: Pt donned socks long sitting in bed. Pt incontinent of urine in bed upon OT/PT arrival. Assist pt with toilet transfer and to don clean gown.  Pt reports she typically wears depends briefs at home./    OT Diagnosis:    OT Problem List:   OT Treatment Interventions:     OT Goals ADL Goals Pt Will Perform Grooming: with supervision;Standing at sink ADL Goal: Grooming - Progress: Progressing toward goals Pt Will Perform  Lower Body Dressing: with supervision;Sit to stand from chair;Sit to stand from bed ADL Goal: Lower Body Dressing - Progress: Progressing toward goals Pt Will Transfer to Toilet: with supervision;Ambulation;with DME;Comfort height toilet ADL Goal: Toilet Transfer - Progress: Progressing toward goals  Visit Information  Last OT Received On: 02/22/13 Assistance Needed: +1    Subjective Data      Prior Functioning       Cognition  Cognition Arousal/Alertness: Awake/alert Behavior During Therapy: WFL for tasks assessed/performed Overall Cognitive Status: History of cognitive impairments - at baseline    Mobility  Bed Mobility Bed Mobility: Supine to Sit;Sitting - Scoot to Edge of Bed;Sit to Supine Supine to Sit: 4: Min assist Sitting - Scoot to Edge of Bed: 4: Min assist Sit to Supine: 4: Min assist Transfers Transfers: Sit to Stand;Stand to Sit Sit to Stand: 3: Mod assist;From bed;From chair/3-in-1;With upper extremity assist Stand to Sit: 3: Mod assist;To bed;To chair/3-in-1;With armrests;With upper extremity assist Details for Transfer Assistance: VCs for hand placement and positioning, Cues for safety, assist needed secondary to patient weakness and inability to reach safe upright posture    Exercises     Balance   End of Session OT - End of Session Equipment Utilized During Treatment: Gait belt Activity Tolerance: Patient tolerated treatment well Patient left: in bed;with call bell/phone within reach;with bed alarm set Nurse Communication: Mobility status  GO   02/22/2013 Cipriano Mile OTR/L Pager [phone] Office [phone]   Cipriano Mile 02/22/2013, 2:47 PM

## 2013-02-22 NOTE — Progress Notes (Signed)
ANTICOAGULATION CONSULT NOTE - Follow-Up Consult  Pharmacy Consult for Lovenox and Coumadin Indication: PE/DVT  No Known Allergies  Patient Measurements: Height: 5\' 2"  (157.5 cm) Weight: 155 lb (70.308 kg) IBW/kg (Calculated) : 50.1 Heparin Dosing Weight: 67kg  Vital Signs: Temp: 99.2 F (37.3 C) (06/19 0509) Temp src: Oral (06/19 0509) BP: 125/85 mmHg (06/19 1021) Pulse Rate: 73 (06/19 1023)  Labs:  Recent Labs  02/19/13 1451  02/20/13 0504 02/20/13 1120 02/21/13 0535 02/22/13 0539  HGB  --   < > 11.0*  --  10.7* 10.5*  HCT  --   --  33.0*  --  33.0* 31.6*  PLT  --   --  177  --  203 219  LABPROT  --   --   --  16.2* 16.6* 22.0*  INR  --   --   --  1.33 1.38 2.01*  HEPARINUNFRC 0.64  --  0.52  --  0.43  --   CREATININE  --   --  1.09  --   --   --   < > = values in this interval not displayed.  Estimated Creatinine Clearance: 35.9 ml/min (by C-G formula based on Cr of 1.09).   Assessment: 79 year old female on lovenox and coumadin for DVT noted in popliteal and femoral veins of lower extremity, and possible PE.  This is overlap day #4/5- INR is therapeutic, today, large increase, noted drug interaction with tegretol.  Will decrease coumadin dose today. Will need at least one more day of lovenox.   Goal of Therapy:  Monitor platelets by anticoagulation protocol: Yes INR=2-3   Plan: Cont Lovenox 100mg  SQ daily (1.5mg /kg/dose).  Coumadin 2mg  today (estimate daily dose upon discharge to be 2.5mg  daily.) F/u daily protime.  Verlene Mayer, PharmD, BCPS Pager [phone]

## 2013-02-22 NOTE — Progress Notes (Signed)
Family Medicine Teaching Service Daily Progress Note Intern Pager: [phone]  Patient name: Ann [name] Medical record number: [id number] Date of birth: 03/16/1927 Age: 79 y.o. Gender: female  Primary Care Provider: Ardyth Gal, MD  Subjective: Patient says she is doing well today. She endorses some pain of her left pinky toe, but has no other complaints. Subjective assessment limited 2/2 dementia.  Objective: Temp:  [98.1 F (36.7 C)-99.2 F (37.3 C)] 99.2 F (37.3 C) (06/19 0509) Pulse Rate:  [75-88] 79 (06/19 0509) Resp:  [18-22] 18 (06/19 0509) BP: (121-149)/(83-100) 144/96 mmHg (06/19 0509) SpO2:  [95 %-99 %] 99 % (06/19 0509) Weight:  [155 lb (70.308 kg)] 155 lb (70.308 kg) (06/19 0509) Exam: General: Eating breakfast happily, no acute distress Cardiovascular: RRR, no murmur heard Respiratory: Mild increased work of breathing, unable to auscultate 2/2 patient unwillingness to lean forward Abdomen: Soft, active bowl sounds, no TTP Extremities: No edema, large callous at site of amputated L fifth toe  Laboratory:  Recent Labs Lab 02/20/13 0504 02/21/13 0535 02/22/13 0539  WBC 4.9 4.5 5.2  HGB 11.0* 10.7* 10.5*  HCT 33.0* 33.0* 31.6*  PLT 177 203 219    Recent Labs Lab 02/18/13 2318 02/19/13 0450 02/20/13 0504  NA 142 142 142  K 3.2* 3.2* 4.2  CL 105 107 108  CO2 27 25 25   BUN 47* 43* 31*  CREATININE 1.65* 1.37* 1.09  CALCIUM 8.4 8.7 8.4  GLUCOSE 109* 112* 106*   INR (6/19): 2.01 PT (6/19): 22.0  Imaging/Diagnostic Tests: None  Assessment and Plan: KRISTEEN LANTZ is a 79 y.o. year old female presenting with shortness of breath, found to have subacute DVT of left lower extremity. Also with elevated troponin, BNP, and creatinine.   # DVT/likely PE: DVT diagnosed via LLE doppler, has likely been present for around one month given hx of leg swelling x 1 month and "subacute DVT" on preliminary doppler result. Given tachycardia, hypoxia, and tachypnea  PE is also very likely (Wells score 7.5). Will not pursue additional imaging to diagnose PE at this time (CTA, V/Q scan) as it will not change our medical management. Patient will need at least 3 months anticoagulation. INR today is therapeutic at 2.01 (goal 2-3).  - Transferred to floor on tele 6/17  - O2 prn to keep sats >92%  - Continue lovenox today (daily dosing) and tomorrow with INR recheck tomorrow - Coumadin per pharmacy (no xarelto due to carbamazepine use)  - Likely discharge today on once daily lovenox and discharge with [facility] RN INR checks  - PT and OT both recommend return home if daughter can provide 24 hour care, otherwise SNF  [ ]  F/u Case Management for Home Health arrangements  # Hypokalemia: s/p repletion, normal on 6/17 at 4.2   # Elevated troponin: Likely a result of presumed PE. MI less likely given no chest pain or EKG changes. Troponins have been negative x3. Repeat EKG 6/16 AM showed no acute changes.  - aspirin 81mg  daily (hold aggrenox given full anticoagulation)  - continue home beta blocker (has been out of this since last week, per daughter)  [ ]  needs beta blocker rx at d/c   # Elevated BNP: Likely a result of presumed PE. CXR is clear and lung sounds also clear, does not appear clinically fluid overloaded to suggest CHF. Last known echo was in 2006 and had EF 55-65%  - Continue to monitor lung exam and respiratory status   # UTI: RESOLVED. UA/micro suggestive  of UTI (leukocytes, bacteria). Treat with keflex 500mg  PO TID x 7 days.  - urine cx shows no growth  - will d/c keflex as UA marginal and cx with no growth   # AKI: Cr 2.3 on admission, baseline appears to be around 1.1-1.3 (has not had labs since February 2013)  - Likely prerenal, creatinine improved to 1.09 on 6/17   # HTN:  - continue home metoprolol, amlodipine  [ ]  restart HCTZ if BP rises, continue holding for now as pt is normotensive (had held due to AKI)   # Seizure disorder:  - continue home  carbamazepine   # HLD:  - continue home lipitor   # Dementia:  - continue home namenda   # Alcohol use:  - Per daughter, pt drinks at most one beer per day, and doesn't have one every day.  - unlikely to withdraw from this amount of alcohol but will monitor clinically for signs of withdrawal with CIWA protocol   FEN/GI: Heart healthy diet, IVF NS at 75cc/hr  PPx: Lovenox, bridging to warfarin; patient will require Lovenox today and tomorrow Dispo: possible d/c today if can get [facility] set up and family amenable to d/c  Code Status: FULL; as discussed with patient's daughter on admission   Kittie Plater, Med Student 02/22/2013, 9:14 AM PGY-1, [facility] Family Medicine FPTS Intern pager: [phone]   PGY-2 Addendum S: Patient is doing well this morning.  No complaints.  Denies pain or shortness of breath.  Per OT/PT who are in the room, she is requiring more assist today.  O:  BP 144/96  Pulse 79  Temp(Src) 99.2 F (37.3 C) (Oral)  Resp 18  Ht 5\' 2"  (1.575 m)  Wt 155 lb (70.308 kg)  BMI 28.34 kg/m2  SpO2 96% Gen: alert, cooperative, NAD, on commode HEENT: AT/[place], sclera white, MMM CV: RRR, no murmurs Pulm: slightly increased work of breathing; poor air movement in bilateral bases, diffuse expiratory wheezes worse in bases  See above for labs.  A/P: Please see above note for complete A/P, briefly... 79 yo F with dementia, h/o stroke, HTN presenting with DVT and presumed PE.  # DVT/PE: Unclear etiology.  May be secondary to immobility.  INR is therapeutic today at 2.01. - continue coumadin per pharmacy - will need lovenox today and tomorrow - O2 prn to keep sats >90%  # Wheezing: May be related to PE. - will give albuterol neb - recheck prior to d/c  # Hypertension: Stable on metoprolol and amlodipine - would hold HCTZ on discharge; PCP can restart if indicated  # Dementia: Stable on home namenda  # Dispo: Likely today with HHPT/OT/RN as long as wheezing improves  with albuterol.  Consider giving lovenox sample from clinic as she will just need one additional dose tomorrow given therapeutic INR today.    BOOTH, Zephyra Bernardi 02/22/2013, 9:52 AM

## 2013-02-22 NOTE — Care Management (Signed)
[facility] RN will go out to administer the lovenox injection for tomorrow. No further needs from CM at this time. Thanks Gala Lewandowsky, RN,BSN [phone]

## 2013-02-23 ENCOUNTER — Ambulatory Visit: Payer: Medicare Other | Admitting: Family Medicine

## 2013-02-23 NOTE — Progress Notes (Signed)
FMTS Attending Daily Note: Tarvis Blossom MD [phone] pager office [phone] I  have seen and examined this patient, reviewed their chart. I have discussed this patient with the resident. I agree with the resident's findings, assessment and care plan. 

## 2013-02-23 NOTE — Discharge Summary (Signed)
Family Medicine Teaching Service  Discharge Note : Attending Minnie Shi MD Pager [phone] Office [phone] I have seen and examined this patient, reviewed their chart and discussed discharge planning wit the resident at the time of discharge. I agree with the discharge plan as above.  

## 2013-02-28 ENCOUNTER — Ambulatory Visit (INDEPENDENT_AMBULATORY_CARE_PROVIDER_SITE_OTHER): Payer: Medicare Other | Admitting: Family Medicine

## 2013-02-28 ENCOUNTER — Ambulatory Visit (INDEPENDENT_AMBULATORY_CARE_PROVIDER_SITE_OTHER): Payer: Medicare Other | Admitting: *Deleted

## 2013-02-28 ENCOUNTER — Encounter: Payer: Self-pay | Admitting: Family Medicine

## 2013-02-28 VITALS — BP 154/101 | HR 88 | Temp 98.1°F | Ht 62.0 in | Wt 153.0 lb

## 2013-02-28 DIAGNOSIS — N259 Disorder resulting from impaired renal tubular function, unspecified: Secondary | ICD-10-CM

## 2013-02-28 DIAGNOSIS — Q828 Other specified congenital malformations of skin: Secondary | ICD-10-CM

## 2013-02-28 DIAGNOSIS — I10 Essential (primary) hypertension: Secondary | ICD-10-CM

## 2013-02-28 DIAGNOSIS — I82401 Acute embolism and thrombosis of unspecified deep veins of right lower extremity: Secondary | ICD-10-CM

## 2013-02-28 DIAGNOSIS — L851 Acquired keratosis [keratoderma] palmaris et plantaris: Secondary | ICD-10-CM

## 2013-02-28 DIAGNOSIS — I82409 Acute embolism and thrombosis of unspecified deep veins of unspecified lower extremity: Secondary | ICD-10-CM

## 2013-02-28 LAB — POCT INR: INR: 1.2

## 2013-02-28 MED ORDER — DICLOFENAC SODIUM 1 % TD GEL: 2.0000 g | Freq: Four times a day (QID) | TRANSDERMAL | Status: DC

## 2013-02-28 NOTE — Progress Notes (Signed)
  Subjective:    Patient ID: Ann [name], female    DOB: 03/16/1927, 79 y.o.   MRN: [id number]  HPI:  Desirey comes in with her daughter for hospital follow up.  She had a DVT (and probably a PE) and some acute on chronic renal failure in the hospital.   Anticoagulation: taking 2.5 mg of coumadin daily, denies missing doses.  Has home health coming out to house, can check INR.  No bleeding or bruising.   Leg pain: still with some swelling in her right lower leg where she has the DVT.  She also has pain at her toes where she has significant keratosis.   Renal insufficiency: Has had small bumps in creatinine in the past, but not needed kidney doctor before. Creatinine improved significantly prior to d/c.   Lab Results  Component Value Date   CREATININE 1.09 02/20/2013   Lab Results  Component Value Date   BUN 31* 02/20/2013     Past Medical History  Diagnosis Date  . Hypertension   . Dementia   . Hyperlipidemia   . Seizures   . Metatarsal bone fracture     Right - base of 5th  . DVT of lower extremity (deep venous thrombosis) 02/2013    Left lower extremity, subacute    History  Substance Use Topics  . Smoking status: Never Smoker   . Smokeless tobacco: Never Used  . Alcohol Use: Yes    No family history on file.   ROS Pertinent items in HPI    Objective:  Physical Exam:  BP 154/101  Pulse 88  Temp(Src) 98.1 F (36.7 C) (Oral)  Ht 5\' 2"  (1.575 m)  Wt 153 lb (69.4 kg)  BMI 27.98 kg/m2 General appearance: alert, cooperative and no distress Head: Normocephalic, without obvious abnormality, atraumatic Lungs: clear to auscultation bilaterally Heart: regular rate and rhythm, S1, S2 normal, no murmur, click, rub or gallop Pulses: 2+ and symmetric LE: 1+ edema of R lower leg, significant keratosis of toes, esp 5th toes, but no sign of infection.        Assessment & Plan:

## 2013-02-28 NOTE — Assessment & Plan Note (Signed)
Followed by podiatry, advised vaseline and Rx for voltaren gel for pain.

## 2013-02-28 NOTE — Assessment & Plan Note (Signed)
Will re-check INR in one week, increase coumadin to 2.5 alternating with 5 mg daily. Rx written for home health nurse to check in 1 week, but advised to call and make lab appt if there are issues.

## 2013-02-28 NOTE — Patient Instructions (Signed)
It was good to see you.  I am glad you are feeling better.  Please try the voltaren gel on your foot, also, apply Vaseline twice a day.   Please take your coumadin, one pill tonight, then alternate two pills and one pill every other night.   I want the home health nurse to check your INR in one week, I have written a prescription.  If there is any problems, please call the office to make a lab appointment for one week from today.

## 2013-02-28 NOTE — Assessment & Plan Note (Signed)
Recheck BMET 

## 2013-03-01 LAB — BASIC METABOLIC PANEL
Calcium: 8.7 mg/dL (ref 8.4–10.5)
Glucose, Bld: 79 mg/dL (ref 70–99)
Potassium: 4 mEq/L (ref 3.5–5.3)
Sodium: 142 mEq/L (ref 135–145)

## 2013-03-07 ENCOUNTER — Telehealth: Payer: Self-pay | Admitting: Family Medicine

## 2013-03-07 NOTE — Telephone Encounter (Signed)
Yes, that will be fine. She was seen by Dr. Lula Olszewski last week. If Kendal Hymen has any concerns, I will be available by pager.  Thank you, Taydon Nasworthy M. Olson Lucarelli, M.D.

## 2013-03-07 NOTE — Telephone Encounter (Signed)
Please have patient increase to 5mg  daily. She will need to come to clinic in one week for a follow up INR.  Thank you, Janaki Exley M. Ioanna Colquhoun, M.D.

## 2013-03-07 NOTE — Telephone Encounter (Signed)
Dr. Mikel Cella, is this for a lab appt only in one week?  Ann [name], Ann [name], CMA

## 2013-03-07 NOTE — Telephone Encounter (Signed)
Will fwd to MD.  Jissell Trafton L, CMA  

## 2013-03-07 NOTE — Telephone Encounter (Signed)
PT/INR result = 1.5 -  This is the nurses final visit, patient will need to start coming to [facility] for PT/INR;s.

## 2013-03-07 NOTE — Telephone Encounter (Signed)
Pt's daughter notified of increase in Coumadin.  Instructed to take Coumadin 2.5mg  tablets, take two tablets once a day.  Daughter verbalized understanding.  PT check on lab scheduled for Wednesday 03/14/13.  Will FWD to MD for lab order of PT check.  Lateefa Crosby, Darlyne Russian, CMA

## 2013-03-14 ENCOUNTER — Telehealth: Payer: Self-pay | Admitting: *Deleted

## 2013-03-14 ENCOUNTER — Other Ambulatory Visit: Payer: Medicare Other

## 2013-03-14 MED ORDER — WARFARIN SODIUM 5 MG PO TABS: 5.0000 mg | ORAL_TABLET | Freq: Every day | ORAL | Status: DC

## 2013-03-14 NOTE — Telephone Encounter (Signed)
Daughter states she was told last week that a prescription for warfarin 5 mg was going to be sent into the pharmacy but when she last checked it had not be sent in.  Prescription for Warfarin 5 mg sent to [facility] [address]   Ann [name]

## 2013-03-15 ENCOUNTER — Other Ambulatory Visit: Payer: Medicare Other

## 2013-03-20 ENCOUNTER — Other Ambulatory Visit: Payer: Self-pay | Admitting: Family Medicine

## 2013-03-21 ENCOUNTER — Ambulatory Visit (INDEPENDENT_AMBULATORY_CARE_PROVIDER_SITE_OTHER): Payer: Medicare Other | Admitting: *Deleted

## 2013-03-21 DIAGNOSIS — I82409 Acute embolism and thrombosis of unspecified deep veins of unspecified lower extremity: Secondary | ICD-10-CM

## 2013-03-21 LAB — POCT INR: INR: 1.6

## 2013-03-22 ENCOUNTER — Other Ambulatory Visit: Payer: Self-pay | Admitting: Family Medicine

## 2013-03-22 MED ORDER — CARBAMAZEPINE ER 300 MG PO CP12: 300.0000 mg | ORAL_CAPSULE | Freq: Two times a day (BID) | ORAL | Status: DC

## 2013-03-26 ENCOUNTER — Ambulatory Visit (INDEPENDENT_AMBULATORY_CARE_PROVIDER_SITE_OTHER): Payer: Medicare Other | Admitting: *Deleted

## 2013-03-26 DIAGNOSIS — I82409 Acute embolism and thrombosis of unspecified deep veins of unspecified lower extremity: Secondary | ICD-10-CM

## 2013-03-26 LAB — POCT INR: INR: 5.2

## 2013-03-26 LAB — PROTIME-INR: Prothrombin Time: 38.2 seconds — ABNORMAL HIGH (ref 11.6–15.2)

## 2013-03-30 ENCOUNTER — Ambulatory Visit (INDEPENDENT_AMBULATORY_CARE_PROVIDER_SITE_OTHER): Payer: Medicare Other | Admitting: *Deleted

## 2013-03-30 DIAGNOSIS — I82409 Acute embolism and thrombosis of unspecified deep veins of unspecified lower extremity: Secondary | ICD-10-CM

## 2013-03-30 LAB — POCT INR: INR: 4.2

## 2013-04-05 ENCOUNTER — Telehealth: Payer: Self-pay | Admitting: Family Medicine

## 2013-04-05 ENCOUNTER — Ambulatory Visit (INDEPENDENT_AMBULATORY_CARE_PROVIDER_SITE_OTHER): Payer: Medicare Other | Admitting: *Deleted

## 2013-04-05 DIAGNOSIS — I82409 Acute embolism and thrombosis of unspecified deep veins of unspecified lower extremity: Secondary | ICD-10-CM

## 2013-04-05 LAB — POCT INR: INR: 1.7

## 2013-04-05 NOTE — Telephone Encounter (Signed)
Appointment made for 145 on Monday. Wyatt Haste, RN-BSN

## 2013-04-05 NOTE — Telephone Encounter (Signed)
I have never met [name]. Please have patient's daughter make an office visit to come in and complete the forms together.  Thank you, Derryck Shahan M. Nastasha Reising, M.D.

## 2013-04-05 NOTE — Telephone Encounter (Signed)
Forms placed in Dr. Algis Downs box for completion. Wyatt Haste, RN-BSN

## 2013-04-05 NOTE — Telephone Encounter (Signed)
pts daughter brought in FMLA forms to be completed

## 2013-04-09 ENCOUNTER — Ambulatory Visit (INDEPENDENT_AMBULATORY_CARE_PROVIDER_SITE_OTHER): Payer: Medicare Other | Admitting: Family Medicine

## 2013-04-09 ENCOUNTER — Encounter: Payer: Self-pay | Admitting: Family Medicine

## 2013-04-09 VITALS — BP 127/85 | HR 94 | Temp 97.8°F | Ht 62.0 in | Wt 152.0 lb

## 2013-04-09 DIAGNOSIS — I82402 Acute embolism and thrombosis of unspecified deep veins of left lower extremity: Secondary | ICD-10-CM

## 2013-04-09 DIAGNOSIS — R0989 Other specified symptoms and signs involving the circulatory and respiratory systems: Secondary | ICD-10-CM

## 2013-04-09 DIAGNOSIS — I82409 Acute embolism and thrombosis of unspecified deep veins of unspecified lower extremity: Secondary | ICD-10-CM

## 2013-04-09 DIAGNOSIS — R0600 Dyspnea, unspecified: Secondary | ICD-10-CM | POA: Insufficient documentation

## 2013-04-09 NOTE — Progress Notes (Signed)
Patient ID: SAMEEHA ROCKEFELLER, female   DOB: 03/16/1927, 79 y.o.   MRN: [id number]  [facility] Amber M. Hairford, MD Phone: [phone]   Subjective: HPI: Patient is a 79 y.o. female presenting to clinic today for follow up appointment. Concerns today include FMLA paperwork for her daughter.  1. DVT - On Coumadin for blood clot diagnosed in February 18, 2013. Currently on antibiotic for toe infection (by podiatry) which has affected her INR. Will recheck level today. Daughter has no concerns.   2. SOB- Problem with dyspnea on exertion per daughter's report, but patient denies any problems. Daughter states she will continue to monitor but she wanted [place] to be aware.  Does not need medications refilled at this time.  History Reviewed: Non- smoker. Health Maintenance: UTD  ROS: Please see HPI above.  Objective: Office vital signs reviewed. BP 127/85  Pulse 94  Temp(Src) 97.8 F (36.6 C) (Oral)  Ht 5\' 2"  (1.575 m)  Wt 152 lb (68.947 kg)  BMI 27.79 kg/m2  Physical Examination:  General: Awake, alert. NAD. Daughter at bedside. HEENT: Atraumatic, normocephalic. MMM Pulm: CTAB, no wheezes Cardio: RRR, no murmurs appreciated Extremities: 1+ edema bilaterally Neuro: Grossly intact  Assessment: 79 y.o. female follow up appointment  Plan: See Problem List and After Visit Summary

## 2013-04-09 NOTE — Assessment & Plan Note (Signed)
INR 2.0. Change dose to 5mg  twice weekly and recheck in 2 weeks. FMLA paperwork completed for daughter due to INR checks, seizures and dementia.

## 2013-04-09 NOTE — Patient Instructions (Addendum)
Good to see you! I will see you back in October, or sooner if needed.  Kyce Ging M. Lesa Vandall, M.D.

## 2013-04-09 NOTE — Assessment & Plan Note (Signed)
Will monitor for changes, but now no current concerns

## 2013-04-13 ENCOUNTER — Other Ambulatory Visit: Payer: Medicare Other

## 2013-04-20 ENCOUNTER — Ambulatory Visit (INDEPENDENT_AMBULATORY_CARE_PROVIDER_SITE_OTHER): Payer: Medicare Other | Admitting: *Deleted

## 2013-04-20 DIAGNOSIS — I82890 Acute embolism and thrombosis of other specified veins: Secondary | ICD-10-CM

## 2013-04-20 DIAGNOSIS — I82409 Acute embolism and thrombosis of unspecified deep veins of unspecified lower extremity: Secondary | ICD-10-CM

## 2013-04-27 ENCOUNTER — Ambulatory Visit: Payer: Medicare Other

## 2013-04-30 ENCOUNTER — Ambulatory Visit (INDEPENDENT_AMBULATORY_CARE_PROVIDER_SITE_OTHER): Payer: Medicare Other | Admitting: *Deleted

## 2013-04-30 DIAGNOSIS — I82409 Acute embolism and thrombosis of unspecified deep veins of unspecified lower extremity: Secondary | ICD-10-CM

## 2013-04-30 LAB — POCT INR: INR: 1.6

## 2013-05-08 ENCOUNTER — Ambulatory Visit: Payer: Medicare Other

## 2013-05-11 ENCOUNTER — Ambulatory Visit: Payer: Medicare Other

## 2013-05-11 ENCOUNTER — Other Ambulatory Visit: Payer: Self-pay

## 2013-05-11 MED ORDER — MEMANTINE HCL 10 MG PO TABS: 10.0000 mg | ORAL_TABLET | Freq: Two times a day (BID) | ORAL | Status: DC

## 2013-05-18 ENCOUNTER — Encounter: Payer: Self-pay | Admitting: Family Medicine

## 2013-05-18 ENCOUNTER — Ambulatory Visit (INDEPENDENT_AMBULATORY_CARE_PROVIDER_SITE_OTHER): Payer: Medicare Other | Admitting: *Deleted

## 2013-05-18 ENCOUNTER — Ambulatory Visit (INDEPENDENT_AMBULATORY_CARE_PROVIDER_SITE_OTHER): Payer: Medicare Other | Admitting: Family Medicine

## 2013-05-18 VITALS — BP 120/78 | HR 66 | Temp 97.8°F | Ht 62.0 in | Wt 149.0 lb

## 2013-05-18 DIAGNOSIS — L089 Local infection of the skin and subcutaneous tissue, unspecified: Secondary | ICD-10-CM

## 2013-05-18 DIAGNOSIS — M79609 Pain in unspecified limb: Secondary | ICD-10-CM

## 2013-05-18 DIAGNOSIS — R569 Unspecified convulsions: Secondary | ICD-10-CM

## 2013-05-18 DIAGNOSIS — I82409 Acute embolism and thrombosis of unspecified deep veins of unspecified lower extremity: Secondary | ICD-10-CM

## 2013-05-18 DIAGNOSIS — M79671 Pain in right foot: Secondary | ICD-10-CM

## 2013-05-18 MED ORDER — AMOXICILLIN-POT CLAVULANATE 875-125 MG PO TABS: 1.0000 | ORAL_TABLET | Freq: Two times a day (BID) | ORAL | Status: DC

## 2013-05-18 NOTE — Progress Notes (Signed)
Patient ID: Ann [name], female   DOB: 03/16/1927, 79 y.o.   MRN: [id number]  [facility] Amber M. Hairford, MD Phone: [phone]   Subjective: HPI: Patient is a 79 y.o. female presenting to clinic today for same day appointment for seizures and foot pain.  1. Seizures - Known history of seizures. On Carbamazepine 300mg  BID. She had seizure 2 days ago, witnessed by daughter in bed. Lasted 5 minutes. She might have missed the dose of seizure medication the night before. No recent illnesses, no fevers. Previous seizure was many months ago. She has been followed by neurologist, last visit was in December 2013.  2. Foot pain - She has pain at site of amputated small toe. She is followed by podiatry for this. Currently having some drainage at that site, and was on antibiotic for a while. She walks around in footies at home, and only wears tennis shoes in public.   History Reviewed: Never smoker.  ROS: Please see HPI above.  Objective: Office vital signs reviewed. BP 120/78  Pulse 66  Temp(Src) 97.8 F (36.6 C) (Oral)  Ht 5\' 2"  (1.575 m)  Wt 149 lb (67.586 kg)  BMI 27.25 kg/m2  Physical Examination:  General: Awake, alert. NAD. Confused at baseline. HEENT: Atraumatic, normocephalic. MMM> Pulm: CTAB, no wheezes Cardio: RRR, no murmurs appreciated Abdomen:+BS, soft, nontender, nondistended Extremities: Trace edema.  Left foot: 5th digit absent? (looks like she has a toenail but could be callous) Appears to have a large, dry callous of area with one small area with purulent drainage. Overall, foot has a foul smell to it. TTP at base of 3-5th toes without erythema or edema.  Neuro: Grossly intact  Assessment: 79 y.o. female with seizures and infected foot  Plan: See Problem List and After Visit Summary

## 2013-05-18 NOTE — Patient Instructions (Addendum)
Take antibiotic twice daily. We will call you with an appointment for an MRI of your foot.  Please call Ms. Kosloski' neurologist to make an appointment to discuss seizures. Continue current medication for seizures at this time.  Jozy Mcphearson M. Ceara Wrightson, M.D.

## 2013-05-19 DIAGNOSIS — L089 Local infection of the skin and subcutaneous tissue, unspecified: Secondary | ICD-10-CM | POA: Insufficient documentation

## 2013-05-19 NOTE — Assessment & Plan Note (Signed)
Con't current medication, but family must be consistent with dosing. F/u with Neurologist sooner than previously scheduled. This was only one seizure so we will monitor for increased activity.

## 2013-05-19 NOTE — Assessment & Plan Note (Signed)
Foul smelling discharge from foot with associated pain. Will treat with Augmentin initially and send for MRI to evaluate extent of infection. F/u on Monday. Will possibly need more antibiotics and/or referral to ortho based on what MRI shows. Daughter agrees with plan.

## 2013-05-21 ENCOUNTER — Ambulatory Visit: Payer: Medicare Other

## 2013-05-21 ENCOUNTER — Ambulatory Visit (INDEPENDENT_AMBULATORY_CARE_PROVIDER_SITE_OTHER): Payer: Medicare Other | Admitting: *Deleted

## 2013-05-21 DIAGNOSIS — I82409 Acute embolism and thrombosis of unspecified deep veins of unspecified lower extremity: Secondary | ICD-10-CM

## 2013-05-24 ENCOUNTER — Ambulatory Visit (HOSPITAL_COMMUNITY)
Admission: RE | Admit: 2013-05-24 | Discharge: 2013-05-24 | Disposition: A | Discharge status: Home / Self Care | Payer: Medicare Other | Source: Ambulatory Visit | Attending: Family Medicine | Admitting: Family Medicine

## 2013-05-24 DIAGNOSIS — M79671 Pain in right foot: Secondary | ICD-10-CM

## 2013-05-24 DIAGNOSIS — S98139A Complete traumatic amputation of one unspecified lesser toe, initial encounter: Secondary | ICD-10-CM | POA: Insufficient documentation

## 2013-05-24 DIAGNOSIS — M799 Soft tissue disorder, unspecified: Secondary | ICD-10-CM | POA: Insufficient documentation

## 2013-05-24 DIAGNOSIS — L97509 Non-pressure chronic ulcer of other part of unspecified foot with unspecified severity: Secondary | ICD-10-CM | POA: Insufficient documentation

## 2013-05-24 LAB — CREATININE, SERUM: GFR calc non Af Amer: 24 mL/min — ABNORMAL LOW (ref >90)

## 2013-05-24 MED ORDER — GADOBENATE DIMEGLUMINE 529 MG/ML IV SOLN
7.0000 mL | Freq: Once | INTRAVENOUS | Status: AC | PRN
  Administered 2013-05-24: 7 mL via INTRAVENOUS

## 2013-05-25 ENCOUNTER — Ambulatory Visit (INDEPENDENT_AMBULATORY_CARE_PROVIDER_SITE_OTHER): Payer: Medicare Other | Admitting: *Deleted

## 2013-05-25 ENCOUNTER — Other Ambulatory Visit: Payer: Self-pay | Admitting: Family Medicine

## 2013-05-25 DIAGNOSIS — I82409 Acute embolism and thrombosis of unspecified deep veins of unspecified lower extremity: Secondary | ICD-10-CM

## 2013-05-25 LAB — POCT INR: INR: 3.3

## 2013-05-25 MED ORDER — WARFARIN SODIUM 2.5 MG PO TABS: ORAL_TABLET | ORAL | Status: DC

## 2013-05-28 ENCOUNTER — Other Ambulatory Visit: Payer: Self-pay | Admitting: Emergency Medicine

## 2013-05-31 ENCOUNTER — Ambulatory Visit (INDEPENDENT_AMBULATORY_CARE_PROVIDER_SITE_OTHER): Payer: Medicare Other | Admitting: *Deleted

## 2013-05-31 DIAGNOSIS — I82409 Acute embolism and thrombosis of unspecified deep veins of unspecified lower extremity: Secondary | ICD-10-CM

## 2013-05-31 LAB — POCT INR: INR: 1.7

## 2013-06-07 ENCOUNTER — Other Ambulatory Visit: Payer: Self-pay | Admitting: Family Medicine

## 2013-06-11 ENCOUNTER — Ambulatory Visit: Payer: Medicare Other

## 2013-06-14 ENCOUNTER — Ambulatory Visit (INDEPENDENT_AMBULATORY_CARE_PROVIDER_SITE_OTHER): Payer: Medicare Other | Admitting: *Deleted

## 2013-06-14 DIAGNOSIS — I82409 Acute embolism and thrombosis of unspecified deep veins of unspecified lower extremity: Secondary | ICD-10-CM

## 2013-06-20 ENCOUNTER — Ambulatory Visit (INDEPENDENT_AMBULATORY_CARE_PROVIDER_SITE_OTHER): Payer: Medicare Other | Admitting: *Deleted

## 2013-06-20 DIAGNOSIS — I82409 Acute embolism and thrombosis of unspecified deep veins of unspecified lower extremity: Secondary | ICD-10-CM

## 2013-06-20 DIAGNOSIS — Z23 Encounter for immunization: Secondary | ICD-10-CM

## 2013-06-20 LAB — POCT INR: INR: 1.3

## 2013-06-21 ENCOUNTER — Ambulatory Visit: Payer: Self-pay | Admitting: Podiatry

## 2013-06-27 ENCOUNTER — Ambulatory Visit (INDEPENDENT_AMBULATORY_CARE_PROVIDER_SITE_OTHER): Payer: Medicare Other | Admitting: *Deleted

## 2013-06-27 DIAGNOSIS — I82409 Acute embolism and thrombosis of unspecified deep veins of unspecified lower extremity: Secondary | ICD-10-CM

## 2013-06-27 LAB — POCT INR: INR: 1.5

## 2013-07-04 ENCOUNTER — Ambulatory Visit (INDEPENDENT_AMBULATORY_CARE_PROVIDER_SITE_OTHER): Payer: Medicare Other | Admitting: *Deleted

## 2013-07-04 DIAGNOSIS — I82409 Acute embolism and thrombosis of unspecified deep veins of unspecified lower extremity: Secondary | ICD-10-CM

## 2013-07-04 LAB — POCT INR: INR: 1.8

## 2013-07-13 ENCOUNTER — Other Ambulatory Visit: Payer: Self-pay | Admitting: Family Medicine

## 2013-07-16 ENCOUNTER — Ambulatory Visit: Payer: Medicare Other

## 2013-07-20 ENCOUNTER — Ambulatory Visit (INDEPENDENT_AMBULATORY_CARE_PROVIDER_SITE_OTHER): Payer: Medicare Other | Admitting: *Deleted

## 2013-07-20 ENCOUNTER — Encounter: Payer: Self-pay | Admitting: Family Medicine

## 2013-07-20 ENCOUNTER — Ambulatory Visit (INDEPENDENT_AMBULATORY_CARE_PROVIDER_SITE_OTHER): Payer: Medicare Other | Admitting: Family Medicine

## 2013-07-20 ENCOUNTER — Telehealth: Payer: Self-pay | Admitting: Family Medicine

## 2013-07-20 ENCOUNTER — Other Ambulatory Visit: Payer: Self-pay | Admitting: Family Medicine

## 2013-07-20 VITALS — BP 142/99 | HR 87 | Temp 97.6°F | Wt 151.0 lb

## 2013-07-20 DIAGNOSIS — I82409 Acute embolism and thrombosis of unspecified deep veins of unspecified lower extremity: Secondary | ICD-10-CM

## 2013-07-20 DIAGNOSIS — M79671 Pain in right foot: Secondary | ICD-10-CM

## 2013-07-20 DIAGNOSIS — L089 Local infection of the skin and subcutaneous tissue, unspecified: Secondary | ICD-10-CM

## 2013-07-20 DIAGNOSIS — M79609 Pain in unspecified limb: Secondary | ICD-10-CM

## 2013-07-20 LAB — POCT INR: INR: 1.4

## 2013-07-20 MED ORDER — AMOXICILLIN-POT CLAVULANATE 875-125 MG PO TABS: 1.0000 | ORAL_TABLET | Freq: Two times a day (BID) | ORAL | Status: DC

## 2013-07-20 MED ORDER — TRAMADOL HCL 50 MG PO TABS: 50.0000 mg | ORAL_TABLET | Freq: Four times a day (QID) | ORAL | Status: DC | PRN

## 2013-07-20 MED ORDER — TRAMADOL HCL ER 100 MG PO TB24: 100.0000 mg | ORAL_TABLET | Freq: Every day | ORAL | Status: DC

## 2013-07-20 NOTE — Progress Notes (Signed)
Subjective:     Patient ID: Ann [name], female   DOB: 03/16/1927, 79 y.o.   MRN: [id number]  HPI Comments: Patient complains of Rt 5th toe pain and drainage. Previously seen in clinic and given Rx of Augment but unsure of how much or if she took it because her daughter who normal takes care of her is in the hospital. Granddaughter present today and report soaking the toe in hydrogen peroxide and giving tylenol with little pain relief. She did get MRI of her foot after last visit.     Review of Systems  Constitutional: Negative for fever and appetite change.       Objective:   Physical Exam  Constitutional: She appears well-developed and well-nourished.  Cardiovascular: Normal rate, regular rhythm and normal heart sounds.   Pulmonary/Chest: Breath sounds normal. She is in respiratory distress.  Skin:  3cm raised indurated area at base of rt 5th toe. Skin desquamation and minimal fluid/pus draining. Surround warmth and moderate tenderness     Assessment/Plan:      See Problem Focused Assessment & Plan

## 2013-07-20 NOTE — Progress Notes (Signed)
Pt's granddaughter, [name], brought pt in for INR check.  [name] expressed concern about foot wound and pt was seen by Dr. Gayla Doss.  [name] was given warfarin dosing instructions and expressed understanding of pill sizes and doses.  She also expressed concern for the care of her grandmother and concerns about her mother's abilities to care for pt.  Return for lab Tues 07-24-13 and appt made with Dr. Mikel Cella on 08-06-13 [name] will bring pt to appts.

## 2013-07-20 NOTE — Patient Instructions (Signed)
It was great seeing you today. Below is a list of the things we talked about:   1. Soak right foot in warm water with dish detergent twice a day 2. Take Ultram once a day for pain   Please bring all your medications to every doctors visit  Sign up for My Chart to have easy access to your labs results, and communication with your Primary care physician.   Please check-out at the front desk before leaving the clinic.   Make an appointment in 1-2 weeks to be seen for your foot wound.   I look forward to talking with you again at our next visit. If you have any questions or concerns before then, please call the clinic at [phone].  Take Care,   Dr Wenda Low

## 2013-07-20 NOTE — Telephone Encounter (Signed)
Pt called and needs a refill on her amoxicillin for her foot. It seems to be coming back. JW

## 2013-07-20 NOTE — Telephone Encounter (Signed)
Patient needs her INR checked prior to starting antibiotics. Please having her make a lab appointment. If she thinks the foot is getting worse, she should probably be seen by doctor.  Thank you, Amber M. Hairford, M.D.

## 2013-07-20 NOTE — Telephone Encounter (Signed)
Has INR appt today.  Will forward to [place]. [place], Maryjo [place]

## 2013-07-24 ENCOUNTER — Telehealth: Payer: Self-pay | Admitting: Family Medicine

## 2013-07-24 ENCOUNTER — Ambulatory Visit (INDEPENDENT_AMBULATORY_CARE_PROVIDER_SITE_OTHER): Payer: Medicare Other | Admitting: *Deleted

## 2013-07-24 ENCOUNTER — Ambulatory Visit: Payer: Medicare Other

## 2013-07-24 DIAGNOSIS — I82409 Acute embolism and thrombosis of unspecified deep veins of unspecified lower extremity: Secondary | ICD-10-CM

## 2013-07-24 LAB — POCT INR: INR: 1.6

## 2013-07-24 NOTE — Telephone Encounter (Signed)
Per Dr. Mikel Cella she still has not received the paperwork for this.  If they call back please ask for them to resend forms.  There is no number listed or a reference number to try and return call to them. Jazmin Hartsell,CMA

## 2013-07-24 NOTE — Telephone Encounter (Signed)
Customer service with [employer] wanted to know why Dr Mikel Cella had not completed the paperwork to authorize 24 hr care for pt. Apparently her daughter has called insurance wanting to know why there had been no authorization completed. According to Tia-she took the call last week from the insurance company.Insurance wanted Dr Mikel Cella to call and initiate the process. It was explained that papework should be sent to Dr Mikel Cella. She would not initiate the process. This was explained to insurance again today

## 2013-07-24 NOTE — Telephone Encounter (Signed)
Pt granddaughter called. According to her, the insurance company says Dr Mikel Cella has to send something to the insurance co stating she needs home health care.   Chief of Staff line [phone] Please advise

## 2013-07-27 NOTE — Telephone Encounter (Signed)
Tried calling granddaughter back but there was no answer and no room on her VM.  Spoke with [facility] medicare and they were unable to tell me anything about home health care coverage except that they don't office 24 hour care.  He was not sure what paperwork they were suppposed to send to Dr. Mikel Cella.  Need to find out if they have been contacted at all by a home health agency.  Ann [name],CMA

## 2013-07-30 ENCOUNTER — Ambulatory Visit (INDEPENDENT_AMBULATORY_CARE_PROVIDER_SITE_OTHER): Payer: Medicare Other | Admitting: *Deleted

## 2013-07-30 DIAGNOSIS — I82409 Acute embolism and thrombosis of unspecified deep veins of unspecified lower extremity: Secondary | ICD-10-CM

## 2013-07-31 ENCOUNTER — Other Ambulatory Visit: Payer: Self-pay | Admitting: Family Medicine

## 2013-07-31 MED ORDER — WARFARIN SODIUM 2.5 MG PO TABS: ORAL_TABLET | ORAL | Status: DC

## 2013-08-06 ENCOUNTER — Ambulatory Visit (INDEPENDENT_AMBULATORY_CARE_PROVIDER_SITE_OTHER): Payer: Medicare Other | Admitting: *Deleted

## 2013-08-06 ENCOUNTER — Encounter: Payer: Self-pay | Admitting: Family Medicine

## 2013-08-06 ENCOUNTER — Telehealth: Payer: Self-pay | Admitting: Family Medicine

## 2013-08-06 ENCOUNTER — Ambulatory Visit (INDEPENDENT_AMBULATORY_CARE_PROVIDER_SITE_OTHER): Payer: Medicare Other | Admitting: Family Medicine

## 2013-08-06 VITALS — BP 162/110 | HR 100 | Temp 97.6°F | Ht 62.0 in | Wt 152.0 lb

## 2013-08-06 DIAGNOSIS — I82409 Acute embolism and thrombosis of unspecified deep veins of unspecified lower extremity: Secondary | ICD-10-CM

## 2013-08-06 DIAGNOSIS — R2681 Unsteadiness on feet: Secondary | ICD-10-CM

## 2013-08-06 DIAGNOSIS — F0390 Unspecified dementia without behavioral disturbance: Secondary | ICD-10-CM

## 2013-08-06 DIAGNOSIS — L97519 Non-pressure chronic ulcer of other part of right foot with unspecified severity: Secondary | ICD-10-CM

## 2013-08-06 DIAGNOSIS — L089 Local infection of the skin and subcutaneous tissue, unspecified: Secondary | ICD-10-CM

## 2013-08-06 DIAGNOSIS — L97509 Non-pressure chronic ulcer of other part of unspecified foot with unspecified severity: Secondary | ICD-10-CM

## 2013-08-06 NOTE — Telephone Encounter (Signed)
Granddaughter came in after the appt ended.  She handles the grandmothers care. The issue of home health care: pt doesn't need 24 hr care because daughter is at home at night. With the pt taking pain meds and blood thinner, she needs about home health care during the day--about 32 hours a week.  The phone # for Medicare Complete/[employer] is [phone]/ Reference number: [id number] Please contact [name] when this is handled

## 2013-08-06 NOTE — Patient Instructions (Signed)
We will call you with the ortho appointment to have her foot checked.  You do not need to call the foot doctor right now.   Keep the area clean.   Recheck her INR next week.  Amber M. Hairford, M.D.

## 2013-08-06 NOTE — Progress Notes (Signed)
Patient ID: Ann [name], female   DOB: 03/16/1927, 79 y.o.   MRN: [id number]    Subjective: HPI: Patient is a 79 y.o. female presenting to clinic today for follow up for foot infection. Concerns today include foot infection  1. Foot infection- Started antibiotic on Friday. Day 3, no missed doses. Daughter has not noticed any changes. Has not been to [facility] recently. Last visit was supposed to be in October. Hasn't been back since July. She is doing foot soaks. Pain is intermittent. Pain medicine prn.   2. INR- Goal of 2-3, currently 2.5. She has had a difficult time keeping INR in range. Unsure if this is caregiver compliance, low health literacy, decreased dietary adherence or a combo of all of the above. I have discussed with daughter before this risks/benefits of stopping Coumadin and she has stated in the past that she would like for Ann [name] to continue it despite frequent INR checks and difficulty obtaining perfect INR goal.  History Reviewed: Non smoker. Health Maintenance: UTD  ROS: Please see HPI above.  Objective: Office vital signs reviewed. BP 162/110  Pulse 100  Temp(Src) 97.6 F (36.4 C) (Oral)  Ht 5\' 2"  (1.575 m)  Wt 152 lb (68.947 kg)  BMI 27.79 kg/m2  Physical Examination:  General: Awake, alert. NAD. Daughter at bedside. Non-toxic appearing HEENT: Atraumatic, normocephalic. Missing multiple teeth Pulm: CTAB, no wheezes Cardio: RRR, no murmurs appreciated Extremities: Trace bilateral edema. Right foot with large, malodorus, firm callus on lateral side. Large scab appears to be peeling off and moist. No signs of pus or increased redness. TTP of lateral aspect of foot. Intact distal pulses Neuro: Grossly intact  Assessment: 79 y.o. female follow up  Plan: See Problem List and After Visit Summary

## 2013-08-07 NOTE — Telephone Encounter (Signed)
Ambulatory order placed. Dx includes dementia, unsteady gait. Attending is Todd McDiarmid.  Thanks! Idalie Canto M. Sashia Campas, M.D.

## 2013-08-07 NOTE — Telephone Encounter (Signed)
Spoke with [place] with insurance and we just need to write a rx for home health.  There needs to be a medical reason why she needs this and not because she is home alone.  We need to fax this to home health agency [facility]) or family can pick up and take to them.  The home health agency will then contact insurance and finish the process.  Jazmin Hartsell,CMA

## 2013-08-08 NOTE — Telephone Encounter (Signed)
[facility] [address] [place], [place] [address] [phone]

## 2013-08-08 NOTE — Telephone Encounter (Signed)
LVM with granddaughter to call back with a fax number for this company.  I have looked online for them but have had no luck finding them.  Jazmin Hartsell,CMA

## 2013-08-08 NOTE — Assessment & Plan Note (Signed)
I do not think her foot is currently infected. Last MRI was 2 months ago which did not show osteo. She is on antibiotics, which she should continue for now. Will refer to ortho for better evaluation and consideration of debridement given her pain and constant concern of the foot.

## 2013-08-08 NOTE — Telephone Encounter (Signed)
LVM with granddaughter that order was faxed to company along with insurance and demographics.  Ann [name],CMA

## 2013-08-08 NOTE — Assessment & Plan Note (Signed)
Pt's family wishes to continue with coumadin. She is at goal today, although she is on antibiotic and we just had a holiday weekend and she did not watch her diet. I am not sure how to keep her at goal. Will con't current dose and recheck next week. I think we will have to have the discussion again with family about what to do if her INR become erratic again.

## 2013-08-13 ENCOUNTER — Ambulatory Visit (INDEPENDENT_AMBULATORY_CARE_PROVIDER_SITE_OTHER): Payer: Medicare Other | Admitting: *Deleted

## 2013-08-13 DIAGNOSIS — I82409 Acute embolism and thrombosis of unspecified deep veins of unspecified lower extremity: Secondary | ICD-10-CM

## 2013-08-17 ENCOUNTER — Ambulatory Visit: Payer: Medicare Other

## 2013-08-21 ENCOUNTER — Ambulatory Visit (INDEPENDENT_AMBULATORY_CARE_PROVIDER_SITE_OTHER): Payer: Medicare Other | Admitting: *Deleted

## 2013-08-21 DIAGNOSIS — I82409 Acute embolism and thrombosis of unspecified deep veins of unspecified lower extremity: Secondary | ICD-10-CM

## 2013-08-22 ENCOUNTER — Telehealth: Payer: Self-pay | Admitting: Family Medicine

## 2013-08-22 NOTE — Telephone Encounter (Signed)
Pt called and wanted an update on her referral to an ortho doctor. jw

## 2013-08-28 ENCOUNTER — Ambulatory Visit: Payer: Medicare Other

## 2013-08-29 ENCOUNTER — Other Ambulatory Visit: Payer: Self-pay | Admitting: *Deleted

## 2013-08-29 ENCOUNTER — Encounter: Payer: Self-pay | Admitting: Vascular Surgery

## 2013-08-29 DIAGNOSIS — I70269 Atherosclerosis of native arteries of extremities with gangrene, unspecified extremity: Secondary | ICD-10-CM

## 2013-08-31 ENCOUNTER — Ambulatory Visit (HOSPITAL_COMMUNITY)
Admission: RE | Admit: 2013-08-31 | Discharge: 2013-08-31 | Disposition: A | Discharge status: Home / Self Care | Payer: Medicare Other | Source: Ambulatory Visit | Attending: Vascular Surgery | Admitting: Vascular Surgery

## 2013-08-31 ENCOUNTER — Encounter (INDEPENDENT_AMBULATORY_CARE_PROVIDER_SITE_OTHER): Payer: Self-pay

## 2013-08-31 ENCOUNTER — Encounter: Payer: Self-pay | Admitting: Pediatrics

## 2013-08-31 ENCOUNTER — Ambulatory Visit (INDEPENDENT_AMBULATORY_CARE_PROVIDER_SITE_OTHER): Payer: Medicare Other | Admitting: Vascular Surgery

## 2013-08-31 ENCOUNTER — Ambulatory Visit (INDEPENDENT_AMBULATORY_CARE_PROVIDER_SITE_OTHER)
Admission: RE | Admit: 2013-08-31 | Discharge: 2013-08-31 | Disposition: A | Discharge status: Home / Self Care | Payer: Medicare Other | Source: Ambulatory Visit | Attending: Vascular Surgery | Admitting: Vascular Surgery

## 2013-08-31 ENCOUNTER — Encounter: Payer: Self-pay | Admitting: Vascular Surgery

## 2013-08-31 VITALS — BP 106/81 | HR 88 | Resp 16 | Ht 59.0 in | Wt 151.0 lb

## 2013-08-31 DIAGNOSIS — I998 Other disorder of circulatory system: Secondary | ICD-10-CM

## 2013-08-31 DIAGNOSIS — I70269 Atherosclerosis of native arteries of extremities with gangrene, unspecified extremity: Secondary | ICD-10-CM

## 2013-08-31 DIAGNOSIS — M79609 Pain in unspecified limb: Secondary | ICD-10-CM | POA: Insufficient documentation

## 2013-08-31 DIAGNOSIS — I999 Unspecified disorder of circulatory system: Secondary | ICD-10-CM

## 2013-08-31 DIAGNOSIS — L97909 Non-pressure chronic ulcer of unspecified part of unspecified lower leg with unspecified severity: Secondary | ICD-10-CM | POA: Insufficient documentation

## 2013-08-31 NOTE — Progress Notes (Signed)
  Referred by:  Amber M Hairford, MD [address] [place], [place] [address]  Reason for referral: R foot ischemia  History of Present Illness  Ann [name] is a 79 y.o. (03/16/1927) female who presents with chief complaint: poorly healing R foot amputation site.  The patient is demented and her memory is limited, so he history is obtained from her caregiver.  Apparently some time this last year he R 5th toe was amputated.  The patient developed a large callus over the 5th toe site.  The patient does not ambulate much so she does not have much intermittent claudication.  She denies rest pain.  Pain is described as sharp, severity 3-6/10, and associated with manipulating the callus over the 5th toe amp. site.  Patient has attempted to treat this pain with rest and pain rx.  Atherosclerotic risk factors include: HTN & hyperlipidemia.  Past Medical History  Diagnosis Date  . Hypertension   . Dementia   . Hyperlipidemia   . Seizures   . Metatarsal bone fracture     Right - base of 5th  . DVT of lower extremity (deep venous thrombosis) 02/2013    Left lower extremity, subacute  . DVT (deep venous thrombosis)     Past Surgical History  Procedure Laterality Date  . Toe surgery      right 5th toe amputation    History   Social History  . Marital Status: Widowed    Spouse Name: N/A    Number of Children: N/A  . Years of Education: N/A   Occupational History  . Not on file.   Social History Main Topics  . Smoking status: Never Smoker   . Smokeless tobacco: Never Used  . Alcohol Use: Yes  . Drug Use: No  . Sexual Activity: Not on file   Other Topics Concern  . Not on file   Social History Narrative  . No narrative on file    Family History  Problem Relation Age of Onset  . Hypertension Mother   . Arthritis Father   . Heart disease Daughter     Heart Disease before age 60  . Hyperlipidemia Daughter   . Hypertension Daughter     Current Outpatient Prescriptions  on File Prior to Visit  Medication Sig Dispense Refill  . amLODipine (NORVASC) 10 MG tablet TAKE 1 TABLET ONCE DAILY.  30 tablet  11  . amoxicillin-clavulanate (AUGMENTIN) 875-125 MG per tablet Take 1 tablet by mouth 2 (two) times daily.  28 tablet  0  . aspirin 81 MG chewable tablet Chew 1 tablet (81 mg total) by mouth daily.  30 tablet  0  . atorvastatin (LIPITOR) 20 MG tablet Take 20 mg by mouth daily.      . carbamazepine (CARBATROL) 300 MG 12 hr capsule TAKE ONE CAPSULE BY MOUTH TWICE A DAY  60 capsule  2  . diclofenac sodium (VOLTAREN) 1 % GEL Apply 2 g topically 4 (four) times daily. To foot  100 g  2  . enoxaparin (LOVENOX) 100 MG/ML injection Inject 1 mL (100 mg total) into the skin daily. On 02/23/13 only  1 mL  0  . haloperidol (HALDOL) 0.5 MG tablet Take 1 tablet (0.5 mg total) by mouth 3 (three) times daily as needed.  90 tablet  0  . hydrochlorothiazide (HYDRODIURIL) 25 MG tablet TAKE ONE TABLET DAILY  30 tablet  5  . memantine (NAMENDA) 10 MG tablet Take 1 tablet (10 mg total) by   mouth 2 (two) times daily.  180 tablet  0  . metoprolol succinate (TOPROL-XL) 50 MG 24 hr tablet TAKE 2 TABLETS (100 MG TOTAL) BY MOUTH DAILY. TAKE WITH OR IMMEDIATELY FOLLOWING A MEAL.  60 tablet  5  . traMADol (ULTRAM) 50 MG tablet Take 1 tablet (50 mg total) by mouth every 6 (six) hours as needed.  30 tablet  1  . warfarin (COUMADIN) 2.5 MG tablet TAKE 1/2-2 TABLETS BY MOUTH AS DIRECTED.  100 tablet  0  . warfarin (COUMADIN) 5 MG tablet Take 1 tablet (5 mg total) by mouth daily.  30 tablet  1   No current facility-administered medications on file prior to visit.    No Known Allergies  REVIEW OF SYSTEMS:  (Positives checked otherwise negative)  CARDIOVASCULAR:  [] chest pain, [] chest pressure, [] palpitations, [] shortness of breath when laying flat, [] shortness of breath with exertion,  [] pain in feet when walking, [x] pain in feet when laying flat, [] history of blood clot in veins (DVT), []  history of phlebitis, [] swelling in legs, [] varicose veins  PULMONARY:  [] productive cough, [] asthma, [] wheezing  NEUROLOGIC:  [] weakness in arms or legs, [] numbness in arms or legs, [] difficulty speaking or slurred speech, [] temporary loss of vision in one eye, [] dizziness  HEMATOLOGIC:  [] bleeding problems, [] problems with blood clotting too easily  MUSCULOSKEL:  [] joint pain, [] joint swelling  GASTROINTEST:  [] vomiting blood, [] blood in stool     GENITOURINARY:  [] burning with urination, [] blood in urine  PSYCHIATRIC:  [] history of major depression  INTEGUMENTARY:  [] rashes, [] ulcers  CONSTITUTIONAL:  [] fever, [] chills  For VQI Use Only  PRE-ADM LIVING: Home  AMB STATUS: Ambulatory  CAD Sx: None  PRIOR CHF: None  STRESS TEST: [x] No, [ ] Normal, [ ] + ischemia, [ ] + MI, [ ] Both  Physical Examination  Filed Vitals:   08/31/13 1512  BP: 106/81  Pulse: 88  Resp: 16  Height: 4' 11" (1.499 m)  Weight: 151 lb (68.493 kg)  SpO2: 100%   Body mass index is 30.48 kg/(m^2).  General: A&O x 3, WD, elderly, somewhat non-responsive  Head: [place]/AT  Ear/Nose/Throat: Hearing grossly intact, nares w/o erythema or drainage, oropharynx w/o Erythema/Exudate, Mallampati score: 2  Eyes: PERRLA, EOMI  Neck: Supple, no nuchal rigidity, no palpable LAD  Pulmonary: Sym exp, good air movt, CTAB, no rales, rhonchi, & wheezing  Cardiac: RRR, Nl S1, S2, no Murmurs, rubs or gallops  Vascular: Vessel Right Left  Radial Not Palpable Not Palpable  Brachial Not Palpable NotPalpable  Carotid Palpable, without bruit Palpable, without bruit  Aorta Not palpable N/A  Femoral Palpable Palpable  Popliteal Not palpable Not palpable  PT Not Palpable Not Palpable  DP Not Palpable Not Palpable   Gastrointestinal: soft, NTND, -G/R, - HSM, - masses, - CVAT B, no AAA palp.  Musculoskeletal: M/S 5/5 throughout , Extremities without ischemic changes , R 5th toe: large  firm callus debrided sharply, extensive callus overlying R 5th toe amp site with some dry gangrene proximal to callus  Neurologic: unable to fully test due to poor pt cooperation, Pain and light touch intact in extremities grossly intact, Motor exam as listed above  Psychiatric: Judgment not intact, Mood & affect cannot be full tested due to poor communication  Dermatologic: See M/S exam for extremity exam, no rashes   otherwise noted  Lymph : No Cervical, Axillary, or Inguinal lymphadenopathy   Non-Invasive Vascular Imaging  ABI (Date: 08/31/2013)  R: 0.58 (), DP: mono, PT: bi  L: 0.66 (), DP: mono, PT: mono  RLE arterial duplex (08/31/2013)  Biphasic waveforms of low magnitude throughout suggestive of proximal stenosis  Medical Decision Making  Ann [name] is a 79 y.o. female who presents with: RLE critical limb ischemia, BLE PAD   Given the patient's limited function status and age, I doubt this patient is a surgical candidate.  Her arterial duplex suggests some iliac arterial disease, so endovascular intervention in this patient might be helpful.  I discussed with the patient the natural history of critical limb ischemia: 25% require amputation in one year, 50% are able to maintain their limbs in one year, and 25-30% die in one year due to comorbidities.  Given the limb threatening status of this patient, I recommend an aggressive work up including proceeding with an: Aortogram, Bilateral runoff and possible intervention RLE I discussed with the patient the nature of angiographic procedures, especially the limited patencies of any endovascular intervention. The patient is aware of that the risks of an angiographic procedure include but are not limited to: bleeding, infection, access site complications, embolization, rupture of treated vessel, dissection, possible need for emergent surgical intervention, and possible need for surgical procedures to treat the patient's  pathology. The patient is aware of the risks and agrees to proceed.  The procedure is scheduled for: 8 JAN 15.  I discussed in depth with the patient the nature of atherosclerosis, and emphasized the importance of maximal medical management including strict control of blood pressure, blood glucose, and lipid levels, antiplatelet agents, obtaining regular exercise, and cessation of smoking.  The patient is aware that without maximal medical management the underlying atherosclerotic disease process will progress, limiting the benefit of any interventions. The patient is currently not on a statin.  I will try to obtain a lipid panel the day of the procedure. The patient is currently on an anti-platelet: ASA.  She is also on warfarin, which will need to be temporarily held.  Thank you for allowing us to participate in this patient's care.  Brian Chen, MD [facility] Office: [phone] Pager: [phone]  08/31/2013, 4:39 PM   

## 2013-09-03 NOTE — Telephone Encounter (Signed)
Spoke with daughter regarding status of her home health referral.  She is unsure of what the status is thus far.  Will have pt's granddaughter amanda call to give [place] an update.  Daughter [name] states that she is pretty sure that pt was approved for home health til at least February.  Will document this in the referral also but waiting to confirm with [facility].  Jazmin Hartsell,CMA

## 2013-09-10 ENCOUNTER — Other Ambulatory Visit: Payer: Self-pay

## 2013-09-11 ENCOUNTER — Encounter (HOSPITAL_COMMUNITY): Payer: Self-pay | Admitting: Pharmacy Technician

## 2013-09-13 ENCOUNTER — Other Ambulatory Visit: Payer: Self-pay | Admitting: *Deleted

## 2013-09-13 ENCOUNTER — Telehealth: Payer: Self-pay | Admitting: Vascular Surgery

## 2013-09-13 ENCOUNTER — Encounter (HOSPITAL_COMMUNITY)
Admission: RE | Disposition: A | Discharge status: Home / Self Care | Payer: Self-pay | Source: Ambulatory Visit | Attending: Vascular Surgery

## 2013-09-13 ENCOUNTER — Ambulatory Visit (HOSPITAL_COMMUNITY)
Admission: RE | Admit: 2013-09-13 | Discharge: 2013-09-13 | Disposition: A | Discharge status: Home / Self Care | Payer: Medicare Other | Source: Ambulatory Visit | Attending: Vascular Surgery | Admitting: Vascular Surgery

## 2013-09-13 DIAGNOSIS — T8189XA Other complications of procedures, not elsewhere classified, initial encounter: Secondary | ICD-10-CM | POA: Insufficient documentation

## 2013-09-13 DIAGNOSIS — F0390 Unspecified dementia without behavioral disturbance: Secondary | ICD-10-CM | POA: Insufficient documentation

## 2013-09-13 DIAGNOSIS — E785 Hyperlipidemia, unspecified: Secondary | ICD-10-CM | POA: Insufficient documentation

## 2013-09-13 DIAGNOSIS — I10 Essential (primary) hypertension: Secondary | ICD-10-CM | POA: Insufficient documentation

## 2013-09-13 DIAGNOSIS — Z86718 Personal history of other venous thrombosis and embolism: Secondary | ICD-10-CM | POA: Insufficient documentation

## 2013-09-13 DIAGNOSIS — R569 Unspecified convulsions: Secondary | ICD-10-CM | POA: Insufficient documentation

## 2013-09-13 DIAGNOSIS — I70209 Unspecified atherosclerosis of native arteries of extremities, unspecified extremity: Secondary | ICD-10-CM

## 2013-09-13 DIAGNOSIS — Y835 Amputation of limb(s) as the cause of abnormal reaction of the patient, or of later complication, without mention of misadventure at the time of the procedure: Secondary | ICD-10-CM | POA: Insufficient documentation

## 2013-09-13 DIAGNOSIS — T8189XD Other complications of procedures, not elsewhere classified, subsequent encounter: Secondary | ICD-10-CM

## 2013-09-13 HISTORY — PX: ABDOMINAL AORTAGRAM: SHX5454

## 2013-09-13 LAB — POCT I-STAT, CHEM 8
BUN: 17 mg/dL (ref 6–23)
CALCIUM ION: 1.18 mmol/L (ref 1.13–1.30)
Chloride: 100 mEq/L (ref 96–112)
Creatinine, Ser: 1 mg/dL (ref 0.50–1.10)
Glucose, Bld: 96 mg/dL (ref 70–99)
HEMATOCRIT: 40 % (ref 36.0–46.0)
Hemoglobin: 13.6 g/dL (ref 12.0–15.0)
Potassium: 4 mEq/L (ref 3.7–5.3)
Sodium: 142 mEq/L (ref 137–147)
TCO2: 29 mmol/L (ref 0–100)

## 2013-09-13 LAB — PROTIME-INR
INR: 1.41 (ref 0.00–1.49)
PROTHROMBIN TIME: 16.9 s — AB (ref 11.6–15.2)

## 2013-09-13 SURGERY — ABDOMINAL AORTAGRAM
Anesthesia: LOCAL

## 2013-09-13 MED ORDER — HYDRALAZINE HCL 20 MG/ML IJ SOLN
INTRAMUSCULAR | Status: AC
  Filled 2013-09-13: qty 1

## 2013-09-13 MED ORDER — ACETAMINOPHEN 325 MG PO TABS: 650.0000 mg | ORAL_TABLET | ORAL | Status: DC | PRN

## 2013-09-13 MED ORDER — SODIUM CHLORIDE 0.9 % IV SOLN: 1.0000 mL/kg/h | INTRAVENOUS | Status: DC

## 2013-09-13 MED ORDER — SODIUM CHLORIDE 0.9 % IV SOLN
INTRAVENOUS | Status: DC
  Administered 2013-09-13: 11:00:00 via INTRAVENOUS

## 2013-09-13 MED ORDER — ONDANSETRON HCL 4 MG/2ML IJ SOLN
4.0000 mg | Freq: Four times a day (QID) | INTRAMUSCULAR | Status: DC | PRN
Start: 2013-09-13 — End: 2013-09-13

## 2013-09-13 MED ORDER — HYDRALAZINE HCL 20 MG/ML IJ SOLN: 10.0000 mg | INTRAMUSCULAR | Status: DC | PRN

## 2013-09-13 MED ORDER — OXYCODONE-ACETAMINOPHEN 5-325 MG PO TABS: 1.0000 | ORAL_TABLET | ORAL | Status: DC | PRN

## 2013-09-13 MED ORDER — LIDOCAINE HCL (PF) 1 % IJ SOLN
INTRAMUSCULAR | Status: AC
  Filled 2013-09-13: qty 30

## 2013-09-13 MED ORDER — MORPHINE SULFATE 2 MG/ML IJ SOLN
2.0000 mg | INTRAMUSCULAR | Status: DC | PRN
  Administered 2013-09-13: 2 mg via INTRAVENOUS
  Filled 2013-09-13: qty 1

## 2013-09-13 MED ORDER — HEPARIN (PORCINE) IN NACL 2-0.9 UNIT/ML-% IJ SOLN
INTRAMUSCULAR | Status: AC
  Filled 2013-09-13: qty 1000

## 2013-09-13 NOTE — Telephone Encounter (Signed)
Message copied by Margaretmary Eddy on Thu Sep 13, 2013  3:09 PM ------      Message from: Lorin Mercy K      Created: Thu Sep 13, 2013  2:40 PM      Regarding: Schedule                   ----- Message -----         From: Melene Plan, RN         Sent: 09/13/2013   2:24 PM           To: Sharee Pimple, CMA, Fredrich Birks                        ----- Message -----         From: Fransisco Hertz, MD         Sent: 09/13/2013   2:07 PM           To: [name], Melene Plan, RN            LANAH HAGADONE      [id number]      03/16/1927            PROCEDURE:      1.  Right common femoral artery cannulation under ultrasound guidance      2.  Placement of catheter into aorta      3.  Aortogram      4.  Bilateral runoff            Follow-up: 4 weeks            Pt needs referral to [facility], reason: non-reconstructable vascular disease, poor healing in right foot amputation site ------

## 2013-09-13 NOTE — Op Note (Signed)
   OPERATIVE NOTE   PROCEDURE: 1.  Right common femoral artery cannulation under ultrasound guidance 2.  Placement of catheter into aorta 3.  Aortogram 4.  Bilateral runoff  PRE-OPERATIVE DIAGNOSIS: Poor right 5th toe amputation site  POST-OPERATIVE DIAGNOSIS: same as above   SURGEON: Leonides Sake, MD  ANESTHESIA: conscious sedation  ESTIMATED BLOOD LOSS: 50 cc  CONTRAST: 100 cc  FINDING(S):  Aorta: patent, suggestion of ectatic suprarenal segment  Superior mesenteric artery: patent Celiac artery: patent   Right Left  RA patent patent  CIA patent patent  EIA patent patent  IIA patent patent  CFA patent patent  SFA patent patent  PFA patent patent  Pop Patent proximally, occluded distally Patent proximally, occluded distally  Trif Occluded Occluded  AT Occluded Occluded  Pero Occluded Occluded  PT Occluded Occluded  Bilateral ankle demonstrate only reconstituted collateral arteries.  SPECIMEN(S):  none  INDICATIONS:   Ann [name] is a 80 y.o. female who presents with poorly healing right foot 5th toe amputation site.  The patient presents for: aortogram, bilateral leg runoff, and possible intervention.  I discussed with the patient the nature of angiographic procedures, especially the limited patencies of any endovascular intervention.  The patient is aware of that the risks of an angiographic procedure include but are not limited to: bleeding, infection, access site complications, renal failure, embolization, rupture of vessel, dissection, possible need for emergent surgical intervention, possible need for surgical procedures to treat the patient's pathology, and stroke and death.  The patient is aware of the risks and agrees to proceed.  DESCRIPTION: After full informed consent was obtained from the patient, the patient was brought back to the angiography suite.  The patient was placed supine upon the angiography table and connected to monitoring equipment.  The  patient was then given conscious sedation, the amounts of which are documented in the patient's chart.  The patient was prepped and drape in the standard fashion for an angiographic procedure.  At this point, attention was turned to the right groin.  Under ultrasound guidance, the right common femoral artery was cannulated with a micropuncture needle.  The microwire was advanced into the iliac arterial system.  The needle was exchanged for a microsheath, which was loaded into the common femoral artery over the wire.  The microwire was exchanged for a [facility] wire which was advanced into the aorta.  The microsheath was then exchanged for a 5-Fr sheath which was loaded into the common femoral artery.  The Omniflush catheter was then loaded over the wire up to the level of L1.  The catheter was connected to the power injector circuit.  After de-airring and de-clotting the circuit, a power injector aortogram was completed.  The catheter was pulled down into the distal aorta.  An automated bilateral leg runoff was completed.  Based upon the findings, no surgical or endovascular intervention is possible.  COMPLICATIONS: none  CONDITION: stable  Leonides Sake, MD [facility] Office: [phone] Pager: [phone]  09/13/2013, 1:56 PM

## 2013-09-13 NOTE — Interval H&P Note (Signed)
[facility]  History and Physical Update  The patient was interviewed and re-examined.  The patient's previous History and Physical has been reviewed and is unchanged.  There is no change in the plan of care: Aortogram, bilateral leg runoff, possible right leg intervention.  Leonides Sake, MD [facility] Office: [phone] Pager: [phone]  09/13/2013, 10:45 AM

## 2013-09-13 NOTE — Telephone Encounter (Signed)
Tried to call pt to give her appointment information for wound care center (appt 10/02/13 9:45 am at [facility] long wound care center). Pt's mailbox was full. No other number. Called short stay. Nurse said she would give her the appointment information.

## 2013-09-13 NOTE — H&P (View-Only) (Signed)
Referred by:  Hilarie Fredrickson, MD [address] [place] [address]  Reason for referral: R foot ischemia  History of Present Illness  Ann [name] is a 80 y.o. (03/16/1927) female who presents with chief complaint: poorly healing R foot amputation site.  The patient is demented and her memory is limited, so he history is obtained from her caregiver.  Apparently some time this last year he R 5th toe was amputated.  The patient developed a large callus over the 5th toe site.  The patient does not ambulate much so she does not have much intermittent claudication.  She denies rest pain.  Pain is described as sharp, severity 3-6/10, and associated with manipulating the callus over the 5th toe amp. site.  Patient has attempted to treat this pain with rest and pain rx.  Atherosclerotic risk factors include: HTN & hyperlipidemia.  Past Medical History  Diagnosis Date  . Hypertension   . Dementia   . Hyperlipidemia   . Seizures   . Metatarsal bone fracture     Right - base of 5th  . DVT of lower extremity (deep venous thrombosis) 02/2013    Left lower extremity, subacute  . DVT (deep venous thrombosis)     Past Surgical History  Procedure Laterality Date  . Toe surgery      right 5th toe amputation    History   Social History  . Marital Status: Widowed    Spouse Name: N/A    Number of Children: N/A  . Years of Education: N/A   Occupational History  . Not on file.   Social History Main Topics  . Smoking status: Never Smoker   . Smokeless tobacco: Never Used  . Alcohol Use: Yes  . Drug Use: No  . Sexual Activity: Not on file   Other Topics Concern  . Not on file   Social History Narrative  . No narrative on file    Family History  Problem Relation Age of Onset  . Hypertension Mother   . Arthritis Father   . Heart disease Daughter     Heart Disease before age 19  . Hyperlipidemia Daughter   . Hypertension Daughter     Current Outpatient Prescriptions  on File Prior to Visit  Medication Sig Dispense Refill  . amLODipine (NORVASC) 10 MG tablet TAKE 1 TABLET ONCE DAILY.  30 tablet  11  . amoxicillin-clavulanate (AUGMENTIN) 875-125 MG per tablet Take 1 tablet by mouth 2 (two) times daily.  28 tablet  0  . aspirin 81 MG chewable tablet Chew 1 tablet (81 mg total) by mouth daily.  30 tablet  0  . atorvastatin (LIPITOR) 20 MG tablet Take 20 mg by mouth daily.      . carbamazepine (CARBATROL) 300 MG 12 hr capsule TAKE ONE CAPSULE BY MOUTH TWICE A DAY  60 capsule  2  . diclofenac sodium (VOLTAREN) 1 % GEL Apply 2 g topically 4 (four) times daily. To foot  100 g  2  . enoxaparin (LOVENOX) 100 MG/ML injection Inject 1 mL (100 mg total) into the skin daily. On 02/23/13 only  1 mL  0  . haloperidol (HALDOL) 0.5 MG tablet Take 1 tablet (0.5 mg total) by mouth 3 (three) times daily as needed.  90 tablet  0  . hydrochlorothiazide (HYDRODIURIL) 25 MG tablet TAKE ONE TABLET DAILY  30 tablet  5  . memantine (NAMENDA) 10 MG tablet Take 1 tablet (10 mg total) by  mouth 2 (two) times daily.  180 tablet  0  . metoprolol succinate (TOPROL-XL) 50 MG 24 hr tablet TAKE 2 TABLETS (100 MG TOTAL) BY MOUTH DAILY. TAKE WITH OR IMMEDIATELY FOLLOWING A MEAL.  60 tablet  5  . traMADol (ULTRAM) 50 MG tablet Take 1 tablet (50 mg total) by mouth every 6 (six) hours as needed.  30 tablet  1  . warfarin (COUMADIN) 2.5 MG tablet TAKE 1/2-2 TABLETS BY MOUTH AS DIRECTED.  100 tablet  0  . warfarin (COUMADIN) 5 MG tablet Take 1 tablet (5 mg total) by mouth daily.  30 tablet  1   No current facility-administered medications on file prior to visit.    No Known Allergies  REVIEW OF SYSTEMS:  (Positives checked otherwise negative)  CARDIOVASCULAR:  []  chest pain, []  chest pressure, []  palpitations, []  shortness of breath when laying flat, []  shortness of breath with exertion,  []  pain in feet when walking, [x]  pain in feet when laying flat, []  history of blood clot in veins (DVT), []   history of phlebitis, []  swelling in legs, []  varicose veins  PULMONARY:  []  productive cough, []  asthma, []  wheezing  NEUROLOGIC:  []  weakness in arms or legs, []  numbness in arms or legs, []  difficulty speaking or slurred speech, []  temporary loss of vision in one eye, []  dizziness  HEMATOLOGIC:  []  bleeding problems, []  problems with blood clotting too easily  MUSCULOSKEL:  []  joint pain, []  joint swelling  GASTROINTEST:  []  vomiting blood, []  blood in stool     GENITOURINARY:  []  burning with urination, []  blood in urine  PSYCHIATRIC:  []  history of major depression  INTEGUMENTARY:  []  rashes, []  ulcers  CONSTITUTIONAL:  []  fever, []  chills  For VQI Use Only  PRE-ADM LIVING: Home  AMB STATUS: Ambulatory  CAD Sx: None  PRIOR CHF: None  STRESS TEST: [x]  No, [ ]  Normal, [ ]  + ischemia, [ ]  + MI, [ ]  Both  Physical Examination  Filed Vitals:   08/31/13 1512  BP: 106/81  Pulse: 88  Resp: 16  Height: 4\' 11"  (1.499 m)  Weight: 151 lb (68.493 kg)  SpO2: 100%   Body mass index is 30.48 kg/(m^2).  General: A&O x 3, WD, elderly, somewhat non-responsive  Head: /AT  Ear/Nose/Throat: Hearing grossly intact, nares w/o erythema or drainage, oropharynx w/o Erythema/Exudate, Mallampati score: 2  Eyes: PERRLA, EOMI  Neck: Supple, no nuchal rigidity, no palpable LAD  Pulmonary: Sym exp, good air movt, CTAB, no rales, rhonchi, & wheezing  Cardiac: RRR, Nl S1, S2, no Murmurs, rubs or gallops  Vascular: Vessel Right Left  Radial Not Palpable Not Palpable  Brachial Not Palpable NotPalpable  Carotid Palpable, without bruit Palpable, without bruit  Aorta Not palpable N/A  Femoral Palpable Palpable  Popliteal Not palpable Not palpable  PT Not Palpable Not Palpable  DP Not Palpable Not Palpable   Gastrointestinal: soft, NTND, -G/R, - HSM, - masses, - CVAT B, no AAA palp.  Musculoskeletal: M/S 5/5 throughout , Extremities without ischemic changes , R 5th toe: large  firm callus debrided sharply, extensive callus overlying R 5th toe amp site with some dry gangrene proximal to callus  Neurologic: unable to fully test due to poor pt cooperation, Pain and light touch intact in extremities grossly intact, Motor exam as listed above  Psychiatric: Judgment not intact, Mood & affect cannot be full tested due to poor communication  Dermatologic: See M/S exam for extremity exam, no rashes  otherwise noted  Lymph : No Cervical, Axillary, or Inguinal lymphadenopathy   Non-Invasive Vascular Imaging  ABI (Date: 08/31/2013)  R: 0.58 (), DP: mono, PT: bi  L: 0.66 (), DP: mono, PT: mono  RLE arterial duplex (08/31/2013)  Biphasic waveforms of low magnitude throughout suggestive of proximal stenosis  Medical Decision Making  Ann [name] is a 80 y.o. female who presents with: RLE critical limb ischemia, BLE PAD   Given the patient's limited function status and age, I doubt this patient is a surgical candidate.  Her arterial duplex suggests some iliac arterial disease, so endovascular intervention in this patient might be helpful.  I discussed with the patient the natural history of critical limb ischemia: 25% require amputation in one year, 50% are able to maintain their limbs in one year, and 25-30% die in one year due to comorbidities.  Given the limb threatening status of this patient, I recommend an aggressive work up including proceeding with an: Aortogram, Bilateral runoff and possible intervention RLE I discussed with the patient the nature of angiographic procedures, especially the limited patencies of any endovascular intervention. The patient is aware of that the risks of an angiographic procedure include but are not limited to: bleeding, infection, access site complications, embolization, rupture of treated vessel, dissection, possible need for emergent surgical intervention, and possible need for surgical procedures to treat the patient's  pathology. The patient is aware of the risks and agrees to proceed.  The procedure is scheduled for: 8 JAN 15.  I discussed in depth with the patient the nature of atherosclerosis, and emphasized the importance of maximal medical management including strict control of blood pressure, blood glucose, and lipid levels, antiplatelet agents, obtaining regular exercise, and cessation of smoking.  The patient is aware that without maximal medical management the underlying atherosclerotic disease process will progress, limiting the benefit of any interventions. The patient is currently not on a statin.  I will try to obtain a lipid panel the day of the procedure. The patient is currently on an anti-platelet: ASA.  She is also on warfarin, which will need to be temporarily held.  Thank you for allowing [place] to participate in this patient's care.  Leonides Sake, MD [facility] [place] Office: [phone] Pager: [phone]  08/31/2013, 4:39 PM

## 2013-09-13 NOTE — Discharge Instructions (Signed)
APPOINTMENT AT WOUND CARE CENTER 10/02/13 AT 4650PT  Groin Site Care Refer to this sheet in the next few weeks. These instructions provide you with information on caring for yourself after your procedure. Your caregiver may also give you more specific instructions. Your treatment has been planned according to current medical practices, but problems sometimes occur. Call your caregiver if you have any problems or questions after your procedure. HOME CARE INSTRUCTIONS  You may shower 24 hours after the procedure. Remove the bandage (dressing) and gently wash the site with plain soap and water. Gently pat the site dry.  Do not apply powder or lotion to the site.  Do not sit in a bathtub, swimming pool, or whirlpool for 5 to 7 days.  No bending, squatting, or lifting anything over 10 pounds (4.5 kg) as directed by your caregiver.  Inspect the site at least twice daily.  Do not drive home if you are discharged the same day of the procedure. Have someone else drive you.  You may drive 24 hours after the procedure unless otherwise instructed by your caregiver. What to expect:  Any bruising will usually fade within 1 to 2 weeks.  Blood that collects in the tissue (hematoma) may be painful to the touch. It should usually decrease in size and tenderness within 1 to 2 weeks. SEEK IMMEDIATE MEDICAL CARE IF:  You have unusual pain at the groin site or down the affected leg.  You have redness, warmth, swelling, or pain at the groin site.  You have drainage (other than a small amount of blood on the dressing).  You have chills.  You have a fever or persistent symptoms for more than 72 hours.  You have a fever and your symptoms suddenly get worse.  Your leg becomes pale, cool, tingly, or numb.  You have heavy bleeding from the site. Hold pressure on the site. Document Released: 09/25/2010 Document Revised: 11/15/2011 Document Reviewed: 09/25/2010 [facility] Patient Information 2014 [place],  [place].

## 2013-09-14 ENCOUNTER — Telehealth: Payer: Self-pay | Admitting: Vascular Surgery

## 2013-09-14 NOTE — Telephone Encounter (Addendum)
Message copied by Fredrich Birks on Fri Sep 14, 2013  2:40 PM ------      Message from: Melene Plan      Created: Thu Sep 13, 2013  2:24 PM                   ----- Message -----         From: Fransisco Hertz, MD         Sent: 09/13/2013   2:07 PM           To: [name], Melene Plan, RN            Ann [name]      [id number]      03/16/1927            PROCEDURE:      1.  Right common femoral artery cannulation under ultrasound guidance      2.  Placement of catheter into aorta      3.  Aortogram      4.  Bilateral runoff            Follow-up: 4 weeks            Pt needs referral to [facility], reason: non-reconstructable vascular disease, poor healing in right foot amputation site ------  09/14/13: faxed information to [facility] and lm for pt re appt, dpm

## 2013-09-24 ENCOUNTER — Ambulatory Visit: Payer: Medicare Other

## 2013-09-24 ENCOUNTER — Telehealth: Payer: Self-pay | Admitting: Family Medicine

## 2013-09-24 NOTE — Telephone Encounter (Signed)
Spoke with Aram Beecham at [facility] and she had questions regarding the appearance of pt's right 5th toe.  She was unsure if it had been amputated or had fallen off.  I informed her that according to Dr. Nicky Pugh note 08/31/2013 it was amputated in 08/2012.  Aram Beecham just wanted to ensure that she was documenting that this callus was not a new problem and making correct notation.  Jazmin Hartsell,CMA

## 2013-09-24 NOTE — Telephone Encounter (Signed)
Home health care nurse has questions about pts foot care Please advise

## 2013-09-27 ENCOUNTER — Ambulatory Visit (INDEPENDENT_AMBULATORY_CARE_PROVIDER_SITE_OTHER): Payer: Medicare Other | Admitting: *Deleted

## 2013-09-27 DIAGNOSIS — I82409 Acute embolism and thrombosis of unspecified deep veins of unspecified lower extremity: Secondary | ICD-10-CM

## 2013-09-27 DIAGNOSIS — Z7901 Long term (current) use of anticoagulants: Secondary | ICD-10-CM

## 2013-09-27 LAB — PROTIME-INR
INR: 4.09 — ABNORMAL HIGH (ref <1.50)
PROTHROMBIN TIME: 38.1 s — AB (ref 11.6–15.2)

## 2013-09-27 LAB — POCT INR: INR: 5.4

## 2013-09-28 ENCOUNTER — Telehealth: Payer: Self-pay | Admitting: Family Medicine

## 2013-09-28 NOTE — Progress Notes (Signed)
LMOVM for pt to return call .Jacque Garrels Dawn  

## 2013-09-28 NOTE — Progress Notes (Signed)
Can you please call patient's daughter to let her know that I agree with stopping the Coumadin for good. I will be happy to discuss this with her when I am back in the clinic, but I think it will be is Ceira's best interest to stop this medication.  Please clarify with her again that she will NOT give Linell any more coumadin. They will not need to come in for lab checks any more, but I would like to see Samar in clinic within the next few weeks.  Thank you! Raydon Chappuis M. Vilda Zollner, M.D.

## 2013-09-28 NOTE — Telephone Encounter (Signed)
Home health care nurse call from [facility] to let Dr. Mikel Cella know that he has ordered a bath chair for [facility] and that he will be seeing her one more time and train her with the chair. He stated that the daughter was not there but that the grandson was and all he could tell him was that Lecia is not doing well. jw

## 2013-10-01 NOTE — Telephone Encounter (Signed)
Thank you for the information. I will have an attending sign the order when we get it in the office.  [name] needs an appointment soon, if she does not already have one scheduled.  Thanks! Dawud Mays M. Sayla Golonka, M.D.

## 2013-10-02 ENCOUNTER — Encounter (HOSPITAL_BASED_OUTPATIENT_CLINIC_OR_DEPARTMENT_OTHER): Payer: Medicare Other | Attending: General Surgery

## 2013-10-02 DIAGNOSIS — Z7901 Long term (current) use of anticoagulants: Secondary | ICD-10-CM | POA: Insufficient documentation

## 2013-10-02 DIAGNOSIS — Z86718 Personal history of other venous thrombosis and embolism: Secondary | ICD-10-CM | POA: Insufficient documentation

## 2013-10-02 DIAGNOSIS — Z7982 Long term (current) use of aspirin: Secondary | ICD-10-CM | POA: Insufficient documentation

## 2013-10-02 DIAGNOSIS — Z8673 Personal history of transient ischemic attack (TIA), and cerebral infarction without residual deficits: Secondary | ICD-10-CM | POA: Insufficient documentation

## 2013-10-02 DIAGNOSIS — Y835 Amputation of limb(s) as the cause of abnormal reaction of the patient, or of later complication, without mention of misadventure at the time of the procedure: Secondary | ICD-10-CM | POA: Insufficient documentation

## 2013-10-02 DIAGNOSIS — I739 Peripheral vascular disease, unspecified: Secondary | ICD-10-CM | POA: Insufficient documentation

## 2013-10-02 DIAGNOSIS — I10 Essential (primary) hypertension: Secondary | ICD-10-CM | POA: Insufficient documentation

## 2013-10-02 DIAGNOSIS — T8789 Other complications of amputation stump: Secondary | ICD-10-CM | POA: Insufficient documentation

## 2013-10-02 DIAGNOSIS — E785 Hyperlipidemia, unspecified: Secondary | ICD-10-CM | POA: Insufficient documentation

## 2013-10-02 DIAGNOSIS — L84 Corns and callosities: Secondary | ICD-10-CM | POA: Insufficient documentation

## 2013-10-02 DIAGNOSIS — F0390 Unspecified dementia without behavioral disturbance: Secondary | ICD-10-CM | POA: Insufficient documentation

## 2013-10-02 DIAGNOSIS — Z79899 Other long term (current) drug therapy: Secondary | ICD-10-CM | POA: Insufficient documentation

## 2013-10-02 DIAGNOSIS — S98139A Complete traumatic amputation of one unspecified lesser toe, initial encounter: Secondary | ICD-10-CM | POA: Insufficient documentation

## 2013-10-03 NOTE — H&P (Signed)
NAME:  Ann [name], Ann [name] NO.:  [id number]  MEDICAL RECORD NO.:  [id number]  LOCATION:  FOOT                         FACILITY:  [facility]  PHYSICIAN:  Joanne Gavel, M.D.        DATE OF BIRTH:  03/16/1927  DATE OF ADMISSION:  10/02/2013 DATE OF DISCHARGE:                             HISTORY & PHYSICAL   CHIEF COMPLAINT:  Sore, right foot.  HISTORY OF PRESENT ILLNESS:  This is an 80 year old female, nonsmoker and nondiabetic.  She underwent amputation of the right fifth toe in 2004.  Evidently, this wound is never completely healed.  Now, she is complaining of pain several times a day.  The patient is markedly demented.  PAST MEDICAL HISTORY:  Significant for: 1. Hypertension. 2. Hyperlipidemia. 3. Dementia. 4. Seizure disorder. 5. Left DVT. 6. History of CVA. 7. Atrial fibrillation. 8. Severe hard of hearing. 9. She clearly has peripheral arterial disease.  PAST SURGICAL HISTORY:  Fifth toe amputation, right.  SOCIAL HISTORY:  Cigarettes none.  Alcohol, a beer a day.  REVIEW OF SYSTEMS:  The patient has had this problem for a long time. She has seen many different physicians.  In October, she had an MRI, which revealed no abscess and no signs of osteomyelitis.  She has been seen by Vascular Surgery, who determined that she has significant peripheral vascular disease.  She underwent aortogram with runoff, but we have not seen these results.  MEDICATIONS:  Amlodipine, aspirin, atorvastatin, carbamazepine, Voltaren, Lovenox, Haldol, hydrochlorothiazide, Namenda, metoprolol, tramadol, and Coumadin.  ALLERGIES:  None.  REVIEW OF SYSTEMS:  As above.  PHYSICAL EXAMINATION:  VITAL SIGNS:  Temperature 98, pulse 88 and regular, respirations 16, blood pressure 105/76. GENERAL APPEARANCE:  Well developed, well nourished, not aware of surroundings, not very verbal. CHEST:  Clear. HEART:  Appears to be in regular rhythm at present. EXTREMITIES:  Examination of the  extremity reveals un-palpable pulses. ABI cannot be obtained.  On the right foot at the amputation site, there is a 3.0 x 3.0 defect with a sizable amount of very woody callus.  This area is tender and she will not permit me to do any work in this area.  IMPRESSION:  Peripheral vascular disease with unhealed amputation site of very long duration.  PLAN:  We will start with Santyl, but I will withhold opinion until I get the results of the Vascular Surgery workup.     Joanne Gavel, M.D.     RA/MEDQ  D:  10/02/2013  T:  10/03/2013  Job:  [id number]

## 2013-10-07 HISTORY — PX: BELOW KNEE LEG AMPUTATION: SUR23

## 2013-10-09 ENCOUNTER — Encounter (HOSPITAL_BASED_OUTPATIENT_CLINIC_OR_DEPARTMENT_OTHER): Payer: Medicare Other | Attending: General Surgery

## 2013-10-09 DIAGNOSIS — I739 Peripheral vascular disease, unspecified: Secondary | ICD-10-CM | POA: Insufficient documentation

## 2013-10-09 DIAGNOSIS — L97509 Non-pressure chronic ulcer of other part of unspecified foot with unspecified severity: Secondary | ICD-10-CM | POA: Insufficient documentation

## 2013-10-10 ENCOUNTER — Ambulatory Visit: Payer: Medicare Other | Admitting: Family Medicine

## 2013-10-11 ENCOUNTER — Encounter: Payer: Self-pay | Admitting: Vascular Surgery

## 2013-10-12 ENCOUNTER — Encounter: Payer: Medicare Other | Admitting: Vascular Surgery

## 2013-10-17 ENCOUNTER — Ambulatory Visit: Payer: Medicare Other | Admitting: Family Medicine

## 2013-10-17 NOTE — H&P (Signed)
NAME:  TAHJAE, SKLAR NO.:  MEDICAL RECORD NO.:  [id number]  LOCATION:                                 FACILITY:  PHYSICIAN:  Joanne Gavel, M.D.        DATE OF BIRTH:  03/16/1927  DATE OF ADMISSION: DATE OF DISCHARGE:                             HISTORY & PHYSICAL   Dear Dr. Imogene Burn:  We have been looking after Gardiner Coins for several weeks.  I believe the situation has deteriorated.  The foot is now odoriferous.  It is too tender for [place] to do any serious debridement.  I think that some sort of surgical manipulation is imminently necessary.  If you do proceed to a less than complete amputation of the foot, we would be delighted to help in caring for any wounds that are left after surgical manipulation.     Joanne Gavel, M.D.     RA/MEDQ  D:  10/16/2013  T:  10/17/2013  Job:  [id number]

## 2013-10-18 ENCOUNTER — Encounter: Payer: Self-pay | Admitting: Vascular Surgery

## 2013-10-18 ENCOUNTER — Telehealth: Payer: Self-pay | Admitting: Family Medicine

## 2013-10-18 NOTE — Telephone Encounter (Signed)
Aram Beecham informed that coumadin was stopped on 09/27/13.  Ryian Lynde, Maryjo [place]

## 2013-10-18 NOTE — Telephone Encounter (Signed)
Ann [name] with [name] has questions about coumdin. Has it been restored or is Dr waiting until 18 to make that decision? Please advise [phone]

## 2013-10-19 ENCOUNTER — Encounter (INDEPENDENT_AMBULATORY_CARE_PROVIDER_SITE_OTHER): Payer: Self-pay

## 2013-10-19 ENCOUNTER — Ambulatory Visit (INDEPENDENT_AMBULATORY_CARE_PROVIDER_SITE_OTHER): Payer: Medicare Other | Admitting: Vascular Surgery

## 2013-10-19 ENCOUNTER — Encounter: Payer: Self-pay | Admitting: Vascular Surgery

## 2013-10-19 VITALS — BP 112/80 | HR 95 | Ht 59.0 in | Wt 151.0 lb

## 2013-10-19 DIAGNOSIS — I999 Unspecified disorder of circulatory system: Secondary | ICD-10-CM

## 2013-10-19 DIAGNOSIS — I70229 Atherosclerosis of native arteries of extremities with rest pain, unspecified extremity: Secondary | ICD-10-CM

## 2013-10-19 DIAGNOSIS — I998 Other disorder of circulatory system: Secondary | ICD-10-CM

## 2013-10-19 MED ORDER — TRAMADOL HCL 50 MG PO TABS: 50.0000 mg | ORAL_TABLET | Freq: Four times a day (QID) | ORAL | Status: DC | PRN

## 2013-10-19 NOTE — Telephone Encounter (Signed)
Correct. We are not doing coumadin any longer. She can d/c any orders for pt/inr or administration of that medication.  Thank you! Abbott Jasinski M. Anella Nakata, M.D.

## 2013-10-19 NOTE — Progress Notes (Signed)
    Postoperative Visit   History of Present Illness  Ann [name] is a 80 y.o. female who presents for postoperative follow-up from procedure on Date: 09/13/13: diagnostic aortogram with bilateral leg runoff .  The patient's wounds are not healed.  The patient has had decrease in mobility over the last few months.  The patient is able to complete their activities of daily living.  The patient's current symptoms are: continued wound drainage from R 5th toe amp site.  By report, the patient has been discharged from the wound care center.  Past Medical History, Past Surgical History, Social History, Family History, Medications, Allergies, and Review of Systems are unchanged from previous evaluation on 09/13/13.  On ROS: no rest pain, no intermittent claudication as pt has limited mobility, no fever or chills  For VQI Use Only  PRE-ADM LIVING: Home  AMB STATUS: Wheelchair  Physical Examination  Filed Vitals:   10/19/13 0911  BP: 112/80  Pulse: 95  Height: 4\' 11"  (1.499 m)  Weight: 151 lb (68.493 kg)  SpO2: 100%   Body mass index is 30.48 kg/(m^2).  General: A&O x 3, WDWN  Pulmonary: Sym exp, good air movt, CTAB, no rales, rhonchi, & wheezing  Cardiac: RRR, Nl S1, S2, no Murmurs, rubs or gallops  Vascular: Vessel Right Left  Radial Palpable Palpable  Brachial Palpable Palpable  Carotid Palpable, without bruit Palpable, without bruit  Aorta Not palpable N/A  Femoral Palpable Palpable  Popliteal Not palpable Not palpable  PT Not Palpable Not Palpable  DP Not Palpable Not Palpable    Gastrointestinal: soft, NTND, -G/R, - HSM, - masses, - CVAT B  Musculoskeletal: M/S 5/5 throughout , Extremities without ischemic changes except  R 5th toe amp site with hyperkeratotic growth and serous drainage, right groin without hematoma, no echymosis present at cannulation site  Neurologic:  Pain and light touch intact in extremities , Motor exam as listed above  Medical Decision  Making  Ann [name] is a 80 y.o. female who presents s/p diagnostic B leg angiography demonstrating bilateral severe tibial artery disase Based on his angiographic findings, no surgical or endovascular intervention is likely to be useful. The patient states she does NOT want an amputation.   At this point, medical management for her severe tibial artery disease: abx for infection, local wound care, and pan rx as needed.  I refilled this patient's Ultram for pain control.  If she develops rest pain or poorly controlled wounds in right foot, at that point, I would offer her a palliative right BKA.  At this point, she would REFUSE.  Thank you for allowing [place] to participate in this patient's care.  Leonides Sake, MD [facility] Office: [phone] Pager: [phone]

## 2013-10-22 ENCOUNTER — Other Ambulatory Visit: Payer: Self-pay | Admitting: Family Medicine

## 2013-10-24 ENCOUNTER — Other Ambulatory Visit (HOSPITAL_COMMUNITY): Payer: Self-pay | Admitting: Orthopedic Surgery

## 2013-10-24 ENCOUNTER — Encounter: Payer: Self-pay | Admitting: Family Medicine

## 2013-10-24 ENCOUNTER — Ambulatory Visit (INDEPENDENT_AMBULATORY_CARE_PROVIDER_SITE_OTHER): Payer: Medicare Other | Admitting: Family Medicine

## 2013-10-24 VITALS — BP 121/85 | HR 93 | Temp 98.6°F | Resp 16 | Wt 147.0 lb

## 2013-10-24 DIAGNOSIS — B370 Candidal stomatitis: Secondary | ICD-10-CM

## 2013-10-24 DIAGNOSIS — R0602 Shortness of breath: Secondary | ICD-10-CM

## 2013-10-24 DIAGNOSIS — F068 Other specified mental disorders due to known physiological condition: Secondary | ICD-10-CM

## 2013-10-24 MED ORDER — MEMANTINE HCL 10 MG PO TABS: 10.0000 mg | ORAL_TABLET | Freq: Two times a day (BID) | ORAL | Status: DC

## 2013-10-24 MED ORDER — ATORVASTATIN CALCIUM 20 MG PO TABS: 20.0000 mg | ORAL_TABLET | Freq: Every day | ORAL | Status: DC

## 2013-10-24 MED ORDER — NYSTATIN 100000 UNIT/ML MT SUSP: 5.0000 mL | Freq: Four times a day (QID) | OROMUCOSAL | Status: DC

## 2013-10-24 NOTE — Progress Notes (Signed)
Tana Conch, MD Phone: [phone]  Subjective:  Chief complaint-noted  Dementia Patient has been out of her namenda x 2 weeks. Seems to be more confused during this time. She has started to ask for her mother at night time but not during the day.  ROS- no fever/chills/nausea/vomiting  Sore throat/? shortness of breath Daughter states for about 1-2 weeks the patient seems to be breathing a little harder when talking or walking. She says her voice sounds different. The patient will seem to grab at her throat or say it is bothering her.  No history of heart issues (MI or heart failure) or lung issues (asthma or COPD). Has had some cough, congestion during this time. This is not worsening with time but has been stable. Patient does have history of DVT and recently stopped coumadin. No increased swelling in the legs. Does have upcoming R BKA due to a foot infection but patient has not seemed ill such as fever/chills as mentioned above.   ROS-does not complain of chest pain or shortness of breath. Not sleeping on more pillows.  Past Medical History- gangrene right 5th toe with upcoming BKA planned, history DVT recently finished treatment (no history of PE), HLD, HTN, Dementia, history CVA,   Medications- reviewed and updated Current Outpatient Prescriptions  Medication Sig Dispense Refill  . amLODipine (NORVASC) 10 MG tablet Take 10 mg by mouth daily.      Marland Kitchen aspirin 81 MG chewable tablet Chew 81 mg by mouth daily.      Marland Kitchen atorvastatin (LIPITOR) 20 MG tablet Take 1 tablet (20 mg total) by mouth daily.  30 tablet  5  . carbamazepine (CARBATROL) 300 MG 12 hr capsule Take 300 mg by mouth 2 (two) times daily.      . hydrochlorothiazide (HYDRODIURIL) 25 MG tablet Take 25 mg by mouth daily.      . memantine (NAMENDA) 10 MG tablet Take 1 tablet (10 mg total) by mouth 2 (two) times daily.  180 tablet  1  . metoprolol succinate (TOPROL-XL) 50 MG 24 hr tablet Take 50 mg by mouth 2 (two) times daily.  Take with or immediately following a meal.      . nystatin (MYCOSTATIN) 100000 UNIT/ML suspension Take 5 mLs (500,000 Units total) by mouth 4 (four) times daily. Swish in mouth for at least a minute before swallowing. Use x 5-7 days  120 mL  0  . traMADol (ULTRAM) 50 MG tablet Take 1 tablet (50 mg total) by mouth every 6 (six) hours as needed.  50 tablet  0     Objective: BP 121/85  Pulse 93  Temp(Src) 98.6 F (37 C) (Oral)  Resp 16  Wt 147 lb (66.679 kg)  SpO2 99% Gen: NAD, resting comfortably in chair, climbs slowly onto table but no obvious respiratory distress.  CV: RRR no murmurs rubs or gallops HEENT: could nto visualize oropharynx well but on tongue had 2 white plaques that could not be scraped off.  Lungs: CTAB no crackles, wheeze, rhonchi, no respiratory distress when lying patient flat on table  Abdomen: soft/nontender/nondistended Ext: trace edema bilaterally  Walked patient a 1/2 lap around our building with saturations above 95%, did not appear in acute respiratory distress  Assessment/Plan:  Sore throat/? shortness of breath Unclear of complaint but patient seems to indicate irritated throat while daughter thinks patient is more short of breath than normal. Sats 99% and above 95% with walking. RR 16. To me, patient does not appear obviously short of breath  or in any distress. Given stable course over 2 weeks, doubt life threatening illness. Wells criteria with score of 1.5 due to history of DVT but still technically low risk. No recent history of bleeding with hgb less than a month ago greater than 13. No history of lung disease. No chest pain or patient reported shortness of breath to indicate need for ACS evaluation.  No history or indicators of CHF (except bialteral trace edema). Believe CXR is a reasonable starting point with follow up with PCP Dr. Mikel CellaHairford. I did see some white plaques on tongue. Possible oropharyngeal thrush causing throat irritation so will treat with  nystatin swish and swallow.   DEMENTIA, NOT SPECIFIED Worsened off of namenda. Have refilled at this time. Reestablishing a new baseline MMSE would be reasonable at next visit with PCP.    Orders Placed This Encounter  Procedures  . DG Chest 2 View    Standing Status: Future     Number of Occurrences:      Standing Expiration Date: 12/23/2014    Order Specific Question:  Reason for Exam (SYMPTOM  OR DIAGNOSIS REQUIRED)    Answer:  shortness of breath    Order Specific Question:  [facility]?    [facility]    Meds ordered this encounter  Medications  . memantine (NAMENDA) 10 MG tablet    Sig: Take 1 tablet (10 mg total) by mouth 2 (two) times daily.    Dispense:  180 tablet    Refill:  1  . nystatin (MYCOSTATIN) 100000 UNIT/ML suspension    Sig: Take 5 mLs (500,000 Units total) by mouth 4 (four) times daily. Swish in mouth for at least a minute before swallowing. Use x 5-7 days    Dispense:  120 mL    Refill:  0  . atorvastatin (LIPITOR) 20 MG tablet    Sig: Take 1 tablet (20 mg total) by mouth daily.    Dispense:  30 tablet    Refill:  5

## 2013-10-24 NOTE — Patient Instructions (Addendum)
I am not sure why she seems to be grabbing at her throat. Since she seems to be short of breath to you we will check a chest x-ray today.  She does have some yellow plaques on her tongue which could be thrush. Ginette Pitman can also be on the back of the throat (which we had a hard time seeing today). Let's try this nystatin medication to see if it helps.   See Dr. Mikel Cella at her next available-go ahead and schedule today,  Dr. Nils Pyle can also talk to her about the following: Health Maintenance Due  Topic Date Due  . Zostavax  03/16/1989

## 2013-10-24 NOTE — Assessment & Plan Note (Signed)
Worsened off of namenda. Have refilled at this time. Reestablishing a new baseline MMSE would be reasonable at next visit with PCP.

## 2013-10-25 ENCOUNTER — Encounter (HOSPITAL_COMMUNITY): Payer: Self-pay | Admitting: *Deleted

## 2013-10-25 MED ORDER — CEFAZOLIN SODIUM-DEXTROSE 2-3 GM-% IV SOLR
2.0000 g | INTRAVENOUS | Status: AC
  Administered 2013-10-26: 2 g via INTRAVENOUS
  Filled 2013-10-25: qty 50

## 2013-10-25 NOTE — Progress Notes (Addendum)
Spoke with pt daughter, Jelicia Parshall to complete SDW Call since pt has dementia. According to pt daughter, she has a prescription for Namenda and Ultram ( daughter was instructed to administer these medications on  the morning of surgery ; see pre-op instructions). Daughter states that she is having a problem with pt insurance because of discrepancy with pt DOB. Spoke with Autumn, Dr. Audrie Lia nurse, she clarified that it is okay for pt to take Aspirin on DOS. Pt daughter stated that Coumadin was discontinued " at the end of January."

## 2013-10-26 ENCOUNTER — Encounter (HOSPITAL_COMMUNITY)
Admission: RE | Disposition: A | Discharge status: Skilled Nursing Facility | Payer: Self-pay | Source: Ambulatory Visit | Attending: Orthopedic Surgery

## 2013-10-26 ENCOUNTER — Encounter (HOSPITAL_COMMUNITY): Payer: Medicare Other | Admitting: Anesthesiology

## 2013-10-26 ENCOUNTER — Inpatient Hospital Stay (HOSPITAL_COMMUNITY): Payer: Medicare Other | Admitting: Anesthesiology

## 2013-10-26 ENCOUNTER — Inpatient Hospital Stay (HOSPITAL_COMMUNITY)
Admission: RE | Admit: 2013-10-26 | Discharge: 2013-10-29 | DRG: 475 | Disposition: A | Discharge status: Skilled Nursing Facility | Payer: Medicare Other | Source: Ambulatory Visit | Attending: Orthopedic Surgery | Admitting: Orthopedic Surgery

## 2013-10-26 ENCOUNTER — Encounter (HOSPITAL_COMMUNITY): Payer: Self-pay | Admitting: Certified Registered"

## 2013-10-26 DIAGNOSIS — F0390 Unspecified dementia without behavioral disturbance: Secondary | ICD-10-CM | POA: Diagnosis present

## 2013-10-26 DIAGNOSIS — E785 Hyperlipidemia, unspecified: Secondary | ICD-10-CM | POA: Diagnosis present

## 2013-10-26 DIAGNOSIS — L97509 Non-pressure chronic ulcer of other part of unspecified foot with unspecified severity: Secondary | ICD-10-CM | POA: Diagnosis present

## 2013-10-26 DIAGNOSIS — R569 Unspecified convulsions: Secondary | ICD-10-CM | POA: Diagnosis present

## 2013-10-26 DIAGNOSIS — Z89519 Acquired absence of unspecified leg below knee: Secondary | ICD-10-CM

## 2013-10-26 DIAGNOSIS — L03119 Cellulitis of unspecified part of limb: Secondary | ICD-10-CM

## 2013-10-26 DIAGNOSIS — M869 Osteomyelitis, unspecified: Principal | ICD-10-CM | POA: Diagnosis present

## 2013-10-26 DIAGNOSIS — L02619 Cutaneous abscess of unspecified foot: Secondary | ICD-10-CM | POA: Diagnosis present

## 2013-10-26 DIAGNOSIS — I10 Essential (primary) hypertension: Secondary | ICD-10-CM | POA: Diagnosis present

## 2013-10-26 DIAGNOSIS — I96 Gangrene, not elsewhere classified: Secondary | ICD-10-CM | POA: Diagnosis present

## 2013-10-26 HISTORY — PX: AMPUTATION: SHX166

## 2013-10-26 LAB — COMPREHENSIVE METABOLIC PANEL
ALT: 41 U/L — ABNORMAL HIGH (ref 0–35)
AST: 48 U/L — AB (ref 0–37)
Albumin: 3.1 g/dL — ABNORMAL LOW (ref 3.5–5.2)
Alkaline Phosphatase: 126 U/L — ABNORMAL HIGH (ref 39–117)
BILIRUBIN TOTAL: 0.4 mg/dL (ref 0.3–1.2)
BUN: 27 mg/dL — ABNORMAL HIGH (ref 6–23)
CHLORIDE: 102 meq/L (ref 96–112)
CO2: 24 mEq/L (ref 19–32)
Calcium: 9.1 mg/dL (ref 8.4–10.5)
Creatinine, Ser: 1 mg/dL (ref 0.50–1.10)
GFR calc Af Amer: 59 mL/min — ABNORMAL LOW (ref >90)
GFR calc non Af Amer: 51 mL/min — ABNORMAL LOW (ref >90)
Glucose, Bld: 96 mg/dL (ref 70–99)
Potassium: 4.5 mEq/L (ref 3.7–5.3)
Sodium: 142 mEq/L (ref 137–147)
Total Protein: 7.6 g/dL (ref 6.0–8.3)

## 2013-10-26 LAB — CBC
HCT: 33.7 % — ABNORMAL LOW (ref 36.0–46.0)
Hemoglobin: 11.1 g/dL — ABNORMAL LOW (ref 12.0–15.0)
MCH: 29.5 pg (ref 26.0–34.0)
MCHC: 32.9 g/dL (ref 30.0–36.0)
MCV: 89.6 fL (ref 78.0–100.0)
PLATELETS: 288 10*3/uL (ref 150–400)
RBC: 3.76 MIL/uL — ABNORMAL LOW (ref 3.87–5.11)
RDW: 13.7 % (ref 11.5–15.5)
WBC: 4.3 10*3/uL (ref 4.0–10.5)

## 2013-10-26 LAB — PROTIME-INR
INR: 1.25 (ref 0.00–1.49)
Prothrombin Time: 15.4 seconds — ABNORMAL HIGH (ref 11.6–15.2)

## 2013-10-26 LAB — APTT: APTT: 31 s (ref 24–37)

## 2013-10-26 SURGERY — AMPUTATION BELOW KNEE
Anesthesia: General | Laterality: Right

## 2013-10-26 MED ORDER — METOCLOPRAMIDE HCL 5 MG/ML IJ SOLN: 5.0000 mg | Freq: Three times a day (TID) | INTRAMUSCULAR | Status: DC | PRN

## 2013-10-26 MED ORDER — ONDANSETRON HCL 4 MG PO TABS: 4.0000 mg | ORAL_TABLET | Freq: Four times a day (QID) | ORAL | Status: DC | PRN

## 2013-10-26 MED ORDER — HYDROCHLOROTHIAZIDE 25 MG PO TABS
25.0000 mg | ORAL_TABLET | Freq: Every day | ORAL | Status: DC
  Administered 2013-10-26 – 2013-10-29 (×4): 25 mg via ORAL
  Filled 2013-10-26 (×4): qty 1

## 2013-10-26 MED ORDER — OXYCODONE-ACETAMINOPHEN 5-325 MG PO TABS
1.0000 | ORAL_TABLET | ORAL | Status: DC | PRN
  Administered 2013-10-26 – 2013-10-28 (×6): 1 via ORAL
  Filled 2013-10-26 (×5): qty 1

## 2013-10-26 MED ORDER — CARBAMAZEPINE ER 100 MG PO TB12
300.0000 mg | ORAL_TABLET | Freq: Two times a day (BID) | ORAL | Status: DC
  Administered 2013-10-27 – 2013-10-29 (×5): 300 mg via ORAL
  Filled 2013-10-26 (×6): qty 3

## 2013-10-26 MED ORDER — PROPOFOL 10 MG/ML IV BOLUS
INTRAVENOUS | Status: DC | PRN
  Administered 2013-10-26: 150 mg via INTRAVENOUS

## 2013-10-26 MED ORDER — METOPROLOL SUCCINATE ER 50 MG PO TB24
50.0000 mg | ORAL_TABLET | Freq: Two times a day (BID) | ORAL | Status: DC
  Administered 2013-10-27 – 2013-10-29 (×5): 50 mg via ORAL
  Filled 2013-10-26 (×7): qty 1

## 2013-10-26 MED ORDER — FENTANYL CITRATE 0.05 MG/ML IJ SOLN: 25.0000 ug | INTRAMUSCULAR | Status: DC | PRN

## 2013-10-26 MED ORDER — FENTANYL CITRATE 0.05 MG/ML IJ SOLN
INTRAMUSCULAR | Status: AC
  Filled 2013-10-26: qty 5

## 2013-10-26 MED ORDER — CEFAZOLIN SODIUM 1-5 GM-% IV SOLN
1.0000 g | Freq: Four times a day (QID) | INTRAVENOUS | Status: AC
  Administered 2013-10-26 – 2013-10-27 (×3): 1 g via INTRAVENOUS
  Filled 2013-10-26 (×3): qty 50

## 2013-10-26 MED ORDER — MEMANTINE HCL 10 MG PO TABS: 10.0000 mg | ORAL_TABLET | Freq: Two times a day (BID) | ORAL | Status: DC

## 2013-10-26 MED ORDER — LACTATED RINGERS IV SOLN
INTRAVENOUS | Status: DC
  Administered 2013-10-26: 12:00:00 via INTRAVENOUS

## 2013-10-26 MED ORDER — WARFARIN - PHARMACIST DOSING INPATIENT
Freq: Every day | Status: DC
  Administered 2013-10-26: 18:00:00

## 2013-10-26 MED ORDER — OXYCODONE-ACETAMINOPHEN 5-325 MG PO TABS
ORAL_TABLET | ORAL | Status: AC
  Filled 2013-10-26: qty 1

## 2013-10-26 MED ORDER — AMLODIPINE BESYLATE 10 MG PO TABS
10.0000 mg | ORAL_TABLET | Freq: Every day | ORAL | Status: DC
  Administered 2013-10-27 – 2013-10-29 (×3): 10 mg via ORAL
  Filled 2013-10-26 (×3): qty 1

## 2013-10-26 MED ORDER — WARFARIN SODIUM 2.5 MG PO TABS: 2.5000 mg | ORAL_TABLET | Freq: Every day | ORAL | Status: DC

## 2013-10-26 MED ORDER — HYDROMORPHONE HCL PF 1 MG/ML IJ SOLN: 0.5000 mg | INTRAMUSCULAR | Status: DC | PRN

## 2013-10-26 MED ORDER — PROPOFOL 10 MG/ML IV BOLUS
INTRAVENOUS | Status: AC
  Filled 2013-10-26: qty 20

## 2013-10-26 MED ORDER — METOCLOPRAMIDE HCL 10 MG PO TABS: 5.0000 mg | ORAL_TABLET | Freq: Three times a day (TID) | ORAL | Status: DC | PRN

## 2013-10-26 MED ORDER — ATORVASTATIN CALCIUM 20 MG PO TABS
20.0000 mg | ORAL_TABLET | Freq: Every day | ORAL | Status: DC
  Administered 2013-10-27 – 2013-10-29 (×3): 20 mg via ORAL
  Filled 2013-10-26 (×3): qty 1

## 2013-10-26 MED ORDER — ONDANSETRON HCL 4 MG/2ML IJ SOLN: 4.0000 mg | Freq: Four times a day (QID) | INTRAMUSCULAR | Status: DC | PRN

## 2013-10-26 MED ORDER — WARFARIN SODIUM 5 MG PO TABS
5.0000 mg | ORAL_TABLET | Freq: Once | ORAL | Status: AC
  Administered 2013-10-26: 5 mg via ORAL
  Filled 2013-10-26: qty 1

## 2013-10-26 MED ORDER — 0.9 % SODIUM CHLORIDE (POUR BTL) OPTIME
TOPICAL | Status: DC | PRN
  Administered 2013-10-26: 1000 mL

## 2013-10-26 MED ORDER — LIDOCAINE HCL (CARDIAC) 20 MG/ML IV SOLN
INTRAVENOUS | Status: DC | PRN
  Administered 2013-10-26: 70 mg via INTRAVENOUS

## 2013-10-26 MED ORDER — NYSTATIN 100000 UNIT/ML MT SUSP: 5.0000 mL | Freq: Four times a day (QID) | OROMUCOSAL | Status: DC

## 2013-10-26 MED ORDER — SODIUM CHLORIDE 0.9 % IV SOLN: INTRAVENOUS | Status: DC

## 2013-10-26 MED ORDER — FENTANYL CITRATE 0.05 MG/ML IJ SOLN
INTRAMUSCULAR | Status: DC | PRN
  Administered 2013-10-26: 50 ug via INTRAVENOUS

## 2013-10-26 MED ORDER — ASPIRIN 81 MG PO CHEW
81.0000 mg | CHEWABLE_TABLET | Freq: Every day | ORAL | Status: DC
  Administered 2013-10-27 – 2013-10-29 (×3): 81 mg via ORAL
  Filled 2013-10-26 (×3): qty 1

## 2013-10-26 SURGICAL SUPPLY — 52 items
BANDAGE ESMARK 6X9 LF (GAUZE/BANDAGES/DRESSINGS) ×1 IMPLANT
BANDAGE GAUZE ELAST BULKY 4 IN (GAUZE/BANDAGES/DRESSINGS) ×4 IMPLANT
BLADE SAW RECIP 87.9 MT (BLADE) ×1 IMPLANT
BLADE SURG 21 STRL SS (BLADE) ×3 IMPLANT
BNDG CMPR 9X6 STRL LF SNTH (GAUZE/BANDAGES/DRESSINGS)
BNDG COHESIVE 6X5 TAN STRL LF (GAUZE/BANDAGES/DRESSINGS) ×4 IMPLANT
BNDG ESMARK 6X9 LF (GAUZE/BANDAGES/DRESSINGS)
CLOTH BEACON ORANGE TIMEOUT ST (SAFETY) ×1 IMPLANT
COVER SURGICAL LIGHT HANDLE (MISCELLANEOUS) ×3 IMPLANT
CUFF TOURNIQUET SINGLE 34IN LL (TOURNIQUET CUFF) ×2 IMPLANT
CUFF TOURNIQUET SINGLE 44IN (TOURNIQUET CUFF) IMPLANT
DRAIN PENROSE 1/2X12 LTX STRL (WOUND CARE) IMPLANT
DRAPE EXTREMITY T 121X128X90 (DRAPE) ×3 IMPLANT
DRAPE PROXIMA HALF (DRAPES) ×4 IMPLANT
DRAPE U-SHAPE 47X51 STRL (DRAPES) ×4 IMPLANT
DRSG ADAPTIC 3X8 NADH LF (GAUZE/BANDAGES/DRESSINGS) ×3 IMPLANT
DRSG PAD ABDOMINAL 8X10 ST (GAUZE/BANDAGES/DRESSINGS) ×3 IMPLANT
DURAPREP 26ML APPLICATOR (WOUND CARE) ×3 IMPLANT
ELECT REM PT RETURN 9FT ADLT (ELECTROSURGICAL) ×3
ELECTRODE REM PT RTRN 9FT ADLT (ELECTROSURGICAL) ×1 IMPLANT
GLOVE BIOGEL PI IND STRL 7.0 (GLOVE) IMPLANT
GLOVE BIOGEL PI IND STRL 7.5 (GLOVE) IMPLANT
GLOVE BIOGEL PI IND STRL 9 (GLOVE) ×1 IMPLANT
GLOVE BIOGEL PI INDICATOR 7.0 (GLOVE) ×2
GLOVE BIOGEL PI INDICATOR 7.5 (GLOVE) ×2
GLOVE BIOGEL PI INDICATOR 9 (GLOVE) ×2
GLOVE ECLIPSE 6.5 STRL STRAW (GLOVE) ×2 IMPLANT
GLOVE SURG ORTHO 9.0 STRL STRW (GLOVE) ×3 IMPLANT
GLOVE SURG SS PI 6.5 STRL IVOR (GLOVE) ×2 IMPLANT
GOWN PREVENTION PLUS XLARGE (GOWN DISPOSABLE) ×1 IMPLANT
GOWN SRG XL XLNG 56XLVL 4 (GOWN DISPOSABLE) ×1 IMPLANT
GOWN STRL NON-REIN XL XLG LVL4 (GOWN DISPOSABLE)
GOWN STRL REUS W/TWL MED LVL3 (GOWN DISPOSABLE) ×2 IMPLANT
GOWN STRL REUS W/TWL XL LVL3 (GOWN DISPOSABLE) ×4 IMPLANT
KIT BASIN OR (CUSTOM PROCEDURE TRAY) ×3 IMPLANT
KIT ROOM TURNOVER OR (KITS) ×3 IMPLANT
MANIFOLD NEPTUNE II (INSTRUMENTS) ×1 IMPLANT
NS IRRIG 1000ML POUR BTL (IV SOLUTION) ×3 IMPLANT
PACK GENERAL/GYN (CUSTOM PROCEDURE TRAY) ×3 IMPLANT
PAD ARMBOARD 7.5X6 YLW CONV (MISCELLANEOUS) ×6 IMPLANT
SPONGE GAUZE 4X4 12PLY (GAUZE/BANDAGES/DRESSINGS) ×3 IMPLANT
SPONGE LAP 18X18 X RAY DECT (DISPOSABLE) IMPLANT
STAPLER VISISTAT 35W (STAPLE) ×4 IMPLANT
STOCKINETTE IMPERVIOUS LG (DRAPES) ×3 IMPLANT
SUT PDS AB 1 CT  36 (SUTURE) ×4
SUT PDS AB 1 CT 36 (SUTURE) IMPLANT
SUT SILK 2 0 (SUTURE) ×3
SUT SILK 2-0 18XBRD TIE 12 (SUTURE) ×1 IMPLANT
TOWEL OR 17X24 6PK STRL BLUE (TOWEL DISPOSABLE) ×3 IMPLANT
TOWEL OR 17X26 10 PK STRL BLUE (TOWEL DISPOSABLE) ×3 IMPLANT
TUBE ANAEROBIC SPECIMEN COL (MISCELLANEOUS) IMPLANT
WATER STERILE IRR 1000ML POUR (IV SOLUTION) ×3 IMPLANT

## 2013-10-26 NOTE — Transfer of Care (Signed)
Immediate Anesthesia Transfer of Care Note  Patient: Ann [name]  Procedure(s) Performed: Procedure(s) with comments: AMPUTATION BELOW KNEE (Right) - Right Below Knee Amputation  Patient Location: PACU  Anesthesia Type:General  Level of Consciousness: awake, alert  and patient cooperative  Airway & Oxygen Therapy: Patient Spontanous Breathing  Post-op Assessment: Report given to PACU RN and Post -op Vital signs reviewed and stable  Post vital signs: Reviewed and stable  Complications: No apparent anesthesia complications

## 2013-10-26 NOTE — Anesthesia Preprocedure Evaluation (Addendum)
Anesthesia Evaluation  Patient identified by MRN, date of birth, ID band Patient awake    Reviewed: Allergy & Precautions, H&P , NPO status , Patient's Chart, lab work & pertinent test results  Airway Mallampati: II      Dental   Pulmonary shortness of breath and with exertion,          Cardiovascular hypertension, + Peripheral Vascular Disease     Neuro/Psych    GI/Hepatic Neg liver ROS, GERD-  ,  Endo/Other  negative endocrine ROS  Renal/GU negative Renal ROS     Musculoskeletal   Abdominal   Peds  Hematology   Anesthesia Other Findings   Reproductive/Obstetrics                          Anesthesia Physical Anesthesia Plan  ASA: III  Anesthesia Plan: General   Post-op Pain Management:    Induction: Intravenous  Airway Management Planned: Oral ETT  Additional Equipment:   Intra-op Plan:   Post-operative Plan: Extubation in OR  Informed Consent:   Plan Discussed with: CRNA and Anesthesiologist  Anesthesia Plan Comments:         Anesthesia Quick Evaluation

## 2013-10-26 NOTE — Anesthesia Procedure Notes (Signed)
Procedure Name: LMA Insertion Date/Time: 10/26/2013 3:00 PM Performed by: Arlice Colt B Pre-anesthesia Checklist: Patient identified, Emergency Drugs available, Suction available, Patient being monitored and Timeout performed Patient Re-evaluated:Patient Re-evaluated prior to inductionOxygen Delivery Method: Circle system utilized Preoxygenation: Pre-oxygenation with 100% oxygen Intubation Type: IV induction LMA: LMA inserted LMA Size: 4.0 Number of attempts: 1 Placement Confirmation: positive ETCO2 and breath sounds checked- equal and bilateral Tube secured with: Tape Dental Injury: Teeth and Oropharynx as per pre-operative assessment

## 2013-10-26 NOTE — Op Note (Signed)
OPERATIVE REPORT  DATE OF SURGERY: 10/26/2013  PATIENT:  Ann [name],  80 y.o. female  PRE-OPERATIVE DIAGNOSIS:  Gangrene Right  Foot  POST-OPERATIVE DIAGNOSIS:  Gangrene Right  Foot  PROCEDURE:  Procedure(s): AMPUTATION BELOW KNEE  SURGEON:  Surgeon(s): Nadara Mustard, MD  ANESTHESIA:   general  EBL:  min ML  SPECIMEN:  Source of Specimen:  Right leg  TOURNIQUET:   Total Tourniquet Time Documented: Thigh (Right) - 6 minutes Total: Thigh (Right) - 6 minutes   PROCEDURE DETAILS: Patient is an 80 year old woman with gangrene of the right foot who presents at this time for transtibial amputation. Risks and benefits were discussed with the patient and her family including infection neurovascular injury nonhealing of the wound need for higher level amputation. Patient family state they understand and wish to proceed at this time. Description of procedure patient was brought to the operating room and underwent a general anesthetic. After adequate levels of anesthesia were obtained patient's right lower extremity was prepped using DuraPrep draped into a sterile field and the foot was draped out of the sterile field with an impervious stockinette. A transverse incision was made 11 cm distal to the tibial tubercle this curved proximally and a large posterior flap was created. The tibia was transected just proximal to the skin incision and the fibula was transected just proximal to the tibial incision. A large posterior flap was created. The sciatic nerve was pulled cut and allowed to retract. The vascular bundles were suture ligated with 2-0 silk. The tourniquet was deflated hemostasis was obtained. The deep and superficial fascial layers were closed using #1 PDS the skin was closed using staples. The wound was covered with Adaptic orthopedic sponges AB dressing Kerlix and Coban. Patient was extubated taken to the PACU in stable condition.  PLAN OF CARE: Admit to inpatient   PATIENT  DISPOSITION:  PACU - hemodynamically stable.   Nadara Mustard, MD 10/26/2013 7:39 PM

## 2013-10-26 NOTE — Anesthesia Postprocedure Evaluation (Signed)
  Anesthesia Post-op Note  Patient: Ann [name]  Procedure(s) Performed: Procedure(s) with comments: AMPUTATION BELOW KNEE (Right) - Right Below Knee Amputation  Patient Location: PACU  Anesthesia Type:General  Level of Consciousness: awake  Airway and Oxygen Therapy: Patient Spontanous Breathing  Post-op Pain: mild  Post-op Assessment: Post-op Vital signs reviewed  Post-op Vital Signs: Reviewed  Complications: No apparent anesthesia complications

## 2013-10-26 NOTE — Progress Notes (Signed)
Transported by l [employer]

## 2013-10-26 NOTE — H&P (Signed)
Ann [name] is an 80 y.o. female.   Chief Complaint: Abscess ulceration osteomyelitis right foot HPI: Patient is an 80 year old woman with critical lower limb ischemia with failure of foot salvage with wound care for the right foot  Past Medical History  Diagnosis Date  . Hypertension   . Dementia   . Hyperlipidemia   . Seizures   . Metatarsal bone fracture     Right - base of 5th  . DVT of lower extremity (deep venous thrombosis) 02/2013    Left lower extremity, subacute  . DVT (deep venous thrombosis)     Past Surgical History  Procedure Laterality Date  . Toe surgery      right 5th toe amputation    Family History  Problem Relation Age of Onset  . Hypertension Mother   . Arthritis Father   . Heart disease Daughter     Heart Disease before age 4  . Hyperlipidemia Daughter   . Hypertension Daughter    Social History:  reports that she has never smoked. She has never used smokeless tobacco. She reports that she does not drink alcohol or use illicit drugs.  Allergies: No Known Allergies  No prescriptions prior to admission    No results found for this or any previous visit (from the past 48 hour(s)). No results found.  Review of Systems  All other systems reviewed and are negative.    There were no vitals taken for this visit. Physical Exam  On examination patient has gangrenous changes to the right foot with abscess ulceration and osteomyelitis. Assessment/Plan Assessment: Abscess ulcer ulceration osteomyelitis with gangrene right foot.  Plan: We'll plan for a right transtibial amputation. Risks and benefits were discussed including infection neurovascular injury nonhealing of the wound need for additional surgery. Patient states she understands and wished to proceed at this time.  Ann [name] V 10/26/2013, 8:00 AM

## 2013-10-26 NOTE — Progress Notes (Addendum)
ANTICOAGULATION CONSULT NOTE - Initial Consult  Pharmacy Consult for coumadin Indication: VTE prophylaxis  No Known Allergies  Patient Measurements: Height: 5' 1.02" (155 cm) Weight: 147 lb 0.8 oz (66.7 kg) IBW/kg (Calculated) : 47.86  Vital Signs: Temp: 97.7 F (36.5 C) (02/20 1620) Temp src: Oral (02/20 1123) BP: 97/67 mmHg (02/20 1620) Pulse Rate: 78 (02/20 1600)  Labs:  Recent Labs  10/26/13 1126  HGB 11.1*  HCT 33.7*  PLT 288  APTT 31  LABPROT 15.4*  INR 1.25  CREATININE 1.00    Estimated Creatinine Clearance: 37.3 ml/min (by C-G formula based on Cr of 1).   Medical History: Past Medical History  Diagnosis Date  . Hypertension   . Dementia   . Hyperlipidemia   . Seizures   . Metatarsal bone fracture     Right - base of 5th  . DVT of lower extremity (deep venous thrombosis) 02/2013    Left lower extremity, subacute  . DVT (deep venous thrombosis)     Medications:  Prescriptions prior to admission  Medication Sig Dispense Refill  . amLODipine (NORVASC) 10 MG tablet Take 10 mg by mouth daily.      Marland Kitchen aspirin 81 MG chewable tablet Chew 81 mg by mouth daily.      Marland Kitchen atorvastatin (LIPITOR) 20 MG tablet Take 1 tablet (20 mg total) by mouth daily.  30 tablet  5  . carbamazepine (CARBATROL) 300 MG 12 hr capsule Take 300 mg by mouth 2 (two) times daily.      . hydrochlorothiazide (HYDRODIURIL) 25 MG tablet Take 25 mg by mouth daily.      . metoprolol succinate (TOPROL-XL) 50 MG 24 hr tablet Take 50 mg by mouth 2 (two) times daily. Take with or immediately following a meal.      . memantine (NAMENDA) 10 MG tablet Take 1 tablet (10 mg total) by mouth 2 (two) times daily.  180 tablet  1  . nystatin (MYCOSTATIN) 100000 UNIT/ML suspension Take 5 mLs (500,000 Units total) by mouth 4 (four) times daily. Swish in mouth for at least a minute before swallowing. Use x 5-7 days  120 mL  0  . traMADol (ULTRAM) 50 MG tablet Take 1 tablet (50 mg total) by mouth every 6 (six)  hours as needed.  50 tablet  0  . warfarin (COUMADIN) 2.5 MG tablet Take 2.5-5 mg by mouth daily. Take 2 tablets (5 mg) on Sunday, Monday, Wednesday, Thursday,and Friday.  Take 1 tablet on Tuesday and Saturday.       Scheduled:  . [START ON 10/27/2013] amLODipine  10 mg Oral Daily  . [START ON 10/27/2013] aspirin  81 mg Oral Daily  . [START ON 10/27/2013] atorvastatin  20 mg Oral Daily  . [START ON 10/27/2013] carbamazepine  300 mg Oral BID  .  ceFAZolin (ANCEF) IV  1 g Intravenous Q6H  . hydrochlorothiazide  25 mg Oral Daily  . metoprolol succinate  50 mg Oral BID  . oxyCODONE-acetaminophen        Assessment: 80 yo female s/p R AKA and to start coumadin for VTE prophylaxis. Patient has a history of DVT (02/2013) and recently stopped coumadin 09/27/13 (INR at that time was 5.4). INR today is 1.25. Spoke to Dr. Lajoyce Corners and he has asked pharmacy to continue coumadin for VTE prophylaxis  Last coumadin dose per patient was 5mg  SuMWTF and 2.5mg  on TSu  Goal of Therapy:  INR 2-3 Monitor platelets by anticoagulation protocol: Yes   Plan:   -  Coumadin 5mg  po today and will consider a reduced dose in 1-2 days -Daily PT/INR  Harland GermanAndrew Brittany Osier, Pharm D 10/26/2013 5:27 PM

## 2013-10-27 LAB — PROTIME-INR
INR: 1.45 (ref 0.00–1.49)
PROTHROMBIN TIME: 17.3 s — AB (ref 11.6–15.2)

## 2013-10-27 MED ORDER — PNEUMOCOCCAL VAC POLYVALENT 25 MCG/0.5ML IJ INJ
0.5000 mL | INJECTION | INTRAMUSCULAR | Status: AC
  Administered 2013-10-29: 0.5 mL via INTRAMUSCULAR
  Filled 2013-10-27: qty 0.5

## 2013-10-27 MED ORDER — WARFARIN SODIUM 3 MG PO TABS
3.0000 mg | ORAL_TABLET | Freq: Once | ORAL | Status: AC
  Administered 2013-10-27: 3 mg via ORAL
  Filled 2013-10-27: qty 1

## 2013-10-27 NOTE — Evaluation (Signed)
Physical Therapy Evaluation Patient Details Name: Ann [name] MRN: [id number] DOB: 1927/09/26 Today's Date: 10/27/2013 Time: 3299-2426 PT Time Calculation (min): 27 min  PT Assessment / Plan / Recommendation History of Present Illness  Pt presented with osteomyelitis R foot requiring BKA 10-26-13.  Clinical Impression  Pt admitted with osteomyelitis R foot s/p R BKA. Pt currently with functional limitations due to the deficits listed below (see PT Problem List). Difficult to obtain PLOF due to pt being poor historian.  Pt will benefit from skilled PT to increase their independence and safety with mobility to allow discharge to the venue listed below. Recommending SNF at d/c.      PT Assessment  Patient needs continued PT services    Follow Up Recommendations  SNF;Supervision/Assistance - 24 hour    Does the patient have the potential to tolerate intense rehabilitation      Barriers to Discharge        Equipment Recommendations  Wheelchair (measurements PT)    Recommendations for Other Services     Frequency Min 2X/week    Precautions / Restrictions Precautions Precautions: Fall Restrictions Weight Bearing Restrictions: Yes RLE Weight Bearing: Non weight bearing   Pertinent Vitals/Pain       Mobility  Bed Mobility Overal bed mobility: Needs Assistance;+2 for physical assistance Bed Mobility: Supine to Sit Supine to sit: Mod assist;+2 for physical assistance Transfers Overall transfer level: Needs assistance Equipment used: Rolling walker (2 wheeled) Transfers: Sit to/from Stand Sit to Stand: Mod assist;+2 physical assistance General transfer comment: with both hands pulling up on RW    Exercises     PT Diagnosis: Difficulty walking;Generalized weakness;Acute pain  PT Problem List: Decreased activity tolerance;Decreased balance;Decreased mobility;Decreased safety awareness;Decreased knowledge of use of DME;Decreased knowledge of precautions;Pain;Decreased  cognition PT Treatment Interventions:       PT Goals(Current goals can be found in the care plan section) Acute Rehab PT Goals Patient Stated Goal: unable to state PT Goal Formulation: Patient unable to participate in goal setting Time For Goal Achievement: 11/10/13 Potential to Achieve Goals: Fair  Visit Information  Last PT Received On: 10/27/13 Assistance Needed: +2 PT/OT/SLP Co-Evaluation/Treatment: Yes Reason for Co-Treatment: For patient/therapist safety PT goals addressed during session: Mobility/safety with mobility;Balance;Proper use of DME OT goals addressed during session: ADL's and self-care History of Present Illness: Pt presented with osteomyelitis R foot requiring BKA 10-26-13.       Prior Functioning  Home Living Family/patient expects to be discharged to:: Private residence Living Arrangements: Children;Other relatives Additional Comments: Pt is poor historian.  Unsure of prior home set up. Prior Function Comments: Pt poor historian.  Unsure of PLOF.  Pt reports she was ambulatory with quad cane, but unsure of accuracy. Communication Communication: No difficulties    Cognition  Cognition Arousal/Alertness: Awake/alert Behavior During Therapy: Anxious Overall Cognitive Status: No family/caregiver present to determine baseline cognitive functioning Memory: Decreased short-term memory    Extremity/Trunk Assessment Upper Extremity Assessment Upper Extremity Assessment: Overall WFL for tasks assessed Lower Extremity Assessment Lower Extremity Assessment: RLE deficits/detail RLE Deficits / Details: limited ROM R knee due to pain/dressing RLE: Unable to fully assess due to pain   Balance Balance Overall balance assessment: Needs assistance Sitting-balance support: Bilateral upper extremity supported;Feet supported Sitting balance-Leahy Scale: Fair Postural control: Posterior lean Standing balance support: Bilateral upper extremity supported Standing  balance-Leahy Scale: Poor Standing balance comment: static stand with RW at bedside.  Pt retropulsive.  End of Session PT - End of Session Equipment Utilized  During Treatment: Gait belt Activity Tolerance: Patient tolerated treatment well Patient left: in bed;with call bell/phone within reach;with bed alarm set Nurse Communication: Mobility status  GP     Ilda FoilGarrow, Orel Hord Rene 10/27/2013, 12:45 PM  Aida RaiderWendy Salote Weidmann, PT  Office # [phone] Pager [phone]

## 2013-10-27 NOTE — Progress Notes (Addendum)
ANTICOAGULATION CONSULT NOTE - Initial Consult  Pharmacy Consult for coumadin Indication: VTE prophylaxis  No Known Allergies  Patient Measurements: Height: 5' 1.02" (155 cm) Weight: 147 lb 0.8 oz (66.7 kg) IBW/kg (Calculated) : 47.86  Vital Signs: Temp: 98.1 F (36.7 C) (02/21 0525) Temp src: Oral (02/21 0525) BP: 114/88 mmHg (02/21 1009) Pulse Rate: 103 (02/21 1009)  Labs:  Recent Labs  10/26/13 1126 10/27/13 0442  HGB 11.1*  --   HCT 33.7*  --   PLT 288  --   APTT 31  --   LABPROT 15.4* 17.3*  INR 1.25 1.45  CREATININE 1.00  --     Estimated Creatinine Clearance: 37.3 ml/min (by C-G formula based on Cr of 1).   Medical History: Past Medical History  Diagnosis Date  . Hypertension   . Dementia   . Hyperlipidemia   . Seizures   . Metatarsal bone fracture     Right - base of 5th  . DVT of lower extremity (deep venous thrombosis) 02/2013    Left lower extremity, subacute  . DVT (deep venous thrombosis)     Medications:  Prescriptions prior to admission  Medication Sig Dispense Refill  . amLODipine (NORVASC) 10 MG tablet Take 10 mg by mouth daily.      Marland Kitchen aspirin 81 MG chewable tablet Chew 81 mg by mouth daily.      Marland Kitchen atorvastatin (LIPITOR) 20 MG tablet Take 1 tablet (20 mg total) by mouth daily.  30 tablet  5  . carbamazepine (CARBATROL) 300 MG 12 hr capsule Take 300 mg by mouth 2 (two) times daily.      . hydrochlorothiazide (HYDRODIURIL) 25 MG tablet Take 25 mg by mouth daily.      . memantine (NAMENDA) 10 MG tablet Take 1 tablet (10 mg total) by mouth 2 (two) times daily.  180 tablet  1  . metoprolol succinate (TOPROL-XL) 50 MG 24 hr tablet Take 50 mg by mouth 2 (two) times daily. Take with or immediately following a meal.      . nystatin (MYCOSTATIN) 100000 UNIT/ML suspension Take 5 mLs (500,000 Units total) by mouth 4 (four) times daily. Swish in mouth for at least a minute before swallowing. Use x 5-7 days  120 mL  0  . traMADol (ULTRAM) 50 MG tablet  Take 1 tablet (50 mg total) by mouth every 6 (six) hours as needed.  50 tablet  0   Scheduled:  . amLODipine  10 mg Oral Daily  . aspirin  81 mg Oral Daily  . atorvastatin  20 mg Oral Daily  . carbamazepine  300 mg Oral BID  . hydrochlorothiazide  25 mg Oral Daily  . metoprolol succinate  50 mg Oral BID  . Warfarin - Pharmacist Dosing Inpatient   Does not apply q1800    Assessment: 80 yo female s/p R AKA and to start coumadin for VTE prophylaxis. Patient has a history of DVT (02/2013) and recently stopped coumadin 09/27/13 (INR at that time was 5.4). INR today is 1.45.   Last coumadin dose per patient was 5mg  SuMWTF and 2.5mg  on TSu  Goal of Therapy:  INR 2-3 Monitor platelets by anticoagulation protocol: Yes   Plan:    Coumadin 3mg  PO x1 Daily INR

## 2013-10-27 NOTE — Progress Notes (Signed)
Occupational Therapy Evaluation Patient Details Name: Ann [name] MRN: [id number] DOB: 1928/08/03 Today's Date: 10/27/2013 Time: 3419-6222 OT Time Calculation (min): 29 min  OT Assessment / Plan / Recommendation History of present illness Pt presented with osteomyelitis R foot requiring BKA 10-26-13.   Clinical Impression   Pt admitted with above. Will continue to follow acutely in order to address below problem list. No family available during session to determine pt's PLOF (pt is poor historian and confused during session). Recommending SNF at d/c.    OT Assessment  Patient needs continued OT Services    Follow Up Recommendations  SNF;Supervision/Assistance - 24 hour    Barriers to Discharge      Equipment Recommendations   (TBD next venue)    Recommendations for Other Services    Frequency  Min 2X/week    Precautions / Restrictions Precautions Precautions: Fall Restrictions Weight Bearing Restrictions: Yes RLE Weight Bearing: Non weight bearing   Pertinent Vitals/Pain See vitals    ADL  Upper Body Dressing: Performed;+1 Total assistance Where Assessed - Upper Body Dressing: Supine, head of bed up Toilet Transfer: +2 Total assistance;Moderate assistance;Simulated Toilet Transfer Method: Sit to Barista:  (bed) Toileting - Clothing Manipulation and Hygiene: Performed;+1 Total assistance Where Assessed - Toileting Clothing Manipulation and Hygiene: Supine, head of bed flat;Rolling right and/or left Equipment Used: Gait belt;Rolling walker Transfers/Ambulation Related to ADLs: +2 assist for sit<>stand x2 trials ADL Comments: Pt with urinary incontinence during sit<>stand.  Assisted pt with return to bed and with changing into clean gown.  Pt very confused and requires increased time for one step commands.    OT Diagnosis: Generalized weakness;Acute pain;Cognitive deficits  OT Problem List: Decreased strength;Decreased activity tolerance;Impaired  balance (sitting and/or standing);Decreased safety awareness;Decreased knowledge of use of DME or AE;Pain;Decreased cognition OT Treatment Interventions: Self-care/ADL training;DME and/or AE instruction;Therapeutic activities;Patient/family education;Balance training;Cognitive remediation/compensation   OT Goals(Current goals can be found in the care plan section) Acute Rehab OT Goals Patient Stated Goal: unable to state OT Goal Formulation: Patient unable to participate in goal setting Time For Goal Achievement: 11/10/13 Potential to Achieve Goals: Good  Visit Information  Last OT Received On: 10/27/13 Assistance Needed: +2 PT/OT/SLP Co-Evaluation/Treatment: Yes Reason for Co-Treatment: For patient/therapist safety PT goals addressed during session: Mobility/safety with mobility;Balance;Proper use of DME OT goals addressed during session: ADL's and self-care History of Present Illness: Pt presented with osteomyelitis R foot requiring BKA 10-26-13.       Prior Functioning     Home Living Family/patient expects to be discharged to:: Private residence Living Arrangements: Children;Other relatives Additional Comments: Pt is poor historian.  Unsure of prior home set up. Prior Function Comments: Pt poor historian.  Unsure of PLOF.  Pt reports she was ambulatory with quad cane, but unsure of accuracy. Communication Communication: No difficulties         Vision/Perception     Cognition  Cognition Arousal/Alertness: Awake/alert Behavior During Therapy: Anxious Overall Cognitive Status: No family/caregiver present to determine baseline cognitive functioning Memory: Decreased short-term memory    Extremity/Trunk Assessment Upper Extremity Assessment Upper Extremity Assessment: Overall WFL for tasks assessed Lower Extremity Assessment Lower Extremity Assessment: RLE deficits/detail RLE Deficits / Details: limited ROM R knee due to pain/dressing RLE: Unable to fully assess due  to pain     Mobility Bed Mobility Overal bed mobility: Needs Assistance;+2 for physical assistance Bed Mobility: Supine to Sit Supine to sit: Mod assist;+2 for physical assistance Transfers Overall transfer level: Needs assistance  Equipment used: Rolling walker (2 wheeled) Transfers: Sit to/from Stand Sit to Stand: Mod assist;+2 physical assistance General transfer comment: with both hands pulling up on RW     Exercise     Balance Balance Overall balance assessment: Needs assistance Sitting-balance support: Bilateral upper extremity supported Sitting balance-Leahy Scale: Fair Postural control: Posterior lean Standing balance support: Bilateral upper extremity supported Standing balance-Leahy Scale: Poor Standing balance comment: static stand with RW at bedside.  Pt retropulsive.   End of Session OT - End of Session Equipment Utilized During Treatment: Gait belt;Rolling walker Activity Tolerance: Patient tolerated treatment well Patient left: in bed;with call bell/phone within reach;with nursing/sitter in room;with bed alarm set Nurse Communication: Mobility status  GO    10/27/2013 Cipriano MileJohnson, Xiana Carns Elizabeth OTR/L Pager [phone] Office [phone]  Cipriano MileJohnson, Tierre Netto Elizabeth 10/27/2013, 12:52 PM

## 2013-10-28 LAB — PROTIME-INR
INR: 1.36 (ref 0.00–1.49)
Prothrombin Time: 16.4 seconds — ABNORMAL HIGH (ref 11.6–15.2)

## 2013-10-28 MED ORDER — ACETAMINOPHEN 325 MG PO TABS
650.0000 mg | ORAL_TABLET | Freq: Four times a day (QID) | ORAL | Status: DC | PRN
  Administered 2013-10-28: 325 mg via ORAL
  Administered 2013-10-28: 650 mg via ORAL
  Filled 2013-10-28 (×2): qty 2

## 2013-10-28 MED ORDER — WARFARIN SODIUM 5 MG PO TABS
5.0000 mg | ORAL_TABLET | Freq: Once | ORAL | Status: AC
  Administered 2013-10-28: 5 mg via ORAL
  Filled 2013-10-28: qty 1

## 2013-10-28 MED ORDER — MEMANTINE HCL 10 MG PO TABS
10.0000 mg | ORAL_TABLET | Freq: Two times a day (BID) | ORAL | Status: DC
  Administered 2013-10-28 – 2013-10-29 (×3): 10 mg via ORAL
  Filled 2013-10-28 (×4): qty 1

## 2013-10-28 NOTE — Progress Notes (Signed)
Clinical Social Work Department BRIEF PSYCHOSOCIAL ASSESSMENT 10/28/2013  Patient:  KAYLY, SCHOLES     Account Number:  [id number]     Admit date:  10/26/2013  Clinical Social Worker:  Hendricks Milo  Date/Time:  10/28/2013 03:42 PM  Referred by:  Physician  Date Referred:  10/26/2013 Referred for  SNF Placement   Other Referral:   Interview type:  Family Other interview type:    PSYCHOSOCIAL DATA Living Status:  WITH ADULT CHILDREN Admitted from facility:   Level of care:   Primary support name:  Akili Dicken [phone]) Primary support relationship to patient:  CHILD, ADULT Degree of support available:   Good support.    CURRENT CONCERNS  Other Concerns:    SOCIAL WORK ASSESSMENT / PLAN Clinical Social Worker (CSW) contacted patient's daughter via phone because per chart, patient was not alert and oriented. CSW spoke with daughter about D/C plan. Patient's daughter reported patient resides with her. Patient's daughter listed [facility] and  [facility] as preferences.   Assessment/plan status:  Psychosocial Support/Ongoing Assessment of Needs Other assessment/ plan:   Information/referral to community resources:    PATIENT'S/FAMILY'S RESPONSE TO PLAN OF CARE: Patient's daughter thanked CSW. Daughter appeared relieved with D/C plan.

## 2013-10-28 NOTE — Progress Notes (Signed)
ANTICOAGULATION CONSULT NOTE - Initial Consult  Pharmacy Consult for coumadin Indication: VTE prophylaxis  No Known Allergies  Patient Measurements: Height: 5' 1.02" (155 cm) Weight: 147 lb 0.8 oz (66.7 kg) IBW/kg (Calculated) : 47.86  Vital Signs: Temp: 98.8 F (37.1 C) (02/22 0557) Temp src: Oral (02/22 0557) BP: 154/93 mmHg (02/22 0938) Pulse Rate: 80 (02/22 0938)  Labs:  Recent Labs  10/26/13 1126 10/27/13 0442 10/28/13 0500  HGB 11.1*  --   --   HCT 33.7*  --   --   PLT 288  --   --   APTT 31  --   --   LABPROT 15.4* 17.3* 16.4*  INR 1.25 1.45 1.36  CREATININE 1.00  --   --     Estimated Creatinine Clearance: 37.3 ml/min (by C-G formula based on Cr of 1).   Medical History: Past Medical History  Diagnosis Date  . Hypertension   . Dementia   . Hyperlipidemia   . Seizures   . Metatarsal bone fracture     Right - base of 5th  . DVT of lower extremity (deep venous thrombosis) 02/2013    Left lower extremity, subacute  . DVT (deep venous thrombosis)     Medications:  Prescriptions prior to admission  Medication Sig Dispense Refill  . amLODipine (NORVASC) 10 MG tablet Take 10 mg by mouth daily.      Marland Kitchen aspirin 81 MG chewable tablet Chew 81 mg by mouth daily.      Marland Kitchen atorvastatin (LIPITOR) 20 MG tablet Take 1 tablet (20 mg total) by mouth daily.  30 tablet  5  . carbamazepine (CARBATROL) 300 MG 12 hr capsule Take 300 mg by mouth 2 (two) times daily.      . hydrochlorothiazide (HYDRODIURIL) 25 MG tablet Take 25 mg by mouth daily.      . memantine (NAMENDA) 10 MG tablet Take 1 tablet (10 mg total) by mouth 2 (two) times daily.  180 tablet  1  . metoprolol succinate (TOPROL-XL) 50 MG 24 hr tablet Take 50 mg by mouth 2 (two) times daily. Take with or immediately following a meal.      . nystatin (MYCOSTATIN) 100000 UNIT/ML suspension Take 5 mLs (500,000 Units total) by mouth 4 (four) times daily. Swish in mouth for at least a minute before swallowing. Use x 5-7  days  120 mL  0  . traMADol (ULTRAM) 50 MG tablet Take 1 tablet (50 mg total) by mouth every 6 (six) hours as needed.  50 tablet  0   Scheduled:  . amLODipine  10 mg Oral Daily  . aspirin  81 mg Oral Daily  . atorvastatin  20 mg Oral Daily  . carbamazepine  300 mg Oral BID  . hydrochlorothiazide  25 mg Oral Daily  . memantine  10 mg Oral BID  . metoprolol succinate  50 mg Oral BID  . pneumococcal 23 valent vaccine  0.5 mL Intramuscular Tomorrow-1000  . Warfarin - Pharmacist Dosing Inpatient   Does not apply q1800    Assessment: 80 yo female s/p R AKA and to start coumadin for VTE prophylaxis. Patient has a history of DVT (02/2013) and recently stopped coumadin 09/27/13 (INR at that time was 5.4). INR today is 1.36   Last coumadin dose per patient was 5mg  SuMWTF and 2.5mg  on TSu  Goal of Therapy:  INR 2-3 Monitor platelets by anticoagulation protocol: Yes   Plan:    Coumadin 5mg  PO x1 Daily INR

## 2013-10-28 NOTE — Progress Notes (Addendum)
Clinical Social Work Department CLINICAL SOCIAL WORK PLACEMENT NOTE 10/28/2013  Patient:  Ann [name], Ann [name]  Account Number:  [id number] Admit date:  10/26/2013  Clinical Social Worker:  Jetta Lout, Theresia Majors  Date/time:  10/28/2013 04:03 PM  Clinical Social Work is seeking post-discharge placement for this patient at the following level of care:   SKILLED NURSING   (*CSW will update this form in Epic as items are completed)   10/28/2013  Patient/family provided with [facility] Department of Clinical Social Work's list of facilities offering this level of care within the geographic area requested by the patient (or if unable, by the patient's family).  10/28/2013  Patient/family informed of their freedom to choose among providers that offer the needed level of care, that participate in Medicare, Medicaid or managed care program needed by the patient, have an available bed and are willing to accept the patient.  10/28/2013  Patient/family informed of [facility]' ownership interest in [facility], as well as of the fact that they are under no obligation to receive care at this facility.  PASARR submitted to EDS on 10/28/2013 PASARR number received from [employer] on 10/28/2013  FL2 transmitted to all facilities in geographic area requested by pt/family on  10/28/2013 FL2 transmitted to all facilities within larger geographic area on   Patient informed that his/her managed care company has contracts with or will negotiate with  certain facilities, including the following:     Patient/family informed of bed offers received:  10/29/13 Patient chooses bed at [facility] Physician recommends and patient chooses bed at    Patient to be transferred to [facility]  on  10/29/2013 Patient to be transferred to facility by [facility]  The following physician request were entered in Epic:   Additional Comments:

## 2013-10-28 NOTE — Progress Notes (Signed)
Patient ID: Ann [name], female   DOB: Feb 11, 1928, 80 y.o.   MRN: [id number] No acute changes.  Vitals stable.  Right BKA stump dressing clean and intact.  Will need skilled nursing.

## 2013-10-29 ENCOUNTER — Encounter (HOSPITAL_COMMUNITY): Payer: Self-pay | Admitting: Orthopedic Surgery

## 2013-10-29 LAB — PROTIME-INR
INR: 1.72 — ABNORMAL HIGH (ref 0.00–1.49)
Prothrombin Time: 19.7 s — ABNORMAL HIGH (ref 11.6–15.2)

## 2013-10-29 MED ORDER — WARFARIN SODIUM 5 MG PO TABS
5.0000 mg | ORAL_TABLET | Freq: Once | ORAL | Status: AC
  Administered 2013-10-29: 5 mg via ORAL
  Filled 2013-10-29: qty 1

## 2013-10-29 MED ORDER — TRAMADOL HCL 50 MG PO TABS: 50.0000 mg | ORAL_TABLET | Freq: Four times a day (QID) | ORAL | Status: DC | PRN

## 2013-10-29 NOTE — Progress Notes (Signed)
Patient being discharged to [facility] post R BKA 2/20. She is stable and is being transported by [facility]. Family is at bedside and is aware of discharge. Awaiting arrival of [facility] for non-emergent transport.

## 2013-10-29 NOTE — Progress Notes (Signed)
Patient ID: Ann [name], female   DOB: [birth year], 80 y.o.   MRN: [id number] Plan for discharge to skilled nursing facility [facility]. L2 is completed. No complaints this morning.

## 2013-10-29 NOTE — Progress Notes (Signed)
CSW (Clinical Child psychotherapist) received call from facility asking for [facility]. Pt nurse has already spoken with MD about dc paperwork. Facility requesting dc summary asap.  Ann [name], LCSWA [phone]

## 2013-10-29 NOTE — Discharge Instructions (Signed)
Information on my medicine - Coumadin   (Warfarin)  This medication education was reviewed with me or my healthcare representative as part of my discharge preparation.  The pharmacist that spoke with me during my hospital stay was:  Pasty Spillers, [facility]  Why was Coumadin prescribed for you? Coumadin was prescribed for you because you have a blood clot or a medical condition that can cause an increased risk of forming blood clots. Blood clots can cause serious health problems by blocking the flow of blood to the heart, lung, or brain. Coumadin can prevent harmful blood clots from forming. As a reminder your indication for Coumadin is:   Blood Clot Prevention After Orthopedic Surgery  What test will check on my response to Coumadin? While on Coumadin (warfarin) you will need to have an INR test regularly to ensure that your dose is keeping you in the desired range. The INR (international normalized ratio) number is calculated from the result of the laboratory test called prothrombin time (PT).  If an INR APPOINTMENT HAS NOT ALREADY BEEN MADE FOR YOU please schedule an appointment to have this lab work done by your health care provider within 7 days. Your INR goal is usually a number between:  2 to 3 or your provider may give you a more narrow range like 2-2.5.  Ask your health care provider during an office visit what your goal INR is.  What  do you need to  know  About  COUMADIN? Take Coumadin (warfarin) exactly as prescribed by your healthcare provider about the same time each day.  DO NOT stop taking without talking to the doctor who prescribed the medication.  Stopping without other blood clot prevention medication to take the place of Coumadin may increase your risk of developing a new clot or stroke.  Get refills before you run out.  What do you do if you miss a dose? If you miss a dose, take it as soon as you remember on the same day then continue your regularly scheduled  regimen the next day.  Do not take two doses of Coumadin at the same time.  Important Safety Information A possible side effect of Coumadin (Warfarin) is an increased risk of bleeding. You should call your healthcare provider right away if you experience any of the following:   Bleeding from an injury or your nose that does not stop.   Unusual colored urine (red or dark brown) or unusual colored stools (red or black).   Unusual bruising for unknown reasons.   A serious fall or if you hit your head (even if there is no bleeding).  Some foods or medicines interact with Coumadin (warfarin) and might alter your response to warfarin. To help avoid this:   Eat a balanced diet, maintaining a consistent amount of Vitamin K.   Notify your provider about major diet changes you plan to make.   Avoid alcohol or limit your intake to 1 drink for women and 2 drinks for men per day. (1 drink is 5 oz. wine, 12 oz. beer, or 1.5 oz. liquor.)  Make sure that ANY health care provider who prescribes medication for you knows that you are taking Coumadin (warfarin).  Also make sure the healthcare provider who is monitoring your Coumadin knows when you have started a new medication including herbals and non-prescription products.  Coumadin (Warfarin)  Major Drug Interactions  Increased Warfarin Effect Decreased Warfarin Effect  Alcohol (large quantities) Antibiotics (esp. Septra/Bactrim, Flagyl, Cipro) Amiodarone (Cordarone) Aspirin (  ASA) °Cimetidine (Tagamet) °Megestrol (Megace) °NSAIDs (ibuprofen, naproxen, etc.) °Piroxicam (Feldene) °Propafenone (Rythmol SR) °Propranolol (Inderal) °Isoniazid (INH) °Posaconazole (Noxafil) Barbiturates (Phenobarbital) °Carbamazepine (Tegretol) °Chlordiazepoxide (Librium) °Cholestyramine (Questran) °Griseofulvin °Oral Contraceptives °Rifampin °Sucralfate (Carafate) °Vitamin K  ° °Coumadin® (Warfarin) Major Herbal Interactions  °Increased Warfarin Effect Decreased Warfarin Effect   °Garlic °Ginseng °Ginkgo biloba Coenzyme Q10 °Green tea °St. John’s wort   ° °Coumadin® (Warfarin) FOOD Interactions  °Eat a consistent number of servings per week of foods HIGH in Vitamin K °(1 serving = ½ cup)  °Collards (cooked, or boiled & drained) °Kale (cooked, or boiled & drained) °Mustard greens (cooked, or boiled & drained) °Parsley *serving size only = ¼ cup °Spinach (cooked, or boiled & drained) °Swiss chard (cooked, or boiled & drained) °Turnip greens (cooked, or boiled & drained)  °Eat a consistent number of servings per week of foods MEDIUM-HIGH in Vitamin K °(1 serving = 1 cup)  °Asparagus (cooked, or boiled & drained) °Broccoli (cooked, boiled & drained, or raw & chopped) °Brussel sprouts (cooked, or boiled & drained) *serving size only = ½ cup °Lettuce, raw (green leaf, endive, romaine) °Spinach, raw °Turnip greens, raw & chopped  ° °These websites have more information on Coumadin (warfarin):  www.coumadin.com; °www.ahrq.gov/consumer/coumadin.htm; ° ° ° °

## 2013-10-29 NOTE — Progress Notes (Signed)
ANTICOAGULATION CONSULT NOTE - Follow Up Consult  Pharmacy Consult for Coumadin Indication: VTE prophylaxis  No Known Allergies  Patient Measurements: Height: 5' 1.02" (155 cm) Weight: 147 lb 0.8 oz (66.7 kg) IBW/kg (Calculated) : 47.86 Heparin Dosing Weight:    Vital Signs: Temp: 99.4 F (37.4 C) (02/23 0649) Temp src: Oral (02/23 0649) BP: 118/87 mmHg (02/23 0649) Pulse Rate: 80 (02/23 0649)  Labs:  Recent Labs  10/26/13 1126 10/27/13 0442 10/28/13 0500 10/29/13 0535  HGB 11.1*  --   --   --   HCT 33.7*  --   --   --   PLT 288  --   --   --   APTT 31  --   --   --   LABPROT 15.4* 17.3* 16.4* 19.7*  INR 1.25 1.45 1.36 1.72*  CREATININE 1.00  --   --   --     Estimated Creatinine Clearance: 37.3 ml/min (by C-G formula based on Cr of 1).  Assessment: 80 yo female s/p R BKA and to start coumadin for VTE prophylaxis. Patient has a history of DVT (02/2013) and recently stopped coumadin 09/27/13 (INR at that time was 5.4) -Last coumadin dose per patient was 5mg  SuMWTF and 2.5mg  on TSu  Anticoagulation: Hx DVT. Now S/p BKA. INR 1.72 today  Cardiovascular: HTN, HLD -VSS on  ASA81, Norvasc10, lipitor20, HCTZ, toprol  Endocrinology: CBGs ok  Neurology: Sz, dementia - tegretol, Namenda  Nephrology: bmet wnl  Pulmonary: RA  Hematology / Oncology: CBC ok  PTA Medication Issues: reviewed  Best Practices: coumadin  Goal of Therapy:  INR 2-3 Monitor platelets by anticoagulation protocol: Yes   Plan:  Repeat Coumadin 5mg  po x 1 today. Daily INR  Valarie Farace S. Merilynn Finland, PharmD, BCPS Clinical Staff Pharmacist Pager [phone]  Misty Stanley Stillinger 10/29/2013,10:14 AM

## 2013-10-29 NOTE — Progress Notes (Signed)
CSW (Clinical Social Worker) prepared pt dc packet and placed with shadow chart. CSW arranged non-emergent ambulance transport. Pt family, pt nurse, and facility informed. CSW signing off.  Geralyn Figiel, LCSWA [phone]  

## 2013-10-29 NOTE — Care Management Note (Signed)
CARE MANAGEMENT NOTE 10/29/2013  Patient:  Ann [name], Ann [name]   Account Number:  [id number]  Date Initiated:  10/29/2013  Documentation initiated by:  Vance Peper  Subjective/Objective Assessment:   80 yr old female right BKA.     Action/Plan:   patient will need shortterm rehab at SNF, plan is to discharge to [facility]. Social worker is aware.   Anticipated DC Date:  10/29/2013   Anticipated DC Plan:  SKILLED NURSING FACILITY      DC Planning Services  CM consult      [facility] Choice  NA   Choice offered to / List presented to:             Status of service:  Completed, signed off Medicare Important Message given?   (If response is "NO", the following Medicare IM given date fields will be blank) Date Medicare IM given:   Date Additional Medicare IM given:    Discharge Disposition:  SKILLED NURSING FACILITY  Per UR Regulation:    If discussed at Long Length of Stay Meetings, dates discussed:    Comments:

## 2013-10-29 NOTE — Discharge Summary (Signed)
Physician Discharge Summary  Patient ID: Ann [name] MRN: [id number] DOB/AGE: 12/26/1927 80 y.o.  Admit date: 10/26/2013 Discharge date: 10/29/2013  Admission Diagnoses:gangrene foot   Discharge Diagnoses:  Active Problems:   S/P BKA (below knee amputation) unilateral   Discharged Condition: stable  Hospital Course: hospital course was unremarkable, she underwent BKA, post operative she was not independent and required SNF placement  Consults: None  Significant Diagnostic Studies: labs: routine labs  Treatments: surgery: see op note  Discharge Exam: Blood pressure 125/81, pulse 83, temperature 97.1 F (36.2 C), temperature source Oral, resp. rate 17, height 5' 1.02" (1.55 m), weight 66.7 kg (147 lb 0.8 oz), SpO2 95.00%. Incision/Wound:clean and dry  Disposition: 01-Home or Self Care  Discharge Orders   Future Orders Complete By Expires   Call MD / Call 911  As directed    Comments:     If you experience chest pain or shortness of breath, CALL 911 and be transported to the hospital emergency room.  If you develope a fever above 101 F, pus (white drainage) or increased drainage or redness at the wound, or calf pain, call your surgeon's office.   Constipation Prevention  As directed    Comments:     Drink plenty of fluids.  Prune juice may be helpful.  You may use a stool softener, such as Colace (over the counter) 100 mg twice a day.  Use MiraLax (over the counter) for constipation as needed.   Diet - low sodium heart healthy  As directed    Increase activity slowly as tolerated  As directed        Medication List         amLODipine 10 MG tablet  Commonly known as:  NORVASC  Take 10 mg by mouth daily.     aspirin 81 MG chewable tablet  Chew 81 mg by mouth daily.     atorvastatin 20 MG tablet  Commonly known as:  LIPITOR  Take 1 tablet (20 mg total) by mouth daily.     carbamazepine 300 MG 12 hr capsule  Commonly known as:  CARBATROL  Take 300 mg by mouth 2  (two) times daily.     hydrochlorothiazide 25 MG tablet  Commonly known as:  HYDRODIURIL  Take 25 mg by mouth daily.     memantine 10 MG tablet  Commonly known as:  NAMENDA  Take 1 tablet (10 mg total) by mouth 2 (two) times daily.     metoprolol succinate 50 MG 24 hr tablet  Commonly known as:  TOPROL-XL  Take 50 mg by mouth 2 (two) times daily. Take with or immediately following a meal.     nystatin 100000 UNIT/ML suspension  Commonly known as:  MYCOSTATIN  Take 5 mLs (500,000 Units total) by mouth 4 (four) times daily. Swish in mouth for at least a minute before swallowing. Use x 5-7 days     traMADol 50 MG tablet  Commonly known as:  ULTRAM  Take 1 tablet (50 mg total) by mouth every 6 (six) hours as needed.     traMADol 50 MG tablet  Commonly known as:  ULTRAM  Take 1 tablet (50 mg total) by mouth every 6 (six) hours as needed. Maximum dose= 8 tablets per day           Follow-up Information   Follow up with DUDA,MARCUS V, MD In 2 weeks.   Specialty:  Orthopedic Surgery   Contact information:   [address]  [place] [address] [phone]       Signed: Nadara MustardDUDA,MARCUS V 10/29/2013, 4:09 PM

## 2013-10-30 ENCOUNTER — Non-Acute Institutional Stay (INDEPENDENT_AMBULATORY_CARE_PROVIDER_SITE_OTHER): Payer: Medicare Other | Admitting: Sports Medicine

## 2013-10-30 DIAGNOSIS — Z89519 Acquired absence of unspecified leg below knee: Secondary | ICD-10-CM

## 2013-10-30 DIAGNOSIS — S88119A Complete traumatic amputation at level between knee and ankle, unspecified lower leg, initial encounter: Secondary | ICD-10-CM

## 2013-10-30 DIAGNOSIS — Z593 Problems related to living in residential institution: Secondary | ICD-10-CM

## 2013-10-30 DIAGNOSIS — F068 Other specified mental disorders due to known physiological condition: Secondary | ICD-10-CM

## 2013-10-30 DIAGNOSIS — I10 Essential (primary) hypertension: Secondary | ICD-10-CM

## 2013-10-30 DIAGNOSIS — R569 Unspecified convulsions: Secondary | ICD-10-CM

## 2013-10-30 NOTE — Progress Notes (Signed)
[facility] Admission H&P  Ann [name] 80 y.o. female  MRN: [id number]  DOB: 16-Oct-1927   PCP: Hilarie FredricksonAmber M Hairford, MD  CONSULTANTS: Aldean BakerMarcus Duda, MD - Orthopedics (Surgery 10/26/13) CODE STATUS: Full code  PATIENT OVERVIEW:  Admitted on: 10/29/13 PM Reason for admission: Short Term Rehab following Right BKA for gangrene of foot. Her PMHx is significant for HTN, EtOH abuse, CVA, Dementia, Seizure disorder, hx of DVT (not on anticoagulation), PAD, no known CAD or CHF.  HPI & ROS: Ann [name] is a 80 y.o. year old female presenting to [facility] for acute rehabilitation following right BKA.  She had previously been living at home receiving significant care in her ADLs from family members.  She underwent right BKA due to due gangrene of the foot with known non-interventional peripheral vascular disease by Dr. Lajoyce Cornersuda.  At baseline her family reports she is typically oriented to person place and time and able to perform some of her activities of daily living but she no longer cooks.    Further ROS: Patient minimally verbal on exam however is able to deniy pain  HISTORY: Past Medical History  Diagnosis Date  . Hypertension   . Dementia   . Hyperlipidemia   . Seizures   . Metatarsal bone fracture     Right - base of 5th  . DVT (deep venous thrombosis) 02/22/2013    02/18/2013: Left lower extremity (femoral and popliteal) subacute Treated with coumadin x 6-7 months    . Stroke   . Trigeminal neuralgia 11/03/2006    Qualifier: Diagnosis of  By: Abundio MiuMcGregor, Barbara    . CATARACT 11/03/2006    Qualifier: Diagnosis of  By: Abundio MiuMcGregor, Barbara    . HEARING LOSS NOS OR DEAFNESS 11/03/2006    Qualifier: Diagnosis of  By: Abundio MiuMcGregor, Barbara    . INCONTINENCE, URGE 11/03/2006    Qualifier: Diagnosis of  By: Abundio MiuMcGregor, Barbara     Past Surgical History  Procedure Laterality Date  . Toe surgery      right 5th toe amputation  . Amputation Right 10/26/2013     Procedure: AMPUTATION BELOW KNEE;  Surgeon: Nadara MustardMarcus V Duda, MD;  Location: [facility] OR;  Service: Orthopedics;  Laterality: Right;  Right Below Knee Amputation   Social Hx: History   Social History  . Marital Status: Widowed    Spouse Name: N/A    Number of Children: N/A  . Years of Education: N/A   Social History Main Topics  . Smoking status: Never Smoker   . Smokeless tobacco: Never Used  . Alcohol Use: No  . Drug Use: No  . Sexual Activity: Not on file   Other Topics Concern  . Not on file   Social History Narrative  . No narrative on file   Family History  Problem Relation Age of Onset  . Hypertension Mother   . Arthritis Father   . Heart disease Daughter     Heart Disease before age 78  . Hyperlipidemia Daughter   . Hypertension Daughter    No Known Allergies Home Medications: Prior to Admission medications   Medication Sig Start Date End Date Taking? Authorizing Provider  amLODipine (NORVASC) 10 MG tablet Take 10 mg by mouth daily.    Historical Provider, MD  aspirin 81 MG chewable tablet Chew 81 mg by mouth daily. 02/22/13   Charm RingsErin J Honig, MD  atorvastatin (LIPITOR) 20 MG tablet Take 1 tablet (20 mg total) by mouth daily. 10/24/13  Shelva Majestic, MD  carbamazepine (CARBATROL) 300 MG 12 hr capsule Take 300 mg by mouth 2 (two) times daily.    Historical Provider, MD  hydrochlorothiazide (HYDRODIURIL) 25 MG tablet Take 25 mg by mouth daily.    Historical Provider, MD  memantine (NAMENDA) 10 MG tablet Take 1 tablet (10 mg total) by mouth 2 (two) times daily. 10/24/13   Shelva Majestic, MD  metoprolol succinate (TOPROL-XL) 50 MG 24 hr tablet Take 50 mg by mouth 2 (two) times daily. Take with or immediately following a meal.    Historical Provider, MD  traMADol (ULTRAM) 50 MG tablet Take 1 tablet (50 mg total) by mouth every 6 (six) hours as needed. Maximum dose= 8 tablets per day 10/29/13   Nadara Mustard, MD    OBJECTIVE: VITALS: HR:   bpm  BP:    TEMP:   ( )  RESP:     HT:    WT:    BMI:     BASELINE WEIGHT:  147-151     Physical Exam:  GENERAL: Elderly African American  female. In no discomfort; no respiratory distress  PSYCH: alert and appropriate, good insight   HNEENT:   CARDIO: RRR, S1/S2 heard, no murmur  LUNGS: CTA B, no wheezes, no crackles  ABDOMEN:   EXTREM:    GU:   SKIN:    LABS:  Basic Labs Extended:   Recent Labs Lab 10/26/13 1126  WBC 4.3  HGB 11.1*  HCT 33.7*  PLT 288     Recent Labs Lab 10/26/13 1126  NA 142  K 4.5  CL 102  CO2 24  BUN 27*  CREATININE 1.00  GLUCOSE 96  CALCIUM 9.1     Recent Labs Lab 10/26/13 1126  ALBUMIN 3.1*  ALT 41*  AST 48*  ALKPHOS 126*  BILITOT 0.4   No results found for this basename: HGBA1C, TRIG, CHOL, HDL, LDLCALC, LDLDIRECT, TSH, T3FREE, FREET4, T3TOTAL, T4TOTAL, THYROIDAB,  in the last 8760 hours   Recent Labs  02/18/13 1608  CBMZ 7.1      ASSESSMENT & PLAN:  See problem based charting & AVS for pt instructions.

## 2013-10-31 ENCOUNTER — Non-Acute Institutional Stay: Payer: Medicare Other | Admitting: Family Medicine

## 2013-10-31 ENCOUNTER — Encounter: Payer: Self-pay | Admitting: Family Medicine

## 2013-10-31 DIAGNOSIS — S88119A Complete traumatic amputation at level between knee and ankle, unspecified lower leg, initial encounter: Secondary | ICD-10-CM

## 2013-10-31 DIAGNOSIS — Z593 Problems related to living in residential institution: Secondary | ICD-10-CM

## 2013-10-31 DIAGNOSIS — F068 Other specified mental disorders due to known physiological condition: Secondary | ICD-10-CM

## 2013-10-31 DIAGNOSIS — Z789 Other specified health status: Secondary | ICD-10-CM

## 2013-10-31 DIAGNOSIS — I82409 Acute embolism and thrombosis of unspecified deep veins of unspecified lower extremity: Secondary | ICD-10-CM

## 2013-10-31 DIAGNOSIS — Z89519 Acquired absence of unspecified leg below knee: Secondary | ICD-10-CM

## 2013-10-31 DIAGNOSIS — I10 Essential (primary) hypertension: Secondary | ICD-10-CM

## 2013-10-31 DIAGNOSIS — R569 Unspecified convulsions: Secondary | ICD-10-CM

## 2013-10-31 MED ORDER — TRAMADOL HCL 50 MG PO TABS: 50.0000 mg | ORAL_TABLET | Freq: Four times a day (QID) | ORAL | Status: DC | PRN

## 2013-10-31 NOTE — Assessment & Plan Note (Signed)
A: BP at goal. Meds: complaint P: continue current regimen for now.

## 2013-10-31 NOTE — Assessment & Plan Note (Addendum)
Last seizure mentioned by PCP was 05/2013.  Plan to continue carbamazepine.  Noted slightly elevated LFTs from baseline during recent hospitalization.   P: Repeat carbamazepine level.  Monitor levels and LFTs, repeat in 3 months with carbamazepine level. Repeat sooner prn seizures/sedation/GI symptoms.  atient is established with [facility]

## 2013-10-31 NOTE — Assessment & Plan Note (Signed)
A: dementia most likely vascular  P: Continue namenda mmse- geri resident or PCP to document.  ? Aricept i do not see where patient was on Aricept in the past. If this has not been tried and failed adding Aricept to namenda would be beneficial depending on her MMSE.

## 2013-10-31 NOTE — Assessment & Plan Note (Signed)
Upon chart review patient took coumadin from 6/14-1/15 then it was D/Cd with agreement from patient, her family and PCP. Will resolve this problem.

## 2013-10-31 NOTE — Assessment & Plan Note (Signed)
A: stable.  P; Tramadol prn pain. Monitor for sedation/seizures.  If patient is requiring more than the occasional tramadol oxy IR would be a good choice.  Avoid tylenol in the setting of carbamazepine use.

## 2013-10-31 NOTE — Progress Notes (Signed)
  Geriatric Attending H&P Subjective:    Patient ID: Ann [name], female    DOB: 1927/10/01, 80 y.o.   MRN: [id number]  HPI 80 yo F with history of dementia, seizures, CVA and remote alcohol abuse seen following admission to [facility] following hospitalization from 2/20-2/23/15 for  Gangrenous R foot requiring R BKA. Patient is admitted for short term rehab but given her history of dementia is likely that she will have a prolonged stay.   1. R BKA: done 10/26/13. Routine post op course. Patient unable to ambulate independently so she was admitted to SNF. She did not require antibiotic coverage. She admits to some pain in her leg today. Taking tramadol prn pain.   2. Dementia: patient is alone in her room. She acknowledged me upon entrance and asked to watch TV. She seems to answers questions appropriately but is not oriented. Reviewed chart, no documented MMSE. On namenda. Per chart had decline in function off namenda.   3. HTN: ros is neg for CP, SOB, LE edema.  Patient complaint with medications.   4. Seizures:  Last seizure mentioned in chart by PCP was 05/2013. Unsure of onset. CT head from 2005 mentions seizures.  Treated with carbamazepine. Last carbamazepine level in EPIC was  7.1 on 02/19/13.   Note patient has tramadol prn pain and has been on it chronically.    5. LLE DVT: 6/14, proximal. Treated with coumadin x 6-7 months.   History  Substance Use Topics  . Smoking status: Never Smoker   . Smokeless tobacco: Never Used  . Alcohol Use: No   Reviewed PMHx:   Review of Systems As per HPI      Objective:   Physical Exam BP 127/96  Pulse 97  Temp(Src) 99.1 F (37.3 C)  Resp 18  Wt 139 lb 6.4 oz (63.231 kg)  SpO2 97% General appearance: alert, cooperative and no distress Neck: no adenopathy, no carotid bruit, no JVD, supple, symmetrical, trachea midline and thyroid not enlarged, symmetric, no tenderness/mass/nodules Lungs: clear to auscultation  bilaterally Heart: irregularly irregular rhythm, no MRG Abdomen: soft, non-tender; bowel sounds normal; no masses,  no organomegaly Extremities: R BKA, normal Left w/o edema.skin warm and dry  Pulses: Right Pulses: FEM: present 2+,Left Pulses: FEM: present 2+, POP: absent, DP: absent, PT: absent Neurologic: alert, no distress. Moves all 4 ext spontaneously. Not oriented.   Lab Results  Component Value Date   WBC 4.3 10/26/2013   HGB 11.1* 10/26/2013   HCT 33.7* 10/26/2013   MCV 89.6 10/26/2013   PLT 288 10/26/2013     Chemistry      Component Value Date/Time   NA 142 10/26/2013 1126   K 4.5 10/26/2013 1126   CL 102 10/26/2013 1126   CO2 24 10/26/2013 1126   BUN 27* 10/26/2013 1126   CREATININE 1.00 10/26/2013 1126   CREATININE 1.11* 02/28/2013 1652      Component Value Date/Time   CALCIUM 9.1 10/26/2013 1126   ALKPHOS 126* 10/26/2013 1126   AST 48* 10/26/2013 1126   ALT 41* 10/26/2013 1126   BILITOT 0.4 10/26/2013 1126          Assessment & Plan:

## 2013-11-01 ENCOUNTER — Encounter: Payer: Self-pay | Admitting: Sports Medicine

## 2013-11-01 NOTE — Assessment & Plan Note (Signed)
No prior MMSE noted.  Will plan to perform in next 1-2 weeks once patient is adjusted to her new living arrangements. Likely would benefit from starting Aricept.

## 2013-11-01 NOTE — Assessment & Plan Note (Signed)
Followup appoint with Dr. Lajoyce Corners to in 2 weeks.

## 2013-11-01 NOTE — Assessment & Plan Note (Signed)
Plan for short-term rehabilitation

## 2013-11-02 LAB — LIPID PANEL
Cholesterol: 139 mg/dL (ref 0–200)
HDL: 34 mg/dL — AB (ref 35–70)
LDL CALC: 93 mg/dL
TRIGLYCERIDES: 60 mg/dL (ref 40–160)

## 2013-11-02 NOTE — Assessment & Plan Note (Signed)
Continue current medications. 

## 2013-11-02 NOTE — Assessment & Plan Note (Signed)
Unclear when last seizure.  On carbamazepine, recheck level

## 2013-11-06 ENCOUNTER — Encounter: Payer: Self-pay | Admitting: Sports Medicine

## 2013-11-06 LAB — CARBAMAZEPINE LEVEL, TOTAL: Carbamazepine, Total: 9.2

## 2013-11-18 ENCOUNTER — Telehealth: Payer: Self-pay | Admitting: Family Medicine

## 2013-11-18 ENCOUNTER — Emergency Department (HOSPITAL_COMMUNITY): Payer: Medicare Other

## 2013-11-18 ENCOUNTER — Inpatient Hospital Stay (HOSPITAL_COMMUNITY)
Admission: EM | Admit: 2013-11-18 | Discharge: 2013-11-23 | DRG: 866 | Disposition: A | Discharge status: Home Health | Payer: Medicare Other | Attending: Family Medicine | Admitting: Family Medicine

## 2013-11-18 ENCOUNTER — Encounter (HOSPITAL_COMMUNITY): Payer: Self-pay | Admitting: Emergency Medicine

## 2013-11-18 DIAGNOSIS — I4891 Unspecified atrial fibrillation: Secondary | ICD-10-CM

## 2013-11-18 DIAGNOSIS — M25451 Effusion, right hip: Secondary | ICD-10-CM

## 2013-11-18 DIAGNOSIS — F068 Other specified mental disorders due to known physiological condition: Secondary | ICD-10-CM | POA: Diagnosis present

## 2013-11-18 DIAGNOSIS — Z6826 Body mass index (BMI) 26.0-26.9, adult: Secondary | ICD-10-CM

## 2013-11-18 DIAGNOSIS — Y92129 Unspecified place in nursing home as the place of occurrence of the external cause: Secondary | ICD-10-CM

## 2013-11-18 DIAGNOSIS — Y9210 Unspecified residential institution as the place of occurrence of the external cause: Secondary | ICD-10-CM | POA: Diagnosis present

## 2013-11-18 DIAGNOSIS — H9190 Unspecified hearing loss, unspecified ear: Secondary | ICD-10-CM | POA: Diagnosis present

## 2013-11-18 DIAGNOSIS — Z9181 History of falling: Secondary | ICD-10-CM

## 2013-11-18 DIAGNOSIS — M25459 Effusion, unspecified hip: Secondary | ICD-10-CM | POA: Diagnosis present

## 2013-11-18 DIAGNOSIS — F1011 Alcohol abuse, in remission: Secondary | ICD-10-CM | POA: Diagnosis present

## 2013-11-18 DIAGNOSIS — Z86718 Personal history of other venous thrombosis and embolism: Secondary | ICD-10-CM

## 2013-11-18 DIAGNOSIS — R1319 Other dysphagia: Secondary | ICD-10-CM | POA: Diagnosis present

## 2013-11-18 DIAGNOSIS — Z7982 Long term (current) use of aspirin: Secondary | ICD-10-CM

## 2013-11-18 DIAGNOSIS — F0390 Unspecified dementia without behavioral disturbance: Secondary | ICD-10-CM | POA: Diagnosis present

## 2013-11-18 DIAGNOSIS — R509 Fever, unspecified: Secondary | ICD-10-CM

## 2013-11-18 DIAGNOSIS — Z89519 Acquired absence of unspecified leg below knee: Secondary | ICD-10-CM

## 2013-11-18 DIAGNOSIS — W19XXXA Unspecified fall, initial encounter: Secondary | ICD-10-CM

## 2013-11-18 DIAGNOSIS — R569 Unspecified convulsions: Secondary | ICD-10-CM | POA: Diagnosis present

## 2013-11-18 DIAGNOSIS — I10 Essential (primary) hypertension: Secondary | ICD-10-CM

## 2013-11-18 DIAGNOSIS — M25559 Pain in unspecified hip: Secondary | ICD-10-CM

## 2013-11-18 DIAGNOSIS — I70209 Unspecified atherosclerosis of native arteries of extremities, unspecified extremity: Secondary | ICD-10-CM | POA: Diagnosis present

## 2013-11-18 DIAGNOSIS — S88119A Complete traumatic amputation at level between knee and ankle, unspecified lower leg, initial encounter: Secondary | ICD-10-CM

## 2013-11-18 DIAGNOSIS — R1310 Dysphagia, unspecified: Secondary | ICD-10-CM

## 2013-11-18 DIAGNOSIS — T783XXA Angioneurotic edema, initial encounter: Secondary | ICD-10-CM | POA: Diagnosis not present

## 2013-11-18 DIAGNOSIS — R4182 Altered mental status, unspecified: Secondary | ICD-10-CM

## 2013-11-18 DIAGNOSIS — G40909 Epilepsy, unspecified, not intractable, without status epilepticus: Secondary | ICD-10-CM | POA: Diagnosis present

## 2013-11-18 DIAGNOSIS — T46905A Adverse effect of unspecified agents primarily affecting the cardiovascular system, initial encounter: Secondary | ICD-10-CM | POA: Diagnosis not present

## 2013-11-18 DIAGNOSIS — I428 Other cardiomyopathies: Secondary | ICD-10-CM | POA: Diagnosis present

## 2013-11-18 DIAGNOSIS — I959 Hypotension, unspecified: Secondary | ICD-10-CM | POA: Diagnosis not present

## 2013-11-18 DIAGNOSIS — E440 Moderate protein-calorie malnutrition: Secondary | ICD-10-CM

## 2013-11-18 DIAGNOSIS — E785 Hyperlipidemia, unspecified: Secondary | ICD-10-CM | POA: Diagnosis present

## 2013-11-18 DIAGNOSIS — Z8673 Personal history of transient ischemic attack (TIA), and cerebral infarction without residual deficits: Secondary | ICD-10-CM

## 2013-11-18 DIAGNOSIS — B9789 Other viral agents as the cause of diseases classified elsewhere: Principal | ICD-10-CM | POA: Diagnosis present

## 2013-11-18 DIAGNOSIS — Z8249 Family history of ischemic heart disease and other diseases of the circulatory system: Secondary | ICD-10-CM

## 2013-11-18 DIAGNOSIS — K219 Gastro-esophageal reflux disease without esophagitis: Secondary | ICD-10-CM

## 2013-11-18 LAB — CBC WITH DIFFERENTIAL/PLATELET
BASOS ABS: 0 10*3/uL (ref 0.0–0.1)
BASOS PCT: 0 % (ref 0–1)
EOS ABS: 0 10*3/uL (ref 0.0–0.7)
EOS PCT: 0 % (ref 0–5)
HCT: 34.8 % — ABNORMAL LOW (ref 36.0–46.0)
Hemoglobin: 11.5 g/dL — ABNORMAL LOW (ref 12.0–15.0)
LYMPHS ABS: 0.7 10*3/uL (ref 0.7–4.0)
Lymphocytes Relative: 9 % — ABNORMAL LOW (ref 12–46)
MCH: 28.6 pg (ref 26.0–34.0)
MCHC: 33 g/dL (ref 30.0–36.0)
MCV: 86.6 fL (ref 78.0–100.0)
Monocytes Absolute: 0.9 10*3/uL (ref 0.1–1.0)
Monocytes Relative: 11 % (ref 3–12)
NEUTROS PCT: 79 % — AB (ref 43–77)
Neutro Abs: 6.3 10*3/uL (ref 1.7–7.7)
Platelets: 201 10*3/uL (ref 150–400)
RBC: 4.02 MIL/uL (ref 3.87–5.11)
RDW: 13.9 % (ref 11.5–15.5)
WBC: 8 10*3/uL (ref 4.0–10.5)

## 2013-11-18 LAB — I-STAT CHEM 8, ED
BUN: 35 mg/dL — ABNORMAL HIGH (ref 6–23)
CREATININE: 1.1 mg/dL (ref 0.50–1.10)
Calcium, Ion: 1.12 mmol/L — ABNORMAL LOW (ref 1.13–1.30)
Chloride: 103 mEq/L (ref 96–112)
Glucose, Bld: 110 mg/dL — ABNORMAL HIGH (ref 70–99)
HEMATOCRIT: 37 % (ref 36.0–46.0)
HEMOGLOBIN: 12.6 g/dL (ref 12.0–15.0)
Potassium: 3.8 mEq/L (ref 3.7–5.3)
SODIUM: 143 meq/L (ref 137–147)
TCO2: 27 mmol/L (ref 0–100)

## 2013-11-18 LAB — URINE MICROSCOPIC-ADD ON

## 2013-11-18 LAB — URINALYSIS, ROUTINE W REFLEX MICROSCOPIC
Glucose, UA: NEGATIVE mg/dL
KETONES UR: 15 mg/dL — AB
Leukocytes, UA: NEGATIVE
NITRITE: NEGATIVE
PROTEIN: 100 mg/dL — AB
Specific Gravity, Urine: 1.031 — ABNORMAL HIGH (ref 1.005–1.030)
Urobilinogen, UA: 2 mg/dL — ABNORMAL HIGH (ref 0.0–1.0)
pH: 5 (ref 5.0–8.0)

## 2013-11-18 MED ORDER — SODIUM CHLORIDE 0.9 % IV BOLUS (SEPSIS)
500.0000 mL | Freq: Once | INTRAVENOUS | Status: AC
  Administered 2013-11-18: 500 mL via INTRAVENOUS

## 2013-11-18 MED ORDER — ACETAMINOPHEN 325 MG PO TABS
650.0000 mg | ORAL_TABLET | Freq: Once | ORAL | Status: AC
  Administered 2013-11-18: 650 mg via ORAL
  Filled 2013-11-18: qty 2

## 2013-11-18 NOTE — ED Provider Notes (Signed)
CSN: [id number]     Arrival date & time 11/18/13  1842 History   First MD Initiated Contact with Patient 11/18/13 1850     Chief Complaint  Patient presents with  . Fall     (Consider location/radiation/quality/duration/timing/severity/associated sxs/prior Treatment) HPI Comments: Ann [name] is a 80 y.o. Female presenting for evaluation with a Ct scan of her hip after being found on the floor next to her bed twice today at her nursing facility where she is in rehab for a right bka 3 weeks ago secondary to peripheral vascular disease and subsequent gangrene.  She was not lying on the floor for more than 10 minutes and there was no complaint of head injury.   She was evaluated by her physician after there was continued complaint of pain and felt she was possibly internally rotated in her right hip joint.  Plain films were negative so she was sent here for CT scans of her right hip.  Patient has dementia at baseline and does not contribute to the history.  She also has a history of seizure disorder but nursing staff notes indicate she was alert around the time of her falls.  Daughter at bedside reports a gradual decline in her level of alertness over the past several weeks, there has not been an abrupt change today.   The history is provided by the nursing home, a relative and medical records.    Past Medical History  Diagnosis Date  . Hypertension   . Dementia   . Hyperlipidemia   . Seizures   . Metatarsal bone fracture     Right - base of 5th  . DVT (deep venous thrombosis) 02/22/2013    02/18/2013: Left lower extremity (femoral and popliteal) subacute Treated with coumadin x 6-7 months    . Stroke   . Trigeminal neuralgia 11/03/2006    Qualifier: Diagnosis of  By: Abundio Miu    . CATARACT 11/03/2006    Qualifier: Diagnosis of  By: Abundio Miu    . HEARING LOSS NOS OR DEAFNESS 11/03/2006    Qualifier: Diagnosis of  By: Abundio Miu    . INCONTINENCE, URGE 11/03/2006   Qualifier: Diagnosis of  By: Abundio Miu     Past Surgical History  Procedure Laterality Date  . Toe surgery      right 5th toe amputation  . Amputation Right 10/26/2013    Procedure: AMPUTATION BELOW KNEE;  Surgeon: Nadara Mustard, MD;  Location: [facility] OR;  Service: Orthopedics;  Laterality: Right;  Right Below Knee Amputation   Family History  Problem Relation Age of Onset  . Hypertension Mother   . Arthritis Father   . Heart disease Daughter     Heart Disease before age 68  . Hyperlipidemia Daughter   . Hypertension Daughter    History  Substance Use Topics  . Smoking status: Never Smoker   . Smokeless tobacco: Never Used  . Alcohol Use: No   OB History   Grav Para Term Preterm Abortions TAB SAB Ect Mult Living                 Review of Systems  Unable to perform ROS: Dementia  Musculoskeletal: Positive for arthralgias.      Allergies  Review of patient's allergies indicates no known allergies.  Home Medications   Current Outpatient Rx  Name  Route  Sig  Dispense  Refill  . amLODipine (NORVASC) 10 MG tablet   Oral   Take 10 mg by  mouth daily.         Marland Kitchen aspirin 81 MG tablet   Oral   Take 81 mg by mouth daily.         Marland Kitchen atorvastatin (LIPITOR) 20 MG tablet   Oral   Take 1 tablet (20 mg total) by mouth daily.   30 tablet   5   . bisacodyl (DULCOLAX) 10 MG suppository   Rectal   Place 10 mg rectally daily as needed for moderate constipation.         . carbamazepine (CARBATROL) 300 MG 12 hr capsule   Oral   Take 300 mg by mouth 2 (two) times daily.         . hydrochlorothiazide (HYDRODIURIL) 25 MG tablet   Oral   Take 25 mg by mouth daily.         . magnesium hydroxide (MILK OF MAGNESIA) 800 MG/5ML suspension   Oral   Take 30 mLs by mouth daily as needed for constipation.         . memantine (NAMENDA) 10 MG tablet   Oral   Take 1 tablet (10 mg total) by mouth 2 (two) times daily.   180 tablet   1   . metoprolol succinate  (TOPROL-XL) 50 MG 24 hr tablet   Oral   Take 50 mg by mouth 2 (two) times daily. Take with or immediately following a meal.         . Sodium Phosphates (RA SALINE ENEMA) 19-7 GM/118ML ENEM   Rectal   Place 1 each rectally daily as needed (for severe constipation not relieved by Bisacodyl).         . traMADol (ULTRAM) 50 MG tablet   Oral   Take 50 mg by mouth every 6 (six) hours as needed.          BP 99/75  Pulse 99  Temp(Src) 99.9 F (37.7 C) (Rectal)  Resp 22  SpO2 100% Physical Exam  Nursing note and vitals reviewed. Constitutional: She appears well-developed and well-nourished. No distress.  Patient is lying in left decubitus position and will not straighten her legs,  Appears contracted.  febrile  HENT:  Head: Normocephalic and atraumatic.  Eyes: Conjunctivae are normal.  Neck: Normal range of motion and full passive range of motion without pain. Neck supple.  Cardiovascular: Normal rate, regular rhythm, normal heart sounds and intact distal pulses.   Pulmonary/Chest: Effort normal and breath sounds normal. She has no wheezes.  Abdominal: Soft. Bowel sounds are normal. There is no tenderness.  Musculoskeletal: Normal range of motion. She exhibits tenderness.  Patient winces with attempts at ROM of her right hip.  Difficult exam, patient is contracted.  No visible deformity, no ecchymoses.   Neurological:  Responds to voice and touch, otherwise is very drowsy.  Skin: Skin is warm and dry.  Psychiatric: She has a normal mood and affect.    ED Course  Procedures (including critical care time) Labs Review Labs Reviewed  URINALYSIS, ROUTINE W REFLEX MICROSCOPIC - Abnormal; Notable for the following:    Color, Urine ORANGE (*)    APPearance CLOUDY (*)    Specific Gravity, Urine 1.031 (*)    Hgb urine dipstick TRACE (*)    Bilirubin Urine MODERATE (*)    Ketones, ur 15 (*)    Protein, ur 100 (*)    Urobilinogen, UA 2.0 (*)    All other components within normal  limits  CBC WITH DIFFERENTIAL - Abnormal; Notable for the following:  Hemoglobin 11.5 (*)    HCT 34.8 (*)    Neutrophils Relative % 79 (*)    Lymphocytes Relative 9 (*)    All other components within normal limits  URINE MICROSCOPIC-ADD ON - Abnormal; Notable for the following:    Squamous Epithelial / LPF FEW (*)    Casts GRANULAR CAST (*)    All other components within normal limits  I-STAT CHEM 8, ED - Abnormal; Notable for the following:    BUN 35 (*)    Glucose, Bld 110 (*)    Calcium, Ion 1.12 (*)    All other components within normal limits   Imaging Review Dg Chest 2 View  11/18/2013   CLINICAL DATA:  Cough.  EXAM: CHEST  2 VIEW  COMPARISON:  DG CHEST 2 VIEW dated 02/18/2013; DG CHEST 1 VIEW dated 08/27/2010; DG CHEST 2 VIEW dated 05/25/2010  FINDINGS: Mediastinum and hilar structures are stable. Thoracic aorta and great vessels are tortuous. Stable cardiomegaly. Normal pulmonary vascularity. No pleural effusion or pneumothorax. Basilar atelectasis and/or scarring. Old right rib fractures are present.  IMPRESSION: 1. Stable cardiomegaly with tortuous aorta and great vessels. 2. Stable basilar atelectasis and/or scarring.   Electronically Signed   By: Maisie Fushomas  Register   On: 11/18/2013 20:56   Ct Hip Right Wo Contrast  11/18/2013   CLINICAL DATA:  Right hip pain after a fall.  EXAM: CT OF THE RIGHT HIP WITHOUT CONTRAST  TECHNIQUE: Multidetector CT imaging was performed according to the standard protocol. Multiplanar CT image reconstructions were also generated.  COMPARISON:  None.  FINDINGS: There is no fracture or dislocation. There is slight chondrocalcinosis of the right hip with small osteophytes on the posterior inferior aspect of the right acetabulum. There is suggestion of a small hip effusion.  The visualized pelvic bones are intact. Right sacroiliac joint is fused. The patient has severe spinal stenosis at L4-5 and at L5-S1 with right foraminal stenosis at both levels. Soft  tissues around the hip appear normal.  IMPRESSION: 1. No acute abnormality of the right hip.  Mild arthritic changes. 2. No severe spinal stenosis at L4-5 and L5-S1 with right foraminal stenosis of both levels.   Electronically Signed   By: Geanie CooleyJim  Maxwell M.D.   On: 11/18/2013 20:14     EKG Interpretation None      MDM   Final diagnoses:  Hip pain  Fall at nursing home  Fever    Pt was also seen by Dr Bernette MayersSheldon during this visit.  No fractures per Ct.  Fever of unclear etiology.  We have asked patients primary service to consult prior to return to her nursing facility given her new fever.    Burgess AmorJulie Siniyah Evangelist, PA-C 11/18/13 2258

## 2013-11-18 NOTE — ED Notes (Signed)
Report given to Anna, RN 

## 2013-11-18 NOTE — H&P (Signed)
Family Medicine Teaching [facility] Admission History and Physical Service Pager: [phone]  Patient name: Ann [name] Medical record number: [id number] Date of birth: 13-Sep-1927 Age: 80 y.o. Gender: female  Primary Care Provider: Rodman PickleHAIRFORD, AMBER, MD Consultants: None Code Status: full (discussed pt's daughter upon admission)  Chief Complaint: fever, altered mental status  Assessment and Plan: Ann NephewDaisy M [name] is a 80 y.o. female presenting with fever, hip pain, and altered mental status . PMH is significant for dementia, seizures, CVA and remote alcohol abuse seen following admission to [facility] following hospitalization from 2/20-2/23/15 for Gangrenous R foot requiring R BKA, hx of DVT (not on anticoagulation), PAD, no known CAD or CHF.  #Altered Mental Status/fever of unknown origin: Daughter reports that her mother has not been acting herself since Friday. At baseline she is able to sit up and watch TV. Patient does not follow commands on exam.  CBC and CMP are normal with a UA and CXR showing no signs of infection.  No meningeal signs on exam. Hemodynamically stable and not meeting SIRS criteria. Will place in observation and monitor for recurrence of fever. Suspect this is related to viral syndrome on top of dementia/delirium. - Admitted to observation on telemetry, Dr. Jennette KettleNeal attending  - Head CT - if febrile again, will start Vanc/Zosyn  - f/u blood cx  - f/u urine cx  - CBC and BMP am   #Recent fall: CT was negative of the right hip. Patient didn't hit her head. She is currently [facility]. - strict bed rest  #PAD s/p R BKA: Currently residing in nursing home for acute rehab following the surgery.  Surgery occurred due to  gangrene of the foot with known non-interventional peripheral vascular disease by Dr. Lajoyce Cornersuda. - lipitor 20 mg  - hold home tramadol due to hx of seizures  #Dementia: On review of chart there is no documented  MMSE. Currently on namenda.   - Namenda 10 mg BID   #HTN: currently well controlled. Continue home medications.  - amlodipine 10 mg daily  - metoprolol 50 mg BID  - HCTZ 25 mg daily   #Seizures: In reviewing chart, last known seizure was 05/2013. Last carbamazepine level in EPIC was 7.1 on 02/19/13.  - CT head as above  - Seizure precautions.  - Carbamezepine 300 mg BID   FEN/GI: NS 50 mL/hr  Prophylaxis: SCD's    Disposition: admitted to family medicine teaching service for observation, Dr. Jennette KettleNeal attending.   History of Present Illness: Ann NephewDaisy M [name] is a 80 y.o. female presenting with fever in the ED. History provided by daughter, who is a somewhat poor historian. Patient has baseline dementia. The daughter reports that she seems different. She seems more confused (saying the same word over and over, "Mama") than normal. Daughter did not she her yesterday and last saw her on Friday. She reports that she was sleepy on Friday.  [facility] to have a CT scan of her hip after being found down on the floor twice today. While the patient was in the ED she became febrile x1.  She was given tylenol which her fever responded to and 500 mL bolus of NS.    Review Of Systems: Per HPI with the following additions: see hpi  Otherwise 12 point review of systems was performed and was unremarkable.  Patient Active Problem List   Diagnosis Date Noted  . Nursing home resident 10/31/2013  .  S/P BKA (below knee amputation) unilateral 10/26/2013  . Shoulder mass 10/07/2011  . Keratosis of plantar aspect of foot 12/23/2010  . BACK PAIN 12/13/2008  . BUNIONS, BILATERAL 08/06/2008  . HYPERCHOLESTEROLEMIA 11/03/2006  . DEMENTIA, NOT SPECIFIED 11/03/2006  . ALCOHOL ABUSE, UNSPECIFIED 11/03/2006  . CATARACT 11/03/2006  . HEARING LOSS NOS OR DEAFNESS 11/03/2006  . HYPERTENSION, BENIGN SYSTEMIC 11/03/2006  . CVA 11/03/2006  . GASTROESOPHAGEAL REFLUX, NO ESOPHAGITIS 11/03/2006  .  CONSTIPATION 11/03/2006  . CONVULSIONS, SEIZURES, NOS 11/03/2006  . INCONTINENCE, URGE 11/03/2006   Past Medical History: Past Medical History  Diagnosis Date  . Hypertension   . Dementia   . Hyperlipidemia   . Seizures   . Metatarsal bone fracture     Right - base of 5th  . DVT (deep venous thrombosis) 02/22/2013    02/18/2013: Left lower extremity (femoral and popliteal) subacute Treated with coumadin x 6-7 months    . Stroke   . Trigeminal neuralgia 11/03/2006    Qualifier: Diagnosis of  By: Abundio Miu    . CATARACT 11/03/2006    Qualifier: Diagnosis of  By: Abundio Miu    . HEARING LOSS NOS OR DEAFNESS 11/03/2006    Qualifier: Diagnosis of  By: Abundio Miu    . INCONTINENCE, URGE 11/03/2006    Qualifier: Diagnosis of  By: Abundio Miu     Past Surgical History: Past Surgical History  Procedure Laterality Date  . Toe surgery      right 5th toe amputation  . Amputation Right 10/26/2013    Procedure: AMPUTATION BELOW KNEE;  Surgeon: Nadara Mustard, MD;  Location: [facility] OR;  Service: Orthopedics;  Laterality: Right;  Right Below Knee Amputation   Social History: History  Substance Use Topics  . Smoking status: Never Smoker   . Smokeless tobacco: Never Used  . Alcohol Use: No   Additional social history: none   Please also refer to relevant sections of EMR.  Family History: Family History  Problem Relation Age of Onset  . Hypertension Mother   . Arthritis Father   . Heart disease Daughter     Heart Disease before age 46  . Hyperlipidemia Daughter   . Hypertension Daughter    Allergies and Medications: No Known Allergies No current facility-administered medications on file prior to encounter.   Current Outpatient Prescriptions on File Prior to Encounter  Medication Sig Dispense Refill  . amLODipine (NORVASC) 10 MG tablet Take 10 mg by mouth daily.      Marland Kitchen atorvastatin (LIPITOR) 20 MG tablet Take 1 tablet (20 mg total) by mouth daily.  30 tablet  5   . hydrochlorothiazide (HYDRODIURIL) 25 MG tablet Take 25 mg by mouth daily.      . memantine (NAMENDA) 10 MG tablet Take 1 tablet (10 mg total) by mouth 2 (two) times daily.  180 tablet  1  . metoprolol succinate (TOPROL-XL) 50 MG 24 hr tablet Take 50 mg by mouth 2 (two) times daily. Take with or immediately following a meal.        Objective: BP 99/75  Pulse 99  Temp(Src) 99.9 F (37.7 C) (Rectal)  Resp 22  SpO2 100% Exam: General: NAD, [place] female, Elderly, frail  HEENT: left TM clear and intact, right TM unable to be visualized secondary to cerumen, [place]/AT, tacky mucous membranes, PERRL,  Cardiovascular: irregular rhythm, regular rate, no murmurs, clicks or gallops  Respiratory: CTAB, no wheezes, crackles, or rhonchi Abdomen: soft, NTND, +BS, no HSM Extremities: R BKA:  postop incision clean dry and intact, no drainage, no warmth or erythema. +2 pulses in b/l upper extremities and +1 left lower extremity, bunion on left foot.   Skin: no rashes  Neuro: not following commands but does respond slightly upon sternal rub.  Back: No sacral decubitus ulcers or skin breakdown  Labs and Imaging: CBC BMET   Recent Labs Lab 11/18/13 2055 11/18/13 2110  WBC 8.0  --   HGB 11.5* 12.6  HCT 34.8* 37.0  PLT 201  --     Recent Labs Lab 11/18/13 2110  NA 143  K 3.8  CL 103  BUN 35*  CREATININE 1.10  GLUCOSE 110*     Urinalysis    Component Value Date/Time   COLORURINE ORANGE* 11/18/2013 2018   APPEARANCEUR CLOUDY* 11/18/2013 2018   LABSPEC 1.031* 11/18/2013 2018   PHURINE 5.0 11/18/2013 2018   GLUCOSEU NEGATIVE 11/18/2013 2018   HGBUR TRACE* 11/18/2013 2018   BILIRUBINUR MODERATE* 11/18/2013 2018   BILIRUBINUR NEG 09/22/2012 1104   KETONESUR 15* 11/18/2013 2018   PROTEINUR 100* 11/18/2013 2018   UROBILINOGEN 2.0* 11/18/2013 2018   UROBILINOGEN 1.0 09/22/2012 1104   NITRITE NEGATIVE 11/18/2013 2018   NITRITE POSITIVE 09/22/2012 1104   LEUKOCYTESUR NEGATIVE 11/18/2013 2018     CXR: IMPRESSION:  1. Stable cardiomegaly with tortuous aorta and great vessels.  2. Stable basilar atelectasis and/or scarring.   CT right hip w/out contrast 1. No acute abnormality of the right hip. Mild arthritic changes.  2. No severe spinal stenosis at L4-5 and L5-S1 with right foraminal stenosis of both levels.   Myra Rude, MD 11/18/2013, 11:11 PM PGY-1 , [place] [facility] FPTS Intern pager: [phone], text pages welcome   Upper Level Addendum:  I have seen and evaluated this patient along with Dr. Jordan Likes and reviewed the above note, making necessary revisions in pink.   Levert Feinstein, MD Family Medicine PGY-2

## 2013-11-18 NOTE — ED Notes (Signed)
Pt was taken to CT

## 2013-11-18 NOTE — ED Notes (Addendum)
Pt. Non-verbal. Found on floor by nursing home, sent here for CT of right hip. Pt. Moans with pain with palpation of bilateral legs.

## 2013-11-18 NOTE — Telephone Encounter (Signed)
Received call around 1 AM from nursing staff at [facility] that pt was having pain in her L hip and was unable to have her leg externally rotated, with the leg being maintained in internal rotation.  Nursing wanted a B/L hip with pelvis and L femur/tibia/fibula despite no reported injury.  I acquiesced her request and signed the information out to Dr. Pollie Meyer.  Twana First Paulina Fusi, DO of [facility] 11/18/2013, 2:47 PM

## 2013-11-18 NOTE — Telephone Encounter (Signed)
Family Practice Teaching Service Interval Progress Note  Late entry note: Around 4pm received a call from [facility] that pt had fallen a second time today. Previously ordered xrays were done after second fall. [facility] staff noted pt's hx of seizure disorder and requested physician come evaluate her. Went to see pt in NH. Pt's daughter [name] at bedside, who stated pt is typically postictal/sleepy after seizure events. Per NH staff, pt was alert after each fall today.  VSS Gen: NAD, sleeping in bed, awakens to voice & touch, minimally verbal HEENT: NCAT, PERRL Heart: RRR, no mrumurs Lungs: CTAB via anterior auscultation Abd: soft, nontender to palpation, +BS Ext: s/p R BKA. R hip somewhat internally rotated. No TTP over back or bilateral hips and legs. No obvious deformity, swelling, or erythema. Pt cries out in pain with rotation of either hip. Exam difficult due to contracted position and pain. Neuro: minimally verbal, moves all extremities spontaneously  Xrays which were done after second fall show no acute abnormality, just chronic changes of arthritis. Discussed case with attending Dr. Jennette Kettle by phone, who recommended transfer of pt to ER for evaluation with CT of R hip to rule out occult fracture. Informed [facility] staff of this plan, who will coordinate to have her sent to ER. Also ordered fall precautions and q2h neuro checks until tomorrow morning.  Levert Feinstein, MD Family Medicine PGY-2 Service Pager [phone]

## 2013-11-18 NOTE — ED Provider Notes (Signed)
Medical screening examination/treatment/procedure(s) were conducted as a shared visit with non-physician practitioner(s) and myself.  I personally evaluated the patient during the encounter.   EKG Interpretation None      Pt with fever, unclear etiology, no signs of sepsis or meningitis, will ask for [facility] input on inpatient vs outpatient management.   Charles B. Bernette Mayers, MD 11/18/13 [phone]

## 2013-11-18 NOTE — Telephone Encounter (Signed)
Emergency Line / After Hours Call  Received call from [facility] that pt fell onto her bottom between 11-12 this morning. She is now complaining of pain in her bilateral hips. She did not hit her head. Was on the floor for about 5 minutes total before someone found her. Nurse denies that pt has any deformities of her lower extremities. Pt is able to move her legs, is just complaining of pain in her hips. She had xrays overnight (prior to the fall) that reportedly just showed arthritis. At baseline pt is not able to walk. She has had a prior amputation of her left leg. She complains of pain in her hips, not in knees/ankles.  Gave verbal order for xrays of bilateral hips and femurs now. Asked that [facility] staff call me back if they are abnormal.  Levert Feinstein, MD [facility] Medicine PGY-2

## 2013-11-18 NOTE — ED Notes (Signed)
Per EMS, nursing home staff reports unwitnessed fall either today or last night (unsure of when). Pt. Was found on floor. Has had xrays of right hip, sent for CT of hip. Pt. Nonverbal but moans occasionally. Pt. Alert. No bruising noted

## 2013-11-19 ENCOUNTER — Emergency Department (HOSPITAL_COMMUNITY): Payer: Medicare Other

## 2013-11-19 DIAGNOSIS — R509 Fever, unspecified: Secondary | ICD-10-CM

## 2013-11-19 DIAGNOSIS — M25559 Pain in unspecified hip: Secondary | ICD-10-CM

## 2013-11-19 DIAGNOSIS — M25459 Effusion, unspecified hip: Secondary | ICD-10-CM

## 2013-11-19 DIAGNOSIS — R4182 Altered mental status, unspecified: Secondary | ICD-10-CM | POA: Diagnosis present

## 2013-11-19 LAB — BASIC METABOLIC PANEL
BUN: 32 mg/dL — ABNORMAL HIGH (ref 6–23)
CO2: 25 mEq/L (ref 19–32)
Calcium: 8.8 mg/dL (ref 8.4–10.5)
Chloride: 107 mEq/L (ref 96–112)
Creatinine, Ser: 0.91 mg/dL (ref 0.50–1.10)
GFR, EST AFRICAN AMERICAN: 66 mL/min — AB (ref >90)
GFR, EST NON AFRICAN AMERICAN: 57 mL/min — AB (ref >90)
GLUCOSE: 96 mg/dL (ref 70–99)
POTASSIUM: 4.3 meq/L (ref 3.7–5.3)
SODIUM: 146 meq/L (ref 137–147)

## 2013-11-19 LAB — CBC
HCT: 33.4 % — ABNORMAL LOW (ref 36.0–46.0)
HEMOGLOBIN: 11.1 g/dL — AB (ref 12.0–15.0)
MCH: 29.3 pg (ref 26.0–34.0)
MCHC: 33.2 g/dL (ref 30.0–36.0)
MCV: 88.1 fL (ref 78.0–100.0)
Platelets: 172 10*3/uL (ref 150–400)
RBC: 3.79 MIL/uL — AB (ref 3.87–5.11)
RDW: 14.1 % (ref 11.5–15.5)
WBC: 7 10*3/uL (ref 4.0–10.5)

## 2013-11-19 LAB — URINE CULTURE
CULTURE: NO GROWTH
Colony Count: NO GROWTH

## 2013-11-19 LAB — MRSA PCR SCREENING: MRSA BY PCR: NEGATIVE

## 2013-11-19 MED ORDER — CARBAMAZEPINE 100 MG/5ML PO SUSP
150.0000 mg | Freq: Four times a day (QID) | ORAL | Status: DC
  Administered 2013-11-19 – 2013-11-23 (×16): 150 mg via ORAL
  Filled 2013-11-19 (×19): qty 10

## 2013-11-19 MED ORDER — METOPROLOL SUCCINATE ER 50 MG PO TB24
50.0000 mg | ORAL_TABLET | Freq: Two times a day (BID) | ORAL | Status: DC
  Administered 2013-11-19: 50 mg via ORAL
  Filled 2013-11-19 (×3): qty 1

## 2013-11-19 MED ORDER — METOPROLOL TARTRATE 1 MG/ML IV SOLN
2.5000 mg | Freq: Three times a day (TID) | INTRAVENOUS | Status: DC
  Administered 2013-11-19 – 2013-11-21 (×6): 2.5 mg via INTRAVENOUS
  Filled 2013-11-19 (×8): qty 5

## 2013-11-19 MED ORDER — BISACODYL 10 MG RE SUPP: 10.0000 mg | Freq: Every day | RECTAL | Status: DC | PRN

## 2013-11-19 MED ORDER — MEMANTINE HCL 10 MG PO TABS
10.0000 mg | ORAL_TABLET | Freq: Two times a day (BID) | ORAL | Status: DC
  Administered 2013-11-19 – 2013-11-23 (×7): 10 mg via ORAL
  Filled 2013-11-19 (×10): qty 1

## 2013-11-19 MED ORDER — CARBAMAZEPINE ER 100 MG PO TB12
300.0000 mg | ORAL_TABLET | Freq: Two times a day (BID) | ORAL | Status: DC
  Filled 2013-11-19 (×2): qty 3

## 2013-11-19 MED ORDER — ACETAMINOPHEN 325 MG PO TABS: 650.0000 mg | ORAL_TABLET | Freq: Four times a day (QID) | ORAL | Status: DC | PRN

## 2013-11-19 MED ORDER — ATORVASTATIN CALCIUM 20 MG PO TABS
20.0000 mg | ORAL_TABLET | Freq: Every day | ORAL | Status: DC
  Administered 2013-11-21 – 2013-11-23 (×3): 20 mg via ORAL
  Filled 2013-11-19 (×5): qty 1

## 2013-11-19 MED ORDER — SODIUM CHLORIDE 0.9 % IJ SOLN
3.0000 mL | Freq: Two times a day (BID) | INTRAMUSCULAR | Status: DC
  Administered 2013-11-19 – 2013-11-23 (×4): 3 mL via INTRAVENOUS

## 2013-11-19 MED ORDER — AMLODIPINE BESYLATE 10 MG PO TABS
10.0000 mg | ORAL_TABLET | Freq: Every day | ORAL | Status: DC
  Administered 2013-11-21: 10 mg via ORAL
  Filled 2013-11-19 (×3): qty 1

## 2013-11-19 MED ORDER — SODIUM CHLORIDE 0.9 % IV SOLN
INTRAVENOUS | Status: DC
  Administered 2013-11-19 – 2013-11-22 (×4): via INTRAVENOUS

## 2013-11-19 MED ORDER — ACETAMINOPHEN 650 MG RE SUPP: 650.0000 mg | Freq: Four times a day (QID) | RECTAL | Status: DC | PRN

## 2013-11-19 MED ORDER — HYDRALAZINE HCL 20 MG/ML IJ SOLN: 5.0000 mg | INTRAMUSCULAR | Status: DC | PRN

## 2013-11-19 MED ORDER — ASPIRIN 81 MG PO CHEW
81.0000 mg | CHEWABLE_TABLET | Freq: Every day | ORAL | Status: DC
  Administered 2013-11-21: 81 mg via ORAL
  Filled 2013-11-19 (×3): qty 1

## 2013-11-19 MED ORDER — HYDROCHLOROTHIAZIDE 25 MG PO TABS
25.0000 mg | ORAL_TABLET | Freq: Every day | ORAL | Status: DC
  Administered 2013-11-21: 25 mg via ORAL
  Filled 2013-11-19 (×3): qty 1

## 2013-11-19 NOTE — Consult Note (Signed)
RI have reviewed the patient's current medications.eason for Consult: Altered mental status rule out sepsis Referring Physician: Dr Deforest Hoyles Ann [name] is an 80 y.o. female.  HPI: Patient is an 80 year old woman who is status post right transtibial amputation. Patient has developed increasing mental status changes evaluate to rule out sepsis.  Past Medical History  Diagnosis Date  . Hypertension   . Dementia   . Hyperlipidemia   . Seizures   . Metatarsal bone fracture     Right - base of 5th  . DVT (deep venous thrombosis) 02/22/2013    02/18/2013: Left lower extremity (femoral and popliteal) subacute Treated with coumadin x 6-7 months    . Stroke   . Trigeminal neuralgia 11/03/2006    Qualifier: Diagnosis of  By: Eusebio Friendly    . CATARACT 11/03/2006    Qualifier: Diagnosis of  By: Eusebio Friendly    . HEARING LOSS NOS OR DEAFNESS 11/03/2006    Qualifier: Diagnosis of  By: Eusebio Friendly    . INCONTINENCE, URGE 11/03/2006    Qualifier: Diagnosis of  By: Eusebio Friendly      Past Surgical History  Procedure Laterality Date  . Toe surgery      right 5th toe amputation  . Amputation Right 10/26/2013    Procedure: AMPUTATION BELOW KNEE;  Surgeon: Newt Minion, MD;  Location: [place];  Service: Orthopedics;  Laterality: Right;  Right Below Knee Amputation    Family History  Problem Relation Age of Onset  . Hypertension Mother   . Arthritis Father   . Heart disease Daughter     Heart Disease before age 23  . Hyperlipidemia Daughter   . Hypertension Daughter     Social History:  reports that she has never smoked. She has never used smokeless tobacco. She reports that she does not drink alcohol or use illicit drugs.  Allergies: No Known Allergies  Medications: I have reviewed the patient's current medications.  Results for orders placed during the hospital encounter of 11/18/13 (from the past 48 hour(s))  URINALYSIS, ROUTINE W REFLEX MICROSCOPIC     Status: Abnormal    Collection Time    11/18/13  8:18 PM      Result Value Ref Range   Color, Urine ORANGE (*) YELLOW   Comment: BIOCHEMICALS MAY BE AFFECTED BY COLOR   APPearance CLOUDY (*) CLEAR   Specific Gravity, Urine 1.031 (*) 1.005 - 1.030   pH 5.0  5.0 - 8.0   Glucose, UA NEGATIVE  NEGATIVE mg/dL   Hgb urine dipstick TRACE (*) NEGATIVE   Bilirubin Urine MODERATE (*) NEGATIVE   Ketones, ur 15 (*) NEGATIVE mg/dL   Protein, ur 100 (*) NEGATIVE mg/dL   Urobilinogen, UA 2.0 (*) 0.0 - 1.0 mg/dL   Nitrite NEGATIVE  NEGATIVE   Leukocytes, UA NEGATIVE  NEGATIVE  URINE MICROSCOPIC-ADD ON     Status: Abnormal   Collection Time    11/18/13  8:18 PM      Result Value Ref Range   Squamous Epithelial / LPF FEW (*) RARE   RBC / HPF 0-2  <3 RBC/hpf   Casts GRANULAR CAST (*) NEGATIVE   Urine-Other AMORPHOUS URATES/PHOSPHATES     Comment: MUCOUS PRESENT  CBC WITH DIFFERENTIAL     Status: Abnormal   Collection Time    11/18/13  8:55 PM      Result Value Ref Range   WBC 8.0  4.0 - 10.5 K/uL   RBC 4.02  3.87 -  5.11 MIL/uL   Hemoglobin 11.5 (*) 12.0 - 15.0 g/dL   HCT 34.8 (*) 36.0 - 46.0 %   MCV 86.6  78.0 - 100.0 fL   MCH 28.6  26.0 - 34.0 pg   MCHC 33.0  30.0 - 36.0 g/dL   RDW 13.9  11.5 - 15.5 %   Platelets 201  150 - 400 K/uL   Neutrophils Relative % 79 (*) 43 - 77 %   Neutro Abs 6.3  1.7 - 7.7 K/uL   Lymphocytes Relative 9 (*) 12 - 46 %   Lymphs Abs 0.7  0.7 - 4.0 K/uL   Monocytes Relative 11  3 - 12 %   Monocytes Absolute 0.9  0.1 - 1.0 K/uL   Eosinophils Relative 0  0 - 5 %   Eosinophils Absolute 0.0  0.0 - 0.7 K/uL   Basophils Relative 0  0 - 1 %   Basophils Absolute 0.0  0.0 - 0.1 K/uL  I-STAT CHEM 8, ED     Status: Abnormal   Collection Time    11/18/13  9:10 PM      Result Value Ref Range   Sodium 143  137 - 147 mEq/L   Potassium 3.8  3.7 - 5.3 mEq/L   Chloride 103  96 - 112 mEq/L   BUN 35 (*) 6 - 23 mg/dL   Creatinine, Ser 1.10  0.50 - 1.10 mg/dL   Glucose, Bld 110 (*) 70 - 99  mg/dL   Calcium, Ion 1.12 (*) 1.13 - 1.30 mmol/L   TCO2 27  0 - 100 mmol/L   Hemoglobin 12.6  12.0 - 15.0 g/dL   HCT 37.0  36.0 - 46.0 %  MRSA PCR SCREENING     Status: None   Collection Time    11/19/13  4:30 AM      Result Value Ref Range   MRSA by PCR NEGATIVE  NEGATIVE   Comment:            The GeneXpert MRSA Assay (FDA     approved for NASAL specimens     only), is one component of a     comprehensive MRSA colonization     surveillance program. It is not     intended to diagnose MRSA     infection nor to guide or     monitor treatment for     MRSA infections.  BASIC METABOLIC PANEL     Status: Abnormal   Collection Time    11/19/13  5:30 AM      Result Value Ref Range   Sodium 146  137 - 147 mEq/L   Potassium 4.3  3.7 - 5.3 mEq/L   Chloride 107  96 - 112 mEq/L   CO2 25  19 - 32 mEq/L   Glucose, Bld 96  70 - 99 mg/dL   BUN 32 (*) 6 - 23 mg/dL   Creatinine, Ser 0.91  0.50 - 1.10 mg/dL   Calcium 8.8  8.4 - 10.5 mg/dL   GFR calc non Af Amer 57 (*) >90 mL/min   GFR calc Af Amer 66 (*) >90 mL/min   Comment: (NOTE)     The eGFR has been calculated using the CKD EPI equation.     This calculation has not been validated in all clinical situations.     eGFR's persistently <90 mL/min signify possible Chronic Kidney     Disease.  CBC     Status: Abnormal   Collection Time  11/19/13  5:30 AM      Result Value Ref Range   WBC 7.0  4.0 - 10.5 K/uL   RBC 3.79 (*) 3.87 - 5.11 MIL/uL   Hemoglobin 11.1 (*) 12.0 - 15.0 g/dL   HCT 33.4 (*) 36.0 - 46.0 %   MCV 88.1  78.0 - 100.0 fL   MCH 29.3  26.0 - 34.0 pg   MCHC 33.2  30.0 - 36.0 g/dL   RDW 14.1  11.5 - 15.5 %   Platelets 172  150 - 400 K/uL    Dg Chest 2 View  11/18/2013   CLINICAL DATA:  Cough.  EXAM: CHEST  2 VIEW  COMPARISON:  DG CHEST 2 VIEW dated 02/18/2013; DG CHEST 1 VIEW dated 08/27/2010; DG CHEST 2 VIEW dated 05/25/2010  FINDINGS: Mediastinum and hilar structures are stable. Thoracic aorta and great vessels are  tortuous. Stable cardiomegaly. Normal pulmonary vascularity. No pleural effusion or pneumothorax. Basilar atelectasis and/or scarring. Old right rib fractures are present.  IMPRESSION: 1. Stable cardiomegaly with tortuous aorta and great vessels. 2. Stable basilar atelectasis and/or scarring.   Electronically Signed   By: Marcello Moores  Register   On: 11/18/2013 20:56   Ct Head Wo Contrast  11/19/2013   CLINICAL DATA:  Fall, altered mental status  EXAM: CT HEAD WITHOUT CONTRAST  TECHNIQUE: Contiguous axial images were obtained from the base of the skull through the vertex without intravenous contrast.  COMPARISON:  Prior MRI from 08/25/2012  FINDINGS: Atrophy with moderate chronic microvascular ischemic disease is present. Encephalomalacia within the right frontotemporal region compatible with remote infarct. Remote lacunar infarct noted within the in left basal ganglia.  There is no acute intracranial hemorrhage or infarct. No mass lesion or midline shift. Gray-white matter differentiation is well maintained. Ventricles are normal in size without evidence of hydrocephalus. CSF containing spaces are within normal limits. No extra-axial fluid collection.  Sequelae of prior bi-frontotemporal craniectomy noted. Calvarium otherwise intact.  Orbital soft tissues are within normal limits.  Mild mucoperiosteal thickening present within the partially visualized left maxillary sinus. Paranasal sinuses are otherwise clear. No mastoid effusion. Paranasal sinuses and mastoid air cells are well pneumatized and free of fluid.  Scalp soft tissues are unremarkable.  IMPRESSION: 1. No acute intracranial process identified. 2. Remote right MCA territory infarct. 3. Moderate atrophy and chronic microvascular ischemic disease.   Electronically Signed   By: Jeannine Boga M.D.   On: 11/19/2013 01:23   Ct Hip Right Wo Contrast  11/19/2013   ADDENDUM REPORT: 11/19/2013 12:28  ADDENDUM: Correction: IMPRESSION #2 should read: "Severe  spinal stenosis at L4-5 and L5-S1 with right foraminal stenosis of both levels."   Electronically Signed   By: Rozetta Nunnery M.D.   On: 11/19/2013 12:28   11/19/2013   CLINICAL DATA:  Right hip pain after a fall.  EXAM: CT OF THE RIGHT HIP WITHOUT CONTRAST  TECHNIQUE: Multidetector CT imaging was performed according to the standard protocol. Multiplanar CT image reconstructions were also generated.  COMPARISON:  None.  FINDINGS: There is no fracture or dislocation. There is slight chondrocalcinosis of the right hip with small osteophytes on the posterior inferior aspect of the right acetabulum. There is suggestion of a small hip effusion.  The visualized pelvic bones are intact. Right sacroiliac joint is fused. The patient has severe spinal stenosis at L4-5 and at L5-S1 with right foraminal stenosis at both levels. Soft tissues around the hip appear normal.  IMPRESSION: 1. No acute abnormality of the  right hip.  Mild arthritic changes. 2. No severe spinal stenosis at L4-5 and L5-S1 with right foraminal stenosis of both levels.  Electronically Signed: By: Rozetta Nunnery M.D. On: 11/18/2013 20:14    Review of Systems  All other systems reviewed and are negative.   Blood pressure 115/86, pulse 95, temperature 98.3 F (36.8 C), temperature source Oral, resp. rate 23, height $RemoveBe'5\' 1"'FfzPOhPvR$  (1.549 m), weight 63.5 kg (139 lb 15.9 oz), SpO2 97.00%. Physical Exam On examination patient's left leg and foot shows no redness no cellulitis no signs of infection there is no open wounds. Examination of right lower extremity patient has a well-healed transtibial amputation there is no fluctuance no drainage no cellulitis no signs of infection. Assessment/Plan: Assessment: Well-healing right transtibial amputation with no signs of abscess sepsis or drainage.  Plan: I will followup as an outpatient to followup in the transtibial amputation. Patient can use an Ace wrap for compression.  Zaiyah Sottile V 11/19/2013, 6:36 PM

## 2013-11-19 NOTE — Progress Notes (Signed)
FMTS Attending Note  The plan of care was discussed with the resident team. I agree with the assessment and plan as documented by the resident.   Zafira Munos MD 

## 2013-11-19 NOTE — Progress Notes (Signed)
New Admission Note: (late entry)  Arrival Method: Stretcher via ED with EMT Francisco Capuchin Mental Orientation: Alert to self. Very verbal with screaming and yelling at caregivers  Telemetry: Placed on box 6E25 Assessment: Completed Skin: Right BKA(41 weeks old, est.) Left foot/ankle discoloration  IV: Clean, dry and intact. NS @ 56mL/hr Pain: None, does not understand my question. Only yells when touched (fear related) Tubes: None Safety Measures: Safety Fall Prevention Plan has been given, discussed and signed by nurse only. Patient not able. Admission: Completed [address] [place] Orientation: Patient has been orientated to the room, unit and staff. Needs reinforcement  Family: None at the bedside  Orders have been reviewed and implemented. Will continue to monitor the patient. Call light has been placed within reach and bed alarm has been activated.   [employer], RN3 Phone number: [phone]

## 2013-11-19 NOTE — ED Notes (Signed)
CT notified that the pt is ready for her scan.  

## 2013-11-19 NOTE — H&P (Signed)
FMTS Attending Admission Note: Denny Levy MD [phone] pager office [phone] I  have seen and examined this patient, reviewed their chart. I have discussed this patient with the resident. I agree with the resident's findings, assessment and care plan. After two falls at [facility] with negative x rays Dr Pollie Meyer went to NH to evaluate her and found her to have pain with R hip exam. We discussed and I recommended tnsh to ED for hip CT as I was concerned about occult fracture (especially since we could not view the actual films--only got report). Hip Ct negative for fracture, does reveal small effusion. She spiked a fever while in ED so we opted   To admit for obs at [facility], consult ortho today, follow for further evidence of fever.

## 2013-11-19 NOTE — Clinical Social Work Note (Addendum)
CSW received call from patient's granddaughter Leyah Stickles [phone]) regarding home health services. Per granddaughter the SW at the skilled facility where patient was placed for ST rehab SNF had not set up home health services, and they (patient's granddaughter & daughter) want to ensure these services are put in place. CSW informed Ms. [name] that this information will be passed on to the nurse case manager and they will follow-up with patient's daughter Auset Fierst [phone] and work #[phone] Leeroy Cha). Per [name], her mother is the primary contact.  CSW contacted Bjorn Loser at [place] regarding home health services. Per Bjorn Loser, the plan was for patient to discharge from [facility] today, 3/16 and [facility] services would have been set-up today.  Genelle Bal, MSW, LCSW [phone]

## 2013-11-19 NOTE — Progress Notes (Signed)
Attempted to get report from ED RN. As report was being discussed the MD was calling the ED RN. She stated that she would call back when she got off the phone.   [employer], RN 3  [facility] [place] [phone]

## 2013-11-19 NOTE — Progress Notes (Signed)
Family Medicine Teaching Service Daily Progress Note Intern Pager: [phone]  Patient name: Ann NephewDaisy M [name] Medical record number: [id number] Date of birth: 1927-10-03 Age: 80 y.o. Gender: female  Primary Care Provider: Rodman PickleHAIRFORD, AMBER, MD Consultants: none Code Status: full  Pt Overview and Major Events to Date:  3/15 - admitted with fever, AMS  Assessment and Plan: Ann NephewDaisy M [name] is a 80 y.o. female presenting with fever, hip pain, and altered mental status . PMH is significant for dementia, seizures, CVA and remote alcohol abuse seen following [facility] following hospitalization from 2/20-2/23/15 for Gangrenous R foot requiring R BKA, hx of DVT (not on anticoagulation), PAD, no known CAD or CHF.   #Altered Mental Status/fever of unknown origin: Daughter reports that her mother has not been acting herself since Friday. At baseline she is able to sit up and watch TV. Patient does not follow commands on exam. CBC and CMP are normal with a UA and CXR showing no signs of infection. No meningeal signs on exam. Hemodynamically stable and not meeting SIRS criteria. Will place in observation and monitor for recurrence of fever. Suspect this is related to viral syndrome on top of dementia/delirium.  - Head CT showed remote infarct, no acute findings - if febrile again, will start Vanc/Zosyn  - f/u blood cx  - f/u urine cx  - am labs stable/unremarkable   #Recent fall: CT of right hip was negative for fracture. Patient didn't hit her head. She is currently undergoing [facility].  - strict bed rest  - hip effusion on CT - pain with movement at that joint - given concurrent fever, ortho consult for possible tap   #PAD s/p R BKA: Currently residing in nursing home for acute rehab following the surgery. Surgery occurred due to gangrene of the foot with known non-interventional peripheral vascular disease by Dr. Lajoyce Cornersuda.  - lipitor 20 mg  - hold home  tramadol due to hx of seizures   #Dementia: On review of chart there is no documented MMSE. Currently on namenda.  - Namenda 10 mg BID   #HTN: currently well controlled. Continue home medications.  - amlodipine 10 mg daily  - metoprolol 50 mg BID  - HCTZ 25 mg daily   #Seizures: In reviewing chart, last known seizure was 05/2013. Last carbamazepine level in EPIC was 7.1 on 02/19/13.  - CT head as above  - Seizure precautions.  - Carbamezepine 300 mg BID   FEN/GI: NS 50 mL/hr  Prophylaxis: SCD's   Disposition: home pending clearing of mental status, likely later today  Subjective: Denies pain, not responding to most question  Objective: Temp:  [98.3 F (36.8 C)-101.2 F (38.4 C)] 98.3 F (36.8 C) (03/16 0605) Pulse Rate:  [22-103] 94 (03/16 0605) Resp:  [17-26] 20 (03/16 0605) BP: (94-115)/(67-87) 112/75 mmHg (03/16 0605) SpO2:  [93 %-100 %] 93 % (03/16 0605) Weight:  [139 lb 15.9 oz (63.5 kg)] 139 lb 15.9 oz (63.5 kg) (03/16 0400) Physical Exam: General: NAD, African [place] female, Elderly, frail  HEENT: [place]/AT, tacky mucous membranes Cardiovascular: irregular rhythm, normal rate, no murmurs, clicks or gallops  Respiratory: CTAB, no wheezes, crackles, or rhonchi  Abdomen: soft, NTND, +BS, no HSM  Extremities: R BKA: postop incision clean dry and intact, no drainage, no warmth or erythema. Skin: no rashes  Neuro: opens eyes on command and speaks incoherently, does not respond to most questions but does deny pain  Laboratory:  Recent Labs Lab  11/18/13 2055 11/18/13 2110 11/19/13 0530  WBC 8.0  --  7.0  HGB 11.5* 12.6 11.1*  HCT 34.8* 37.0 33.4*  PLT 201  --  172    Recent Labs Lab 11/18/13 2110 11/19/13 0530  NA 143 146  K 3.8 4.3  CL 103 107  CO2  --  25  BUN 35* 32*  CREATININE 1.10 0.91  CALCIUM  --  8.8  GLUCOSE 110* 96   Imaging/Diagnostic Tests: CXR:  1. Stable cardiomegaly with tortuous aorta and great vessels.  2. Stable basilar atelectasis  and/or scarring.   CT right hip w/out contrast:  1. No acute abnormality of the right hip. Mild arthritic changes.  2. Severe spinal stenosis at L4-5 and L5-S1 with right foraminal stenosis of both levels.  CT head: 1. No acute intracranial process identified.  2. Remote right MCA territory infarct.  3. Moderate atrophy and chronic microvascular ischemic disease.  Beverely Low, MD 11/19/2013, 8:21 AM PGY-1, [facility] Family Medicine FPTS Intern pager: [phone], text pages welcome

## 2013-11-19 NOTE — Progress Notes (Signed)
UR completed 

## 2013-11-20 ENCOUNTER — Observation Stay (HOSPITAL_COMMUNITY): Payer: Medicare Other

## 2013-11-20 ENCOUNTER — Encounter: Payer: Self-pay | Admitting: Family Medicine

## 2013-11-20 ENCOUNTER — Telehealth: Payer: Self-pay | Admitting: Family Medicine

## 2013-11-20 DIAGNOSIS — E440 Moderate protein-calorie malnutrition: Secondary | ICD-10-CM | POA: Diagnosis present

## 2013-11-20 DIAGNOSIS — R4182 Altered mental status, unspecified: Secondary | ICD-10-CM

## 2013-11-20 DIAGNOSIS — I10 Essential (primary) hypertension: Secondary | ICD-10-CM

## 2013-11-20 LAB — CBC
HCT: 32.3 % — ABNORMAL LOW (ref 36.0–46.0)
HEMOGLOBIN: 10.4 g/dL — AB (ref 12.0–15.0)
MCH: 28.3 pg (ref 26.0–34.0)
MCHC: 32.2 g/dL (ref 30.0–36.0)
MCV: 88 fL (ref 78.0–100.0)
Platelets: 166 10*3/uL (ref 150–400)
RBC: 3.67 MIL/uL — AB (ref 3.87–5.11)
RDW: 14.1 % (ref 11.5–15.5)
WBC: 5.4 10*3/uL (ref 4.0–10.5)

## 2013-11-20 LAB — C-REACTIVE PROTEIN: CRP: 21.6 mg/dL — AB (ref <0.60)

## 2013-11-20 LAB — SEDIMENTATION RATE: SED RATE: 68 mm/h — AB (ref 0–22)

## 2013-11-20 MED ORDER — DOXYCYCLINE CALCIUM 50 MG/5ML PO SYRP: 100.0000 mg | ORAL_SOLUTION | Freq: Two times a day (BID) | ORAL | Status: DC

## 2013-11-20 MED ORDER — DOXYCYCLINE CALCIUM 50 MG/5ML PO SYRP
100.0000 mg | ORAL_SOLUTION | Freq: Two times a day (BID) | ORAL | Status: DC
  Administered 2013-11-20 – 2013-11-23 (×6): 100 mg via ORAL
  Filled 2013-11-20 (×10): qty 10

## 2013-11-20 MED ORDER — CARBAMAZEPINE 100 MG/5ML PO SUSP: 150.0000 mg | Freq: Four times a day (QID) | ORAL | Status: DC

## 2013-11-20 MED ORDER — DOXYCYCLINE HYCLATE 100 MG PO TABS: 100.0000 mg | ORAL_TABLET | Freq: Two times a day (BID) | ORAL | Status: DC

## 2013-11-20 NOTE — Progress Notes (Signed)
INITIAL NUTRITION ASSESSMENT  DOCUMENTATION CODES Per approved criteria  -Non-severe (moderate) malnutrition in the context of chronic illness   INTERVENTION: Diet texture and liquid consistency per SLP. Recommend Ensure Complete po BID, each supplement provides 350 kcal and 13 grams of protein, once diet order permits. Recommend 30 ml Prostat po BID, each supplement provides 100 kcal and 15 grams protein, once diet order permits. If aggressive nutrition interventions desired, i.e. Nutrition support, please consult RD. RD to continue to follow nutrition care plan.  NUTRITION DIAGNOSIS: Inadequate oral intake related to inability to eat as evidenced by NPO status.   Goal: Intake to meet >90% of estimated nutrition needs.  Monitor:  weight trends, lab trends, I/O's, PO intake, supplement tolerance  Reason for Assessment: MD Consult for Poor PO Intake.  80 y.o. female  Admitting Dx: Altered mental status  ASSESSMENT: PMHx significant for dementia, seizure, CVA, R BKA in Feb 2015 and remote ETOH abuse. Admitted with fever, hip pain and AMS. Work-up reveals likely worsening dementia.  Ortho MD evaluated recent BKA site and reported that it well-healing with no signs of abscess, sepsis, or drainage.  RD consulted for pt not eating since admission, however pt remains NPO since admit. SLP consult pending. Palliative consult pending at this time.  Pt with 7% weight loss since amputation, however this is consistent with approximated limb loss ~6.5% for BKA.  Nutrition Focused Physical Exam:  Subcutaneous Fat:  Orbital Region: WNL Upper Arm Region: WNL Thoracic and Lumbar Region: WNL  Muscle:  Temple Region: moderate depletion Clavicle Bone Region: moderate depletion Clavicle and Acromion Bone Region: moderate depletion Scapular Bone Region: n/a Dorsal Hand: n/a Patellar Region: n/a Anterior Thigh Region: n/a Posterior Calf Region: n/a  Edema: n/a  Pt meets criteria for  moderate MALNUTRITION in the context of chronic illness as evidenced by moderate muscle mass loss and suspected intake of <75% x at least 1 month.   Height: Ht Readings from Last 1 Encounters:  11/19/13 5\' 1"  (1.549 m)    Weight: Wt Readings from Last 1 Encounters:  11/19/13 139 lb 15.9 oz (63.5 kg)    Ideal Body Weight: 98 lb (adjusted for BKA)  % Ideal Body Weight: 142%  Wt Readings from Last 10 Encounters:  11/19/13 139 lb 15.9 oz (63.5 kg)  10/30/13 139 lb 6.4 oz (63.231 kg)  10/26/13 147 lb 0.8 oz (66.7 kg)  10/26/13 147 lb 0.8 oz (66.7 kg)  10/24/13 147 lb (66.679 kg)  10/19/13 151 lb (68.493 kg)  09/13/13 151 lb (68.493 kg)  09/13/13 151 lb (68.493 kg)  08/31/13 151 lb (68.493 kg)  08/06/13 152 lb (68.947 kg)    Usual Body Weight: 150 lb  % Usual Body Weight: 93%  BMI:  28.1 - adjusted for BKA; overweight  Estimated Nutritional Needs: Kcal: 1450 - 1600 Protein: 80 - 90 g Fluid: 1.5 - 1.8 liters daily  Skin: R leg incision  Diet Order: NPO  EDUCATION NEEDS: -No education needs identified at this time   Intake/Output Summary (Last 24 hours) at 11/20/13 1106 Last data filed at 11/19/13 2300  Gross per 24 hour  Intake    240 ml  Output      0 ml  Net    240 ml    Last BM: PTA  Labs:   Recent Labs Lab 11/18/13 2110 11/19/13 0530  NA 143 146  K 3.8 4.3  CL 103 107  CO2  --  25  BUN 35* 32*  CREATININE  1.10 0.91  CALCIUM  --  8.8  GLUCOSE 110* 96    CBG (last 3)  No results found for this basename: GLUCAP,  in the last 72 hours  Scheduled Meds: . amLODipine  10 mg Oral Daily  . aspirin  81 mg Oral Daily  . atorvastatin  20 mg Oral Daily  . carBAMazepine  150 mg Oral QID  . doxycycline  100 mg Oral BID  . hydrochlorothiazide  25 mg Oral Daily  . memantine  10 mg Oral BID  . metoprolol  2.5 mg Intravenous 3 times per day  . sodium chloride  3 mL Intravenous Q12H    Continuous Infusions: . sodium chloride 50 mL/hr at 11/19/13  2304    Past Medical History  Diagnosis Date  . Hypertension   . Dementia   . Hyperlipidemia   . Seizures   . Metatarsal bone fracture     Right - base of 5th  . DVT (deep venous thrombosis) 02/22/2013    02/18/2013: Left lower extremity (femoral and popliteal) subacute Treated with coumadin x 6-7 months    . Stroke   . Trigeminal neuralgia 11/03/2006    Qualifier: Diagnosis of  By: Abundio MiuMcGregor, Barbara    . CATARACT 11/03/2006    Qualifier: Diagnosis of  By: Abundio MiuMcGregor, Barbara    . HEARING LOSS NOS OR DEAFNESS 11/03/2006    Qualifier: Diagnosis of  By: Abundio MiuMcGregor, Barbara    . INCONTINENCE, URGE 11/03/2006    Qualifier: Diagnosis of  By: Abundio MiuMcGregor, Barbara      Past Surgical History  Procedure Laterality Date  . Toe surgery      right 5th toe amputation  . Amputation Right 10/26/2013    Procedure: AMPUTATION BELOW KNEE;  Surgeon: Nadara MustardMarcus V Duda, MD;  Location: [facility] OR;  Service: Orthopedics;  Laterality: Right;  Right Below Knee Amputation    Jarold MottoSamantha Manar Smalling MS, RD, LDN Inpatient Registered Dietitian Pager: [phone] After-hours pager: [phone]

## 2013-11-20 NOTE — Evaluation (Signed)
Physical Therapy Evaluation Patient Details Name: Ann [name] MRN: [id number] DOB: August 09, 1928 Today's Date: 11/20/2013 Time: 1350-1420 PT Time Calculation (min): 30 min  PT Assessment / Plan / Recommendation History of Present Illness  Ann [name] is a 80 y.o. female presenting with fever, hip pain, and altered mental status . PMH is significant for dementia, seizures, CVA and remote alcohol abuse seen following admission to [facility] following hospitalization from 2/20-2/23/15 for Gangrenous R foot requiring R BKA, hx of DVT (not on anticoagulation), PAD, no known CAD or CHF.Per chart, pt was preparing to d/c to home with HHPT just prior to admission  Clinical Impression  Pt is bery deconditioned.  She is unable to perform bed mobility on her own and is not able to stand to assist with pivot transfers. She will need significant assist of occasionally 2 people andwheelchair, lift , and  possible hospital bed and ambulance transport if  discharge is to home    PT Assessment  Patient needs continued PT services    Follow Up Recommendations  Supervision/Assistance - 24 hour;SNF (if pt d/d to home with need 24 hour care with occasional +2 assist)    Does the patient have the potential to tolerate intense rehabilitation      Barriers to Discharge Other (comment) (unknown level of assist or accessiblity)      Equipment Recommendations  Wheelchair (measurements PT)    Recommendations for Other Services OT consult   Frequency Min 3X/week    Precautions / Restrictions Precautions Precautions: Fall   Pertinent Vitals/Pain Pt with c/o generalized pain, incontinent of stool and urine      Mobility  Bed Mobility Overal bed mobility: +2 for physical assistance Bed Mobility: Supine to Sit;Sit to Supine Supine to sit: Max assist Sit to supine: Mod assist;+2 for physical assistance General bed mobility comments: pt needs assist to lift upper body into sitting and to lower  shoulders/lift legs back onto bed Transfers Overall transfer level: Needs assistance Equipment used: Rolling walker (2 wheeled) Transfers: Sit to/from Stand General transfer comment: Pt was unable to stand with walker despite multiple attempts Ambulation/Gait General Gait Details: unable to test    Exercises     PT Diagnosis: Difficulty walking;Generalized weakness;Acute pain  PT Problem List: Decreased activity tolerance;Decreased balance;Decreased mobility;Decreased safety awareness;Decreased knowledge of use of DME;Decreased knowledge of precautions;Pain;Decreased cognition PT Treatment Interventions: Therapeutic exercise;Therapeutic activities;Balance training;Functional mobility training;Patient/family education;Wheelchair mobility training     PT Goals(Current goals can be found in the care plan section) Acute Rehab PT Goals Patient Stated Goal: unable to state PT Goal Formulation: Patient unable to participate in goal setting Time For Goal Achievement: 12/04/13 Potential to Achieve Goals: Fair  Visit Information  Last PT Received On: 11/20/13 Assistance Needed: +2 History of Present Illness: Ann [name] is a 80 y.o. female presenting with fever, hip pain, and altered mental status . PMH is significant for dementia, seizures, CVA and remote alcohol abuse seen following admission to [facility] following hospitalization from 2/20-2/23/15 for Gangrenous R foot requiring R BKA, hx of DVT (not on anticoagulation), PAD, no known CAD or CHF.Per chart, pt was preparing to d/c to home with HHPT just prior to admission       Prior Functioning  Home Living Additional Comments: unknown. no family in room  discussed with case management who is in process of investigation Communication Communication: Other (comment) (limited communication, not able to consistently understand her)    Cognition  Cognition Arousal/Alertness:  Awake/alert Behavior During Therapy:  Anxious Overall Cognitive Status: No family/caregiver present to determine baseline cognitive functioning    Extremity/Trunk Assessment Lower Extremity Assessment Lower Extremity Assessment: RLE deficits/detail;Difficult to assess due to impaired cognition;LLE deficits/detail RLE Deficits / Details: BKA appears well shaped in ace wrap dressing.  She is able to lift leg against gravity, but appears to have muscle atrophy LLE Deficits / Details: generalized muscle atrophy.  Appears to have genu valgus Cervical / Trunk Assessment Cervical / Trunk Assessment: Other exceptions Cervical / Trunk Exceptions: generalized deconditioning   Balance Balance Overall balance assessment: Needs assistance Sitting-balance support: Feet supported;Bilateral upper extremity supported Sitting balance-Leahy Scale: Good Sitting balance - Comments: pt is able to sit on edge of bed, but is not reliable to be left alone due to impaired cognition General Comments General comments (skin integrity, edema, etc.): generalized deconditioning. did not inspect RBKA incision  End of Session PT - End of Session Equipment Utilized During Treatment: Gait belt Activity Tolerance: Patient limited by fatigue Patient left: in bed;with call bell/phone within reach;with bed alarm set Nurse Communication: Mobility status  GP Functional Assessment Tool Used: clinical judgement Functional Limitation: Changing and maintaining body position Changing and Maintaining Body Position Current Status (Q4920): At least 80 percent but less than 100 percent impaired, limited or restricted Changing and Maintaining Body Position Goal Status (F0071): At least 20 percent but less than 40 percent impaired, limited or restricted  Rosey Bath K. Manson Passey, [place] [phone] 11/20/2013, 2:27 PM

## 2013-11-20 NOTE — Telephone Encounter (Signed)
Pt caregiver-[place]ada-needs a letter stating pt's seizures and dementia began in 2005-2006. She needs the letter to show [facility] so she can try to get financial aid back

## 2013-11-20 NOTE — Progress Notes (Signed)
PCP Note:  I stopped to see [name] this afternoon. No family members were present in the room. She was reclined in bed in no distress. When asked how she was doing, she replied "My foot?" Other than that, I was not able to understand what she was trying to say.  Nursing staff is concerned that she is not able to swallow pills even with verbal cueing. She is doing well with liquid medications. Will recommend for FPTS to evaluate her medication list and change any pills to liquid, if possible. Otherwise, I agree with the plan of care and in particular with a palliative care consult.   I appreciate the FPTS excellent care of [name]. I will continue to follow along, and I am available if needed. If [name] is discharged to home rather than SNF, please have her follow up with me at the [facility].  Ann [name] M. Trevaughn Schear, M.D. 11/20/2013 1:46 PM

## 2013-11-20 NOTE — Progress Notes (Signed)
Thank you for consulting the Palliative Medicine Team at [facility] to meet your patient's and family's needs.   The reason that you asked [place] to see your patient is for GOC and options.   We have scheduled your patient for a meeting:  3/18 1400  The Surrogate decision make is: Vicotria Sedwick Contact information: [phone]   Other family members that need to be present: Beulah's discretion    Your patient is able/unable to participate: unable - dementia  Yong Channel, NP Palliative Medicine Team Pager # [phone] (M-F 8a-5p) Team Phone # [phone] (Nights/Weekends)

## 2013-11-20 NOTE — Progress Notes (Addendum)
FMTS Attending Note  I personally saw and evaluated the patient. The plan of care was discussed with the resident team. I agree with the assessment and plan as documented by the resident.   Daughter is at bedside. Ms. Pittard is back to her baseline mental status today. We discussed plan for return to SNF vs Home Health. The patients daughter is in the process of applying for secondary insurance through [place] and [address]. Ms. Luckenbill has used her 21 days covered by Medicare and if she returns to [facility] today the family would have to pay out of pocket. The case manager was at bedside and helped to facilitate this conversation.   Hip pain is stable. No plan for further workup is planned from Orthopedic Surgery during this admission. No fevers in greater than 24 hours.   Patient does have anemia today (Hemoblobin down to 10.4 from 12.6 at admission), there is likely some component of dilution. Repeat tomorrow morning.  Anticipate discharge tomorrow pending follow up of anemia and discharge planning/palliative care recs. Donnella Sham MD

## 2013-11-20 NOTE — Progress Notes (Signed)
Family Medicine Teaching Service Daily Progress Note Intern Pager: [phone]  Patient name: Ann NephewDaisy M Desroches Medical record number: [id number] Date of birth: [birth year] Age: 80 y.o. Gender: female  Primary Care Provider: Rodman PickleHAIRFORD, AMBER, MD Consultants: none Code Status: full  Pt Overview and Major Events to Date:  3/15 - admitted with fever, AMS  Assessment and Plan: Ann [name] is a 80 y.o. female presenting with fever, hip pain, and altered mental status . PMH is significant for dementia, seizures, CVA and remote alcohol abuse seen following admission to [facility] following hospitalization from 2/20-2/23/15 for Gangrenous R foot requiring R BKA, hx of DVT (not on anticoagulation), PAD, no known CAD or CHF.   #Altered Mental Status: afebrile since admission, no signs of infection, likely worsening of baseline dementia - Head CT showed remote infarct, no acute findings - if febrile again, will start Vanc/Zosyn  - f/u blood cx  - f/u urine cx  - am labs stable/unremarkable  - hip moving normally without apparent pain this am - unclear if this represents baseline, does not appear safe to d/c home without constant supervision - not eating since admission, speech and nutrition evals today  #Recent fall: CT of right hip was negative for fracture. Patient didn't hit her head. She is currently undergoing a short [facility].  - strict bed rest  - hip effusion on CT - pain with movement at that joint yesterday, today seems to have normal ROM without tenderness - given concurrent fever, ortho consult for possible tap - xray hip, doxycycline x1 month per ortho  #PAD s/p R BKA: Currently residing in nursing home for acute rehab following the surgery. Surgery occurred due to gangrene of the foot with known non-interventional peripheral vascular disease by Dr. Lajoyce Cornersuda.  - lipitor 20 mg  - hold home tramadol due to hx of seizures   #Dementia: On review of chart there is no  documented MMSE. Currently on namenda.  - Namenda 10 mg BID   #HTN: currently well controlled. Continue home medications.  - amlodipine 10 mg daily  - metoprolol 50 mg BID  - HCTZ 25 mg daily   #Seizures: In reviewing chart, last known seizure was 05/2013. Last carbamazepine level in EPIC was 7.1 on 02/19/13.  - CT head as above  - Seizure precautions.  - Carbamezepine 300 mg BID   FEN/GI: NS 50 mL/hr  Prophylaxis: SCD's   Disposition: home pending appropriate dispo plan  Subjective: not responding to most questions, tells me she has no pain and then says it hurts "down there"  Objective: Temp:  [98.2 F (36.8 C)-99.7 F (37.6 C)] 98.8 F (37.1 C) (03/17 0520) Pulse Rate:  [95-102] 101 (03/17 0520) Resp:  [20-23] 20 (03/17 0520) BP: (115-125)/(83-95) 122/91 mmHg (03/17 0520) SpO2:  [92 %-100 %] 98 % (03/17 0520) Physical Exam: General: NAD, African [place] female, Elderly, frail  HEENT: [place]/AT, tacky mucous membranes Cardiovascular: irregular rhythm, normal rate, no murmurs, clicks or gallops  Respiratory: CTAB, no wheezes, crackles, or rhonchi  Abdomen: soft, NTND, +BS, no HSM  Extremities: R BKA: postop incision clean dry and intact, no drainage, no warmth or erythema. Skin: no rashes  Neuro: opens eyes and speaks incoherently, does not respond to most questions but does deny pain  Laboratory:  Recent Labs Lab 11/18/13 2055 11/18/13 2110 11/19/13 0530 11/20/13 0734  WBC 8.0  --  7.0 5.4  HGB 11.5* 12.6 11.1* 10.4*  HCT 34.8* 37.0 33.4* 32.3*  PLT  201  --  172 166    Recent Labs Lab 11/18/13 2110 11/19/13 0530  NA 143 146  K 3.8 4.3  CL 103 107  CO2  --  25  BUN 35* 32*  CREATININE 1.10 0.91  CALCIUM  --  8.8  GLUCOSE 110* 96   BCx: NGTD UCx: No growth final  Imaging/Diagnostic Tests: CXR:  1. Stable cardiomegaly with tortuous aorta and great vessels.  2. Stable basilar atelectasis and/or scarring.   CT right hip w/out contrast:  1. No acute  abnormality of the right hip. Mild arthritic changes.  2. Severe spinal stenosis at L4-5 and L5-S1 with right foraminal stenosis of both levels.  CT head: 1. No acute intracranial process identified.  2. Remote right MCA territory infarct.  3. Moderate atrophy and chronic microvascular ischemic disease.  Beverely Low, MD 11/20/2013, 8:45 AM PGY-1, [facility] Family Medicine FPTS Intern pager: [phone], text pages welcome

## 2013-11-20 NOTE — Plan of Care (Signed)
Problem: Phase I Progression Outcomes Goal: Voiding-avoid urinary catheter unless indicated Outcome: Progressing Patient is incontinent.     

## 2013-11-21 ENCOUNTER — Other Ambulatory Visit: Payer: Self-pay

## 2013-11-21 DIAGNOSIS — R1310 Dysphagia, unspecified: Secondary | ICD-10-CM

## 2013-11-21 DIAGNOSIS — I359 Nonrheumatic aortic valve disorder, unspecified: Secondary | ICD-10-CM

## 2013-11-21 DIAGNOSIS — E440 Moderate protein-calorie malnutrition: Secondary | ICD-10-CM

## 2013-11-21 DIAGNOSIS — I4891 Unspecified atrial fibrillation: Secondary | ICD-10-CM

## 2013-11-21 DIAGNOSIS — S88119A Complete traumatic amputation at level between knee and ankle, unspecified lower leg, initial encounter: Secondary | ICD-10-CM

## 2013-11-21 DIAGNOSIS — F068 Other specified mental disorders due to known physiological condition: Secondary | ICD-10-CM

## 2013-11-21 DIAGNOSIS — Z515 Encounter for palliative care: Secondary | ICD-10-CM

## 2013-11-21 LAB — CBC
HCT: 34.8 % — ABNORMAL LOW (ref 36.0–46.0)
HEMOGLOBIN: 11.3 g/dL — AB (ref 12.0–15.0)
MCH: 28.8 pg (ref 26.0–34.0)
MCHC: 32.5 g/dL (ref 30.0–36.0)
MCV: 88.5 fL (ref 78.0–100.0)
Platelets: 147 10*3/uL — ABNORMAL LOW (ref 150–400)
RBC: 3.93 MIL/uL (ref 3.87–5.11)
RDW: 14 % (ref 11.5–15.5)
WBC: 5.9 10*3/uL (ref 4.0–10.5)

## 2013-11-21 LAB — TROPONIN I

## 2013-11-21 MED ORDER — METOPROLOL TARTRATE 1 MG/ML IV SOLN: 2.5000 mg | Freq: Three times a day (TID) | INTRAVENOUS | Status: DC | PRN

## 2013-11-21 MED ORDER — AMIODARONE HCL 200 MG PO TABS
400.0000 mg | ORAL_TABLET | Freq: Two times a day (BID) | ORAL | Status: DC
  Administered 2013-11-21 – 2013-11-22 (×2): 400 mg via ORAL
  Filled 2013-11-21 (×5): qty 2

## 2013-11-21 MED ORDER — DILTIAZEM HCL 25 MG/5ML IV SOLN
5.0000 mg | Freq: Once | INTRAVENOUS | Status: AC
  Administered 2013-11-21: 5 mg via INTRAVENOUS
  Filled 2013-11-21: qty 5

## 2013-11-21 MED ORDER — RIVAROXABAN 15 MG PO TABS
15.0000 mg | ORAL_TABLET | ORAL | Status: DC
  Administered 2013-11-21 – 2013-11-22 (×2): 15 mg via ORAL
  Filled 2013-11-21 (×3): qty 1

## 2013-11-21 MED ORDER — LISINOPRIL 5 MG PO TABS
5.0000 mg | ORAL_TABLET | Freq: Every day | ORAL | Status: DC
  Administered 2013-11-21 – 2013-11-22 (×2): 5 mg via ORAL
  Filled 2013-11-21 (×3): qty 1

## 2013-11-21 MED ORDER — CARVEDILOL 3.125 MG PO TABS
3.1250 mg | ORAL_TABLET | Freq: Two times a day (BID) | ORAL | Status: DC
  Administered 2013-11-22 (×2): 3.125 mg via ORAL
  Filled 2013-11-21 (×5): qty 1

## 2013-11-21 NOTE — Progress Notes (Addendum)
FMTS Attending Note  I personally saw and evaluated the patient. The plan of care was discussed with the resident team. I agree with the assessment and plan as documented by the resident.   Patient developed new onset Atrial Fibrillation, per review of telemetry it appears that she went into Afib in the early morning of 3/18. EKG performed this AM confirms atrial fibrillation, troponin checked this AM was negative for acute ischemia, Echocardiogram ordered to evaluate for structural abnormalities, patient currently on Lopressor 2.5 mg IV Q8 (patient currently has poor PO intake and not taking PO medications) , given Diltiazem X1, patient not currently in RVR, will consult cardiology for input on acute cardioversion. She is a poor candidate for full anticoagulation due to fall risk. Patient started on ASA 81 mg.   Hemoglobin improved from yesterday. No further workup for anemia planned at this time.   Palliative care meeting to occur this afternoon to discuss goals of care.   Donnella Sham MD

## 2013-11-21 NOTE — Clinical Documentation Improvement (Signed)
To Family Teaching Service  MD's, NP's , and PA's  Per dietitian consult "non severe  Moderate malnutrition" evidenced by "Inadequate oral intake related to inability to eat as evidenced by NPO status" adjusted bmi 28.1 overweight, if either clinical condition listed below is appropriate please document in chart. Thank you  Possible Clinical Conditions?  Severe Malnutrition  Moderate Malnutrition  Protein Calorie Malnutrition  Other Condition  Cannot clinically determine   Risk Factors: PVD s/p BKA, h/o cva, seizure   Signs : Pt meets criteria for moderate MALNUTRITION in the context of chronic illness as evidenced by moderate muscle mass loss and suspected intake of <75% x at least 1 month.    Treatment: Diet texture and liquid consistency per SLP.  Recommend Ensure Complete po BID,  Recommend 30 ml Prostat po BID,    Thank You,  Lavonda Jumbo ,RN Clinical Documentation Specialist:  [phone]  [facility]- Health Information Management

## 2013-11-21 NOTE — Consult Note (Signed)
Patient Ann [name]      DOB: 1928/03/31      YEB:[id number]     Consult Note from the Palliative Medicine Team at [facility] Requested by: Dr. Sherril Cong     PCP: Melrose Nakayama, MD Reason for Consultation: GOC and options     Phone Number:[phone]  Assessment of patients Current state: Ann [name] is a 80 yo female admitted with fever, hip pain, and altered mental status after falls from [facility] where she was attempting rehab after her [place] in February 2015. PMH of advanced dementia, seizures, CVA, remote history of alcohol abuse, DVT, PAD and gangrenous right foot requiring amputation. She continues to be extremely weak requiring 2 or more assist just to stand and pivot. She has also been placed on Dysphagia I thin liquid diet for likely esophageal dysphagia.  I met today with Ann [name]' daughter, Ann [name], to discuss goals of care. We had a long conversation today about Ann [name] dementia and how this is effecting her recovery from her amputation and has effected her swallowing function and ambulation. We discussed the natural progression of dementia and the complications that arise in advanced dementia and what to expect in the future. Ann [name] seems surprised to be learning this information and seems to understand her mother's declining health in relation to her dementia. We discussed code status and I strongly encouraged her to continue to think about what we would accomplish if Ann [name] were to code knowing that she has dementia that will continue to progress and worsen. Ann [name] seems to understand and says she needs to discuss with her daughter, Ann [name], who is a Quarry manager and Ms. Rosana Hoes raised Ann [name]. We also discussed the amount of support that Ann [name] will need when she goes home. Ann [name] tells me that her nephew, Ann [name] [phone]) helps Ann [name] and stays with her during the day and Ann [name] is there at night and her daughter, Ann [name] [phone]) lives there and helps when she is not  working. Ann [name] has also spoken with case management and they are planning to get a lift in the home. We also discussed that when she gets home and she continues to decline, that hospice is an option to help keep Ann [name] comfortable and make sure she does not suffer. Ann [name] is receptive to this information and open to these options knowing that Ann [name] will have limited time with her dementia diagnosis. I will continue to follow and support Ann [name] and her family. I will attempt to reach [facility] tomorrow by phone per [facility] request to share this information with her.    Goals of Care: 1.  Code Status: FULL   2. Scope of Treatment: No limitations in treatment. Utilize all available medical treatment at this time.    4. Disposition: Home with home health, family considering hospice but for future use.    3. Symptom Management:   1. Bowel Regimen: Dulcolax supp prn.  2. Fever: Acetaminophen prn.  3. Weakness: PT following.  4. Psychosocial: Emotional support provided to patient and daughter.    Brief HPI: 80 yo female admitted with fever, hip pain, and altered mental status after falls from [place] where she was attempting rehab after her BKA in February 2015. PMH of advanced dementia, seizures, CVA, remote history of alcohol abuse, DVT, PAD and gangrenous right foot requiring amputation. She continues to be extremely weak requiring 2 or more assist just to stand and pivot. She has also been placed on Dysphagia  I thin liquid diet for likely esophageal dysphagia.    ROS: Denies pain, nausea, constipation, anxiety.     PMH:  Past Medical History  Diagnosis Date  . Hypertension   . Dementia   . Hyperlipidemia   . Seizures   . Metatarsal bone fracture     Right - base of 5th  . DVT (deep venous thrombosis) 02/22/2013    02/18/2013: Left lower extremity (femoral and popliteal) subacute Treated with coumadin x 6-7 months    . Stroke   . Trigeminal neuralgia 11/03/2006     Qualifier: Diagnosis of  By: Eusebio Friendly    . CATARACT 11/03/2006    Qualifier: Diagnosis of  By: Eusebio Friendly    . HEARING LOSS NOS OR DEAFNESS 11/03/2006    Qualifier: Diagnosis of  By: Eusebio Friendly    . INCONTINENCE, URGE 11/03/2006    Qualifier: Diagnosis of  By: Eusebio Friendly       PSH: Past Surgical History  Procedure Laterality Date  . Toe surgery      right 5th toe amputation  . Amputation Right 10/26/2013    Procedure: AMPUTATION BELOW KNEE;  Surgeon: Newt Minion, MD;  Location: [facility];  Service: Orthopedics;  Laterality: Right;  Right Below Knee Amputation   I have reviewed the FH and SH and  If appropriate update it with new information. No Known Allergies Scheduled Meds: . aspirin  81 mg Oral Daily  . atorvastatin  20 mg Oral Daily  . carBAMazepine  150 mg Oral QID  . doxycycline  100 mg Oral BID  . hydrochlorothiazide  25 mg Oral Daily  . memantine  10 mg Oral BID  . metoprolol  2.5 mg Intravenous 3 times per day  . sodium chloride  3 mL Intravenous Q12H   Continuous Infusions: . sodium chloride 50 mL/hr (11/21/13 0700)   PRN Meds:.acetaminophen, acetaminophen, bisacodyl, hydrALAZINE    BP 100/74  Pulse 102  Temp(Src) 98.2 F (36.8 C) (Oral)  Resp 18  Ht _0  (1.549 m)  Wt 61.7 kg (136 lb 0.4 oz)  BMI 25.71 kg/m2  SpO2 100%   PPS: 30%   Intake/Output Summary (Last 24 hours) at 11/21/13 1557 Last data filed at 11/21/13 1233  Gross per 24 hour  Intake    190 ml  Output      0 ml  Net    190 ml   LBM: 11/21/13                         Physical Exam:  General: NAD, chronically ill appearing, frail HEENT: + temporal muscle wasting Chest: CTA throughout, no labored breathing, symmetric CVS: Irreg, S1 S2 Abdomen: Soft, NT, ND, +BS Ext: Rt BKA (~45 month old), warm to touch, no edema Neuro: Alert, confused, oriented to person only  Labs: CBC    Component Value Date/Time   WBC 5.9 11/21/2013 0944   RBC 3.93 11/21/2013 0944    HGB 11.3* 11/21/2013 0944   HCT 34.8* 11/21/2013 0944   PLT 147* 11/21/2013 0944   MCV 88.5 11/21/2013 0944   MCH 28.8 11/21/2013 0944   MCHC 32.5 11/21/2013 0944   RDW 14.0 11/21/2013 0944   LYMPHSABS 0.7 11/18/2013 2055   MONOABS 0.9 11/18/2013 2055   EOSABS 0.0 11/18/2013 2055   BASOSABS 0.0 11/18/2013 2055    BMET    Component Value Date/Time   NA 146 11/19/2013 0530   K 4.3 11/19/2013 0530  CL 107 11/19/2013 0530   CO2 25 11/19/2013 0530   GLUCOSE 96 11/19/2013 0530   BUN 32* 11/19/2013 0530   CREATININE 0.91 11/19/2013 0530   CREATININE 1.11* 02/28/2013 1652   CALCIUM 8.8 11/19/2013 0530   GFRNONAA 57* 11/19/2013 0530   GFRAA 66* 11/19/2013 0530    CMP     Component Value Date/Time   NA 146 11/19/2013 0530   K 4.3 11/19/2013 0530   CL 107 11/19/2013 0530   CO2 25 11/19/2013 0530   GLUCOSE 96 11/19/2013 0530   BUN 32* 11/19/2013 0530   CREATININE 0.91 11/19/2013 0530   CREATININE 1.11* 02/28/2013 1652   CALCIUM 8.8 11/19/2013 0530   PROT 7.6 10/26/2013 1126   ALBUMIN 3.1* 10/26/2013 1126   AST 48* 10/26/2013 1126   ALT 41* 10/26/2013 1126   ALKPHOS 126* 10/26/2013 1126   BILITOT 0.4 10/26/2013 1126   GFRNONAA 57* 11/19/2013 0530   GFRAA 66* 11/19/2013 0530    Time In Time Out Total Time Spent with Patient Total Overall Time  1430 1550 98mn 875m    Greater than 50%  of this time was spent counseling and coordinating care related to the above assessment and plan.  AlVinie SillNP Palliative Medicine Team Pager # [phone]-F 8a-5p) Team Phone # [phone]/Weekends)

## 2013-11-21 NOTE — Progress Notes (Signed)
Physical Therapy Treatment Patient Details Name: Ann [name] MRN: [id number] DOB: March 09, 1928 Today's Date: 11/21/2013 Time: 1325-1350 PT Time Calculation (min): 25 min  PT Assessment / Plan / Recommendation  History of Present Illness Ann [name] is a 80 y.o. female presenting with fever, hip pain, and altered mental status . PMH is significant for dementia, seizures, CVA and remote alcohol abuse seen following admission to [facility] home following hospitalization from 2/20-2/23/15 for Gangrenous R foot requiring R BKA, hx of DVT (not on anticoagulation), PAD, no known CAD or CHF.Per chart, pt was preparing to d/c to home with HHPT just prior to admission   PT Comments   Patient able to stand, but not maintain standing today.  Still weak and with significant left knee arthritic changes affecting standing stability.  Agree with return to SNF for continued rehab.  Follow Up Recommendations  Supervision/Assistance - 24 hour;SNF     Does the patient have the potential to tolerate intense rehabilitation   N/A  Barriers to Discharge  None      Equipment Recommendations  Wheelchair (measurements PT)    Recommendations for Other Services  None  Frequency Min 3X/week   Progress towards PT Goals Progress towards PT goals: Progressing toward goals  Plan Current plan remains appropriate    Precautions / Restrictions Precautions Precautions: Fall Precaution Comments: right BKA   Pertinent Vitals/Pain Min c/o pain right BKA    Mobility  Bed Mobility Bed Mobility: Supine to Sit Supine to sit: HOB elevated;Mod assist General bed mobility comments: sat up with HOB elevated and initiated scooting out/moving legs to edge of bed, assist with pad under patient to scoot forward due to slow and laborious progression Transfers Overall transfer level: Needs assistance Equipment used: Rolling walker (2 wheeled) Transfers: Sit to/from [employer] Sit to Stand: Mod  assist;Max assist Squat pivot transfers: Max assist;+2 physical assistance General transfer comment: stood to walker with mod assist, unable to maintain standing due to flexed posture, knee valgus and foot sliding forward; pivot to chair without devices pivot with +2 assist for safety (patient fearful to move without walker)      PT Goals (current goals can now be found in the care plan section)    Visit Information  Last PT Received On: 11/21/13 Assistance Needed: +2 History of Present Illness: Ann [name] is a 80 y.o. female presenting with fever, hip pain, and altered mental status . PMH is significant for dementia, seizures, CVA and remote alcohol abuse seen following admission to [facility] home following hospitalization from 2/20-2/23/15 for Gangrenous R foot requiring R BKA, hx of DVT (not on anticoagulation), PAD, no known CAD or CHF.Per chart, pt was preparing to d/c to home with HHPT just prior to admission    Subjective Data      Cognition  Cognition Arousal/Alertness: Awake/alert Behavior During Therapy: WFL for tasks assessed/performed Overall Cognitive Status: No family/caregiver present to determine baseline cognitive functioning Memory: Decreased short-term memory    Balance  Balance Sitting-balance support: Feet supported;No upper extremity supported Sitting balance-Leahy Scale: Good Sitting balance - Comments: reaches to floor with to fix sock sitting edge of bed Standing balance-Leahy Scale: Zero  End of Session PT - End of Session Equipment Utilized During Treatment: Gait belt Activity Tolerance: Patient limited by fatigue Patient left: with chair alarm set;in chair   GP     [facility] 11/21/2013, 3:01 PM [place], PT [phone] 11/21/2013

## 2013-11-21 NOTE — Progress Notes (Signed)
RN informed by central monitoring of ekg changes.  Pt now in suspected a-fib.  EKG performed and confirmed a-fib with RVR.  MD at bedside and consulted with.  No changes at this time.  Pt currently on low dose beta blocker.  Will be discussed further on rounds.  Connye Burkitt RN, BSN

## 2013-11-21 NOTE — Evaluation (Signed)
Clinical/Bedside Swallow Evaluation Patient Details  Name: Ann [name] MRN: [id number] Date of Birth: 16-Feb-1928  Today's Date: 11/21/2013 Time: 0945-1000 SLP Time Calculation (min): 15 min  Past Medical History:  Past Medical History  Diagnosis Date  . Hypertension   . Dementia   . Hyperlipidemia   . Seizures   . Metatarsal bone fracture     Right - base of 5th  . DVT (deep venous thrombosis) 02/22/2013    02/18/2013: Left lower extremity (femoral and popliteal) subacute Treated with coumadin x 6-7 months    . Stroke   . Trigeminal neuralgia 11/03/2006    Qualifier: Diagnosis of  By: Abundio MiuMcGregor, Barbara    . CATARACT 11/03/2006    Qualifier: Diagnosis of  By: Abundio MiuMcGregor, Barbara    . HEARING LOSS NOS OR DEAFNESS 11/03/2006    Qualifier: Diagnosis of  By: Abundio MiuMcGregor, Barbara    . INCONTINENCE, URGE 11/03/2006    Qualifier: Diagnosis of  By: Abundio MiuMcGregor, Barbara     Past Surgical History:  Past Surgical History  Procedure Laterality Date  . Toe surgery      right 5th toe amputation  . Amputation Right 10/26/2013    Procedure: AMPUTATION BELOW KNEE;  Surgeon: Nadara MustardMarcus V Duda, MD;  Location: [facility] OR;  Service: Orthopedics;  Laterality: Right;  Right Below Knee Amputation   HPI:  Ann [name] is a 80 y.o. female presenting with fever, hip pain, and altered mental status . PMH is significant for dementia, seizures, CVA and remote alcohol abuse seen following admission [facility] following hospitalization from 2/20-2/23/15 for Gangrenous R foot requiring R BKA, hx of DVT (not on anticoagulation), PAD, no known CAD or CHF.    Assessment / Plan / Recommendation Clinical Impression  Pt demonstrates evidence of possible esophageal dysphagia. No overt signs of aspiration, but pt does immediately attempt to belch, grimaces, has multiple swallows. Pt reportedly expectorated ensure last night as well. Recommend dys 1/thin liquids, pills crushed iwth esopahgeal strategies posted over the bed.  SLP will f/u tomorrow for tolerance. Pt may need esophageal assessment if regurgitation continues.     Aspiration Risk  Moderate    Diet Recommendation Dysphagia 1 (Puree);Thin liquid   Liquid Administration via: Cup;Straw Medication Administration: Crushed with puree Supervision: Staff to assist with self feeding Compensations: Slow rate;Small sips/bites;Follow solids with liquid Postural Changes and/or Swallow Maneuvers: Seated upright 90 degrees    Other  Recommendations Oral Care Recommendations: Oral care BID   Follow Up Recommendations  24 hour supervision/assistance    Frequency and Duration min 2x/week  1 week   Pertinent Vitals/Pain NA    SLP Swallow Goals     Swallow Study Prior Functional Status       General HPI: Ann [name] is a 80 y.o. female presenting with fever, hip pain, and altered mental status . PMH is significant for dementia, seizures, CVA and remote alcohol abuse seen following [facility] following hospitalization from 2/20-2/23/15 for Gangrenous R foot requiring R BKA, hx of DVT (not on anticoagulation), PAD, no known CAD or CHF.  Type of Study: Bedside swallow evaluation Diet Prior to this Study: NPO Temperature Spikes Noted: No Respiratory Status: Room air History of Recent Intubation: No Behavior/Cognition: Alert;Cooperative;Pleasant mood;Confused;Requires cueing Oral Cavity - Dentition: Edentulous Self-Feeding Abilities: Able to feed self;Needs assist Patient Positioning: Upright in bed Baseline Vocal Quality: Hoarse;Breathy Volitional Cough: Cognitively unable to elicit Volitional Swallow: Able to elicit    Oral/Motor/Sensory Function Overall Oral  Motor/Sensory Function: Appears within functional limits for tasks assessed   Ice Chips     Thin Liquid Thin Liquid: Impaired Presentation: Cup;Straw;Self Fed Pharyngeal  Phase Impairments: Multiple swallows (attempts to belch)    Nectar Thick Nectar Thick Liquid:  Not tested   Honey Thick Honey Thick Liquid: Not tested   Puree Puree: Within functional limits   Solid   GO    Solid: Impaired Presentation: Self Fed Oral Phase Impairments: Impaired mastication;Impaired anterior to posterior transit Oral Phase Functional Implications: Oral residue      [facility], [place] CCC-SLP [phone]  Claudine Mouton 11/21/2013,11:22 AM

## 2013-11-21 NOTE — Progress Notes (Signed)
*  PRELIMINARY RESULTS* Echocardiogram 2D Echocardiogram has been performed.  Ann [name] 11/21/2013, 4:10 PM

## 2013-11-21 NOTE — Progress Notes (Signed)
ANTICOAGULATION CONSULT NOTE - Initial Consult  Pharmacy Consult for Rivaroxaban Indication: atrial fibrillation  No Known Allergies  Patient Measurements: Height: 5\' 1"  (154.9 cm) Weight: 136 lb 0.4 oz (61.7 kg) (scds off bed) IBW/kg (Calculated) : 47.8 Heparin Dosing Weight:   Vital Signs: Temp: 98.4 F (36.9 C) (03/18 1807) Temp src: Oral (03/18 1807) BP: 135/71 mmHg (03/18 1807) Pulse Rate: 100 (03/18 1807)  Labs:  Recent Labs  11/18/13 2110 11/19/13 0530 11/20/13 0734 11/21/13 0944 11/21/13 1105  HGB 12.6 11.1* 10.4* 11.3*  --   HCT 37.0 33.4* 32.3* 34.8*  --   PLT  --  172 166 147*  --   CREATININE 1.10 0.91  --   --   --   TROPONINI  --   --   --   --  <0.30    Estimated Creatinine Clearance: 39.5 ml/min (by C-G formula based on Cr of 0.91).   Medical History: Past Medical History  Diagnosis Date  . Hypertension   . Dementia   . Hyperlipidemia   . Seizures   . Metatarsal bone fracture     Right - base of 5th  . DVT (deep venous thrombosis) 02/22/2013    02/18/2013: Left lower extremity (femoral and popliteal) subacute Treated with coumadin x 6-7 months    . Stroke   . Trigeminal neuralgia 11/03/2006    Qualifier: Diagnosis of  By: Abundio Miu    . CATARACT 11/03/2006    Qualifier: Diagnosis of  By: Abundio Miu    . HEARING LOSS NOS OR DEAFNESS 11/03/2006    Qualifier: Diagnosis of  By: Abundio Miu    . INCONTINENCE, URGE 11/03/2006    Qualifier: Diagnosis of  By: Abundio Miu      Medications:  Scheduled:  . amiodarone  400 mg Oral BID  . atorvastatin  20 mg Oral Daily  . carBAMazepine  150 mg Oral QID  . [START ON 11/22/2013] carvedilol  3.125 mg Oral BID WC  . doxycycline  100 mg Oral BID  . lisinopril  5 mg Oral Daily  . memantine  10 mg Oral BID  . sodium chloride  3 mL Intravenous Q12H    Assessment: 80yo female with AFib/RVR, to start Xarelto.  She was on no anticoagulation at home.  She had completed 6-82mo of  Coumadin, ~12/14, for DVT.  Hg 11.3 and pltc 147 (201 on admission).  Estimated CrCl 3ml/min, dosage will need adjusting.  Goal of Therapy:  Anticoagulation for prevention of stroke Monitor platelets by anticoagulation protocol: Yes   Plan:  1-  Xarelto 15mg  po daily 2-  Watch for s/s bleeding 3-  Family will need to be present for education  Marisue Humble, PharmD Clinical Pharmacist [facility]

## 2013-11-21 NOTE — Progress Notes (Signed)
Family Medicine Teaching Service Daily Progress Note Intern Pager: [phone]  Patient name: Ann [name] Medical record number: [id number] Date of birth: November 07, 1927 Age: 80 y.o. Gender: female  Primary Care Provider: Rodman PickleHAIRFORD, AMBER, MD Consultants: none Code Status: full  Pt Overview and Major Events to Date:  3/15 - admitted with fever, AMS  ABX/Culture Doxycycline (3/17)  Blood cx (3/16)>>NGTD  Urine cx (3/16)>> No growth   Assessment and Plan: Ann [name] is a 80 y.o. female presenting with fever, hip pain, and altered mental status . PMH is significant for dementia, seizures, CVA and remote alcohol abuse seen following [facility] following hospitalization from 2/20-2/23/15 for Gangrenous R foot requiring R BKA, hx of DVT (not on anticoagulation), PAD, no known CAD or CHF.   #New onset Afib/PAF: No previous evidence on EKG's in her chart.  - dilt bolus; may need to bolus more than once to control heart rate - may need a dilt drip if not rate controlled.  - on metoprolol 50 mg BID, stop amlodipine  - based on palliative meeting, will decided anticoagulation. Most likely just an ASA 81 mg   #Altered Mental Status: improved since admission. Appears she is at her baseline mentation.  Afebrile since admission.  - Head CT showed remote infarct, no acute findings - if febrile again, will start Vanc/Zosyn  - not eating since admission, f/u palliative care consult   #Recent fall:  hip effusion on CT - strict bed rest  - Ortho recs: started doxy, use ace wrap for compression.   #PAD s/p R BKA: Currently residing in nursing home for acute rehab following the surgery. Surgery occurred due to gangrene of the foot with known non-interventional peripheral vascular disease by Dr. Lajoyce Cornersuda.  - lipitor 20 mg  - hold home tramadol due to hx of seizures   #Dementia: On review of chart there is no documented MMSE. Currently on namenda.  - Namenda 10 mg BID   #HTN:  currently well controlled. Continue home medications.  - amlodipine 10 mg daily>>d/c'd today  - metoprolol 50 mg BID  - HCTZ 25 mg daily   #Seizures: In reviewing chart, last known seizure was 05/2013. Last carbamazepine level in EPIC was 7.1 on 02/19/13.  - CT head as above  - Seizure precautions.  - Carbamezepine 300 mg BID   FEN/GI: NS 50 mL/hr  Prophylaxis: SCD's   Disposition: home pending appropriate dispo plan  Subjective: patient appears to be at her baseline mentally. She had no acute events overnight but found to be with new onset Afib this AM.   Objective: Temp:  [98.2 F (36.8 C)-98.6 F (37 C)] 98.2 F (36.8 C) (03/18 0735) Pulse Rate:  [94-120] 102 (03/18 0735) Resp:  [18-22] 18 (03/18 0735) BP: (100-128)/(72-95) 100/74 mmHg (03/18 0735) SpO2:  [94 %-100 %] 100 % (03/18 0735) Weight:  [136 lb 0.4 oz (61.7 kg)-141 lb 8.6 oz (64.2 kg)] 136 lb 0.4 oz (61.7 kg) (03/18 0735) Physical Exam: General: NAD, African [place] female, Elderly, frail, sitting up in bed,  HEENT: [place]/AT Cardiovascular: irregular rhythm, tachycardic, no murmurs, clicks or gallops  Respiratory: CTAB, no wheezes, crackles, or rhonchi  Abdomen: soft, NTND, +BS, no HSM  Extremities: R BKA: postop incision clean dry and intact, no drainage, no warmth or erythema. Skin: no rashes  Neuro: only oriented to self,    Laboratory:  Recent Labs Lab 11/18/13 2055 11/18/13 2110 11/19/13 0530 11/20/13 0734  WBC 8.0  --  7.0  5.4  HGB 11.5* 12.6 11.1* 10.4*  HCT 34.8* 37.0 33.4* 32.3*  PLT 201  --  172 166    Recent Labs Lab 11/18/13 2110 11/19/13 0530  NA 143 146  K 3.8 4.3  CL 103 107  CO2  --  25  BUN 35* 32*  CREATININE 1.10 0.91  CALCIUM  --  8.8  GLUCOSE 110* 96   BCx: NGTD UCx: No growth final  Imaging/Diagnostic Tests: CXR:  1. Stable cardiomegaly with tortuous aorta and great vessels.  2. Stable basilar atelectasis and/or scarring.   CT right hip w/out contrast:  1. No acute  abnormality of the right hip. Mild arthritic changes.  2. Severe spinal stenosis at L4-5 and L5-S1 with right foraminal stenosis of both levels.  CT head: 1. No acute intracranial process identified.  2. Remote right MCA territory infarct.  3. Moderate atrophy and chronic microvascular ischemic disease.  Myra Rude, MD 11/21/2013, 8:52 AM PGY-1, [place] [facility] FPTS Intern pager: [phone], text pages welcome

## 2013-11-21 NOTE — Consult Note (Signed)
Reason for Consult: A. fib with RVR Referring Physician: Family medicine  Toney ReilDaisy Burnell BlanksM Ann [name] is an 80 y.o. female.  HPI: Patient is 80 year old female with past medical history significant for hypertension history of CVA, peripheral arterial disease status post below knee amputation right leg, dementia, history of seizure disorder, hypercholesteremia, history of DVT in the past, dementia resident of skilled nursing facility was admitted 3 days ago because of fever and altered mental status due to possible sepsis of the right leg stump. Cardiologic consultation is called as patient was noted to have new-onset A. fib with RVR. Patient denies any palpitations lightheadedness or syncopal episode. Denies any chest pain. Denies any shortness of breath. Patient received 5 mg of IV Cardizem earlier today with control of heart rate in lower 100. Patient is daughter denied any history of thyroid problems or history of rheumatic heart disease in the past.  Past Medical History  Diagnosis Date  . Hypertension   . Dementia   . Hyperlipidemia   . Seizures   . Metatarsal bone fracture     Right - base of 5th  . DVT (deep venous thrombosis) 02/22/2013    02/18/2013: Left lower extremity (femoral and popliteal) subacute Treated with coumadin x 6-7 months    . Stroke   . Trigeminal neuralgia 11/03/2006    Qualifier: Diagnosis of  By: Abundio MiuMcGregor, Barbara    . CATARACT 11/03/2006    Qualifier: Diagnosis of  By: Abundio MiuMcGregor, Barbara    . HEARING LOSS NOS OR DEAFNESS 11/03/2006    Qualifier: Diagnosis of  By: Abundio MiuMcGregor, Barbara    . INCONTINENCE, URGE 11/03/2006    Qualifier: Diagnosis of  By: Abundio MiuMcGregor, Barbara      Past Surgical History  Procedure Laterality Date  . Toe surgery      right 5th toe amputation  . Amputation Right 10/26/2013    Procedure: AMPUTATION BELOW KNEE;  Surgeon: Nadara MustardMarcus V Duda, MD;  Location: [facility] OR;  Service: Orthopedics;  Laterality: Right;  Right Below Knee Amputation    Family History  Problem  Relation Age of Onset  . Hypertension Mother   . Arthritis Father   . Heart disease Daughter     Heart Disease before age 78  . Hyperlipidemia Daughter   . Hypertension Daughter     Social History:  reports that she has never smoked. She has never used smokeless tobacco. She reports that she does not drink alcohol or use illicit drugs.  Allergies: No Known Allergies  Medications: I have reviewed the patient's current medications.  Results for orders placed during the hospital encounter of 11/18/13 (from the past 48 hour(s))  CBC     Status: Abnormal   Collection Time    11/20/13  7:34 AM      Result Value Ref Range   WBC 5.4  4.0 - 10.5 K/uL   RBC 3.67 (*) 3.87 - 5.11 MIL/uL   Hemoglobin 10.4 (*) 12.0 - 15.0 g/dL   HCT 96.232.3 (*) 95.236.0 - 84.146.0 %   MCV 88.0  78.0 - 100.0 fL   MCH 28.3  26.0 - 34.0 pg   MCHC 32.2  30.0 - 36.0 g/dL   RDW 32.414.1  40.111.5 - 02.715.5 %   Platelets 166  150 - 400 K/uL  C-REACTIVE PROTEIN     Status: Abnormal   Collection Time    11/20/13 10:07 AM      Result Value Ref Range   CRP 21.6 (*) <0.60 mg/dL   Comment: Performed at  [facility]  SEDIMENTATION RATE     Status: Abnormal   Collection Time    11/20/13 10:07 AM      Result Value Ref Range   Sed Rate 68 (*) 0 - 22 mm/hr  CBC     Status: Abnormal   Collection Time    11/21/13  9:44 AM      Result Value Ref Range   WBC 5.9  4.0 - 10.5 K/uL   RBC 3.93  3.87 - 5.11 MIL/uL   Hemoglobin 11.3 (*) 12.0 - 15.0 g/dL   HCT 16.1 (*) 09.6 - 04.5 %   MCV 88.5  78.0 - 100.0 fL   MCH 28.8  26.0 - 34.0 pg   MCHC 32.5  30.0 - 36.0 g/dL   RDW 40.9  81.1 - 91.4 %   Platelets 147 (*) 150 - 400 K/uL  TROPONIN I     Status: None   Collection Time    11/21/13 11:05 AM      Result Value Ref Range   Troponin I <0.30  <0.30 ng/mL   Comment:            Due to the release kinetics of cTnI,     a negative result within the first hours     of the onset of symptoms does not rule out     myocardial infarction  with certainty.     If myocardial infarction is still suspected,     repeat the test at appropriate intervals.    Dg Hip Complete Right  11/20/2013   CLINICAL DATA:  Right-sided hip pain.  EXAM: RIGHT HIP - COMPLETE 2+ VIEW  COMPARISON:  CT of the right hip 11/18/2013.  FINDINGS: AP view of the pelvis demonstrates no acute displaced fracture of the bony pelvic ring. Partial sacralization of the left side of L5 is noted. Extensive vascular calcifications and numerous pelvic phleboliths are incidentally noted. AP and lateral views of the right hip demonstrate no acute displaced fracture. The hip is located. Mild joint space narrowing, subchondral sclerosis and osteophyte formation is present, compatible with mild osteoarthritis  IMPRESSION: 1. No acute radiographic abnormality of the bony pelvis or the right hip. 2. Mild osteoarthritis of the right hip joint. 3. Extensive atherosclerosis.   Electronically Signed   By: Trudie Reed M.D.   On: 11/20/2013 13:19    Review of Systems  Eyes: Negative for double vision and photophobia.  Cardiovascular: Negative for chest pain, palpitations and orthopnea.  Gastrointestinal: Negative for nausea, vomiting and abdominal pain.  Genitourinary: Negative for dysuria.  Neurological: Negative for dizziness and headaches.   Blood pressure 135/71, pulse 100, temperature 98.4 F (36.9 C), temperature source Oral, resp. rate 18, height 5\' 1"  (1.549 m), weight 61.7 kg (136 lb 0.4 oz), SpO2 100.00%. Physical Exam  HENT:  Head: Normocephalic and atraumatic.  Eyes: Conjunctivae are normal. Left eye exhibits no discharge. No scleral icterus.  Neck: Normal range of motion. Neck supple. No JVD present. No tracheal deviation present. No thyromegaly present.  Cardiovascular:  Irregularly irregular S1-S2 soft there is soft systolic and diastolic murmur noted no S3 gallop  Respiratory: Effort normal and breath sounds normal. No respiratory distress. She has no wheezes.  She has no rales.  GI: Soft. Bowel sounds are normal. She exhibits no distension. There is no tenderness.  Musculoskeletal:  Right below-knee amputation left leg trace edema noted  Neurological: She is alert.    Assessment/Plan: New-onset A. fib with RVR Possible  tachycardia induced mild cardiomyopathy Hypertension History of CVA PAD status post right BKA Status post altered mental status Dementia History of seizure disorder History of DVT Plan Discussed with patient's daughter at length various options of treatment i.e. rate control versus rhythm control and TEE cardioversion his risk and benefits daughter agreed for rate control and possible chemical cardioversion only. Agrees for chronic anticoagulation. Understands the risk and benefits. We'll add low-dose ACE inhibitors and beta blockers and start amiodarone and XARELTO as per orders  Lemoine Goyne N 11/21/2013, 6:09 PM

## 2013-11-22 DIAGNOSIS — K219 Gastro-esophageal reflux disease without esophagitis: Secondary | ICD-10-CM

## 2013-11-22 DIAGNOSIS — R1310 Dysphagia, unspecified: Secondary | ICD-10-CM

## 2013-11-22 DIAGNOSIS — E440 Moderate protein-calorie malnutrition: Secondary | ICD-10-CM

## 2013-11-22 MED ORDER — AMIODARONE HCL 400 MG PO TABS: 400.0000 mg | ORAL_TABLET | Freq: Two times a day (BID) | ORAL | Status: DC

## 2013-11-22 MED ORDER — RIVAROXABAN 15 MG PO TABS: 15.0000 mg | ORAL_TABLET | ORAL | Status: DC

## 2013-11-22 MED ORDER — CARVEDILOL 3.125 MG PO TABS: 3.1250 mg | ORAL_TABLET | Freq: Two times a day (BID) | ORAL | Status: DC

## 2013-11-22 MED ORDER — LISINOPRIL 5 MG PO TABS: 5.0000 mg | ORAL_TABLET | Freq: Every day | ORAL | Status: DC

## 2013-11-22 NOTE — Progress Notes (Signed)
Family Medicine Teaching Service Daily Progress Note Intern Pager: [phone]  Patient name: Ann [name] Medical record number: [id number] Date of birth: 1927-12-02 Age: 80 y.o. Gender: female  Primary Care Provider: Rodman PickleHAIRFORD, AMBER, MD Consultants: none Code Status: full  Pt Overview and Major Events to Date:  3/15 - admitted with fever, AMS  Assessment and Plan: Ann NephewDaisy M [name] is a 80 y.o. female presenting with fever, hip pain, and altered mental status . PMH is significant for dementia, seizures, CVA and remote alcohol abuse seen following [facility] following hospitalization from 2/20-2/23/15 for Gangrenous R foot requiring R BKA, hx of DVT (not on anticoagulation), PAD, no known CAD or CHF.   #New onset Afib/PAF: No previous evidence on EKG's in her chart.  - dilt bolus; may need to bolus more than once to control heart rate - coreg, amiodarone, lisinopril per cards  - xarelto started 3/18 per cards  #Altered Mental Status: improved since admission. Appears she is at her baseline mentation.  Afebrile since admission.  - Head CT showed remote infarct, no acute findings - if febrile again, will start Vanc/Zosyn  - dysphagia 1 diet, thin liquids   #Recent fall:  hip effusion on CT - strict bed rest  - Ortho recs: started doxy, use ace wrap for compression.   #PAD s/p R BKA: Currently residing in nursing home for acute rehab following the surgery. Surgery occurred due to gangrene of the foot with known non-interventional peripheral vascular disease by Dr. Lajoyce Cornersuda.  - lipitor 20 mg  - hold home tramadol due to hx of seizures  - continue doxycycline x1 month  #Dementia, severe: On review of chart there is no documented MMSE. Currently on namenda.  - Namenda 10 mg BID   #HTN: currently well controlled. - stopped home amlodipine, hctz, metop - coreg 3.125 bid - lisinopril 5mg  daily   #Seizures: In reviewing chart, last known seizure was 05/2013. Last  carbamazepine level in EPIC was 7.1 on 02/19/13.  - CT head as above  - Seizure precautions.  - Carbamezepine 300 mg BID   FEN/GI: NS 50 mL/hr  Prophylaxis: SCD's   Disposition: home today with home health  Subjective: patient appears to be at her baseline mentally. She had no acute events overnight. She says she feels fine but complains of having "doodoo"  Objective: Temp:  [98.3 F (36.8 C)-98.6 F (37 C)] 98.3 F (36.8 C) (03/19 0518) Pulse Rate:  [100-116] 116 (03/19 0518) Resp:  [18] 18 (03/19 0518) BP: (90-135)/(52-73) 90/52 mmHg (03/19 0518) SpO2:  [98 %-100 %] 98 % (03/19 0518) Weight:  [138 lb 10.7 oz (62.9 kg)] 138 lb 10.7 oz (62.9 kg) (03/18 2109) Physical Exam: General: NAD, African [place] female, Elderly, frail, lying in bed,  HEENT: [place]/AT, MMM Cardiovascular: irregularly irregular rhythm, normal rate, no murmurs, clicks or gallops  Respiratory: CTAB, no wheezes, crackles, or rhonchi  Abdomen: soft, NTND, +BS, no HSM  Extremities: R BKA: postop incision clean dry and intact, no drainage, no warmth or erythema. Skin: no rashes  Neuro: only oriented to self   Laboratory:  Recent Labs Lab 11/19/13 0530 11/20/13 0734 11/21/13 0944  WBC 7.0 5.4 5.9  HGB 11.1* 10.4* 11.3*  HCT 33.4* 32.3* 34.8*  PLT 172 166 147*    Recent Labs Lab 11/18/13 2110 11/19/13 0530  NA 143 146  K 3.8 4.3  CL 103 107  CO2  --  25  BUN 35* 32*  CREATININE 1.10 0.91  CALCIUM  --  8.8  GLUCOSE 110* 96   BCx: NGTD UCx: No growth final  Imaging/Diagnostic Tests: CXR:  1. Stable cardiomegaly with tortuous aorta and great vessels.  2. Stable basilar atelectasis and/or scarring.   CT right hip w/out contrast:  1. No acute abnormality of the right hip. Mild arthritic changes.  2. Severe spinal stenosis at L4-5 and L5-S1 with right foraminal stenosis of both levels.  CT head: 1. No acute intracranial process identified.  2. Remote right MCA territory infarct.  3.  Moderate atrophy and chronic microvascular ischemic disease.  Beverely Low, MD 11/22/2013, 7:58 AM PGY-1, [facility] FPTS Intern pager: [phone], text pages welcome

## 2013-11-22 NOTE — Progress Notes (Signed)
Subjective:  Patient denies any complaints. Remains in A. fib with moderate ventricular response  Objective:  Vital Signs in the last 24 hours: Temp:  [97.9 F (36.6 C)-98.6 F (37 C)] 97.9 F (36.6 C) (03/19 0815) Pulse Rate:  [100-116] 102 (03/19 0815) Resp:  [18-19] 19 (03/19 0815) BP: (90-135)/(52-78) 102/78 mmHg (03/19 0815) SpO2:  [98 %-100 %] 100 % (03/19 0815) Weight:  [62.9 kg (138 lb 10.7 oz)] 62.9 kg (138 lb 10.7 oz) (03/18 2109)  Intake/Output from previous day: 03/18 0701 - 03/19 0700 In: 1000 [P.O.:450; I.V.:550] Out: -  Intake/Output from this shift:    Physical Exam: Neck: no adenopathy, no carotid bruit, no JVD and supple, symmetrical, trachea midline Lungs: clear to auscultation bilaterally Heart: irregularly irregular rhythm, S1, S2 normal and Soft systolic and diastolic murmur noted Abdomen: soft, non-tender; bowel sounds normal; no masses,  no organomegaly Extremities: extremities normal, atraumatic, no cyanosis or edema and Right below-knee impression  Lab Results:  Recent Labs  11/20/13 0734 11/21/13 0944  WBC 5.4 5.9  HGB 10.4* 11.3*  PLT 166 147*   No results found for this basename: NA, K, CL, CO2, GLUCOSE, BUN, CREATININE,  in the last 72 hours  Recent Labs  11/21/13 1105  TROPONINI <0.30   Hepatic Function Panel No results found for this basename: PROT, ALBUMIN, AST, ALT, ALKPHOS, BILITOT, BILIDIR, IBILI,  in the last 72 hours No results found for this basename: CHOL,  in the last 72 hours No results found for this basename: PROTIME,  in the last 72 hours  Imaging: Imaging results have been reviewed and Dg Hip Complete Right  11/20/2013   CLINICAL DATA:  Right-sided hip pain.  EXAM: RIGHT HIP - COMPLETE 2+ VIEW  COMPARISON:  CT of the right hip 11/18/2013.  FINDINGS: AP view of the pelvis demonstrates no acute displaced fracture of the bony pelvic ring. Partial sacralization of the left side of L5 is noted. Extensive vascular  calcifications and numerous pelvic phleboliths are incidentally noted. AP and lateral views of the right hip demonstrate no acute displaced fracture. The hip is located. Mild joint space narrowing, subchondral sclerosis and osteophyte formation is present, compatible with mild osteoarthritis  IMPRESSION: 1. No acute radiographic abnormality of the bony pelvis or the right hip. 2. Mild osteoarthritis of the right hip joint. 3. Extensive atherosclerosis.   Electronically Signed   By: Trudie Reed M.D.   On: 11/20/2013 13:19    Cardiac Studies:  Assessment/Plan:  New-onset A. fib with RVR  Possible tachycardia induced mild cardiomyopathy  Hypertension  History of CVA  PAD status post right BKA  Status post altered mental status  Dementia  History of seizure disorder  History of DVT  Plan Continue present management Reduce amiodarone to 200 mg twice daily after one week and then 200 mg by mouth daily  LOS: 4 days    Ann [name] N 11/22/2013, 10:52 AM

## 2013-11-22 NOTE — Care Management Note (Addendum)
   CARE MANAGEMENT NOTE 11/22/2013  Patient:  Ann [name], Ann [name]   Account Number:  [id number]  Date Initiated:  11/20/2013  Documentation initiated by:  Lizabeth Leyden  Subjective/Objective Assessment:   admitted with AMS, s/p fall, c/o hip pain.     Action/Plan:   discharge to home with:  HHPT, hospital bed, BSC, wheelchair, hoyer lift,   Anticipated DC Date:  11/22/2013   Anticipated DC Plan:  [place]  CM consult      [facility] Choice  HOME HEALTH   Choice offered to / List presented to:  C-4 Adult Children   DME arranged  3-N-1  [place] ordered.  DME agency  [place] arranged  HH-1 RN  [place] THERAPY  HH-6 SOCIAL WORKER      [place] agency  [place]   Status of service:  Completed, signed off Medicare Important Message given?   (If response is "NO", the following Medicare IM given date fields will be blank) Date Medicare IM given:   Date Additional Medicare IM given:    Discharge Disposition:  [place]  Per UR Regulation:    If discussed at Long Length of Stay Meetings, dates discussed:    Comments:  11/22/2013 Preparing for d/c, Alvis Lemmings contacted and info faxed for [facility] services, [place], [place], [place], [place] aide, [place] speech and Education officer, museum, requested. [facility] notifed of DME orders. Will continue to follow. Jasmine Pang RN MPH, case manager, [phone]  11/20/2013  [place], [place] [phone]  Met with daughter to discuss discharge planning. She states the patient was at [facility], the plan was to discharge to home on 11/19/2013. She is not able to afford to return to [facility] and will take her mother home. The family is able to provide 24 hour assistance.  The family chose [place] for home health services.  Alvis Lemmings called with referral for home health RN, PT, OT, aide and SW.

## 2013-11-22 NOTE — Progress Notes (Signed)
Speech Language Pathology Treatment: Dysphagia  Patient Details Name: Ann [name] MRN: [id number] DOB: 09-15-1927 Today's Date: 11/22/2013 Time: 7290-2111 SLP Time Calculation (min): 15 min  Assessment / Plan / Recommendation Clinical Impression  Pt consumed very little of breakfast. SLP provided 4 oz of pudding magic cup interspersed with 4 oz of water, alternating bites and sips. Pt tolerated this well, though some attempt o belch were observed. PO intake likely to be poor. Continue to recommend current diet and precautions for presumed esophageal dysphagia and offer pt frequent small meals and snack through the day.    HPI HPI: Ann [name] is a 80 y.o. female presenting with fever, hip pain, and altered mental status . PMH is significant for dementia, seizures, CVA and remote alcohol abuse seen following admission to [facility] following hospitalization from 2/20-2/23/15 for Gangrenous R foot requiring R BKA, hx of DVT (not on anticoagulation), PAD, no known CAD or CHF.    Pertinent Vitals NA  SLP Plan  Continue with current plan of care    Recommendations Diet recommendations: Dysphagia 1 (puree);Thin liquid Liquids provided via: Cup;Straw Medication Administration: Crushed with puree Supervision: Staff to assist with self feeding Compensations: Slow rate;Small sips/bites;Follow solids with liquid Postural Changes and/or Swallow Maneuvers: Seated upright 90 degrees;Upright 30-60 min after meal              Oral Care Recommendations: Oral care BID Follow up Recommendations: 24 hour supervision/assistance Plan: Continue with current plan of care    GO    [facility], MA CCC-SLP [phone]  Claudine Mouton 11/22/2013, 12:00 PM

## 2013-11-22 NOTE — Consult Note (Signed)
I have reviewed this case with our NP and agree with the Assessment and Plan as stated.  Polk Minor L. Ashkan Chamberland, MD MBA The Palliative Medicine Team at [place] Team Phone: [phone] Pager: [phone]   

## 2013-11-22 NOTE — Progress Notes (Signed)
Progress Note from the Palliative Medicine Team at [facility]  Subjective: Ann [name] is sleeping and awakens when I enter the room. She smiles and is pleasantly confused. She is not in any distress and appears comfortable. No family is in the room. She denies any pain or discomfort. She is planned to go home today under the care of her daughter, granddaughter, and nephew with home health. I left a hospice brochure on the table for daughter, [name], as I had told her I would and instructed her on self referral yesterday. I was unable to call Marchelle Folksmanda as I had planned before her shift this afternoon. If the patient does not discharge today I will speak with her tomorrow. I will continue to follow and support Ms. Dezarn for her hospital stay.    Objective: No Known Allergies Scheduled Meds: . amiodarone  400 mg Oral BID  . atorvastatin  20 mg Oral Daily  . carBAMazepine  150 mg Oral QID  . carvedilol  3.125 mg Oral BID WC  . doxycycline  100 mg Oral BID  . lisinopril  5 mg Oral Daily  . memantine  10 mg Oral BID  . rivaroxaban  15 mg Oral Q24H  . sodium chloride  3 mL Intravenous Q12H   Continuous Infusions: . sodium chloride 50 mL/hr (11/21/13 0700)   PRN Meds:.acetaminophen, acetaminophen, bisacodyl, metoprolol  BP 102/78  Pulse 102  Temp(Src) 97.9 F (36.6 C) (Oral)  Resp 19  Ht 5\' 1"  (1.549 m)  Wt 62.9 kg (138 lb 10.7 oz)  BMI 26.21 kg/m2  SpO2 100%   PPS: 30%  Pain Score: denies    Intake/Output Summary (Last 24 hours) at 11/22/13 1051 Last data filed at 11/21/13 1800  Gross per 24 hour  Intake    920 ml  Output      0 ml  Net    920 ml      LBM: 11/21/13      Physical Exam:  General: NAD, chronically ill appearing, frail  HEENT: + temporal muscle wasting  Chest: CTA throughout, no labored breathing, symmetric  CVS: Irreg, S1 S2  Abdomen: Soft, NT, ND, +BS  Ext: Rt BKA (~231 month old), warm to touch, no edema  Neuro: Alert, confused, oriented to person  only  Labs: CBC    Component Value Date/Time   WBC 5.9 11/21/2013 0944   RBC 3.93 11/21/2013 0944   HGB 11.3* 11/21/2013 0944   HCT 34.8* 11/21/2013 0944   PLT 147* 11/21/2013 0944   MCV 88.5 11/21/2013 0944   MCH 28.8 11/21/2013 0944   MCHC 32.5 11/21/2013 0944   RDW 14.0 11/21/2013 0944   LYMPHSABS 0.7 11/18/2013 2055   MONOABS 0.9 11/18/2013 2055   EOSABS 0.0 11/18/2013 2055   BASOSABS 0.0 11/18/2013 2055    BMET    Component Value Date/Time   NA 146 11/19/2013 0530   K 4.3 11/19/2013 0530   CL 107 11/19/2013 0530   CO2 25 11/19/2013 0530   GLUCOSE 96 11/19/2013 0530   BUN 32* 11/19/2013 0530   CREATININE 0.91 11/19/2013 0530   CREATININE 1.11* 02/28/2013 1652   CALCIUM 8.8 11/19/2013 0530   GFRNONAA 57* 11/19/2013 0530   GFRAA 66* 11/19/2013 0530    CMP     Component Value Date/Time   NA 146 11/19/2013 0530   K 4.3 11/19/2013 0530   CL 107 11/19/2013 0530   CO2 25 11/19/2013 0530   GLUCOSE 96 11/19/2013 0530   BUN 32*  11/19/2013 0530   CREATININE 0.91 11/19/2013 0530   CREATININE 1.11* 02/28/2013 1652   CALCIUM 8.8 11/19/2013 0530   PROT 7.6 10/26/2013 1126   ALBUMIN 3.1* 10/26/2013 1126   AST 48* 10/26/2013 1126   ALT 41* 10/26/2013 1126   ALKPHOS 126* 10/26/2013 1126   BILITOT 0.4 10/26/2013 1126   GFRNONAA 57* 11/19/2013 0530   GFRAA 66* 11/19/2013 0530    Assessment and Plan: 1. Code Status: FULL 2. Symptom Control:  1. Bowel Regimen: Dulcolax supp prn.  2. Fever: Acetaminophen prn.  3. Weakness: PT following. 3. Psycho/Social: Emotional support provided to patient.  4. Disposition: Home with home health. Family educated on self referral for hospice care.    Time In Time Out Total Time Spent with Patient Total Overall Time  1100 1125     Greater than 50%  of this time was spent counseling and coordinating care related to the above assessment and plan.  Yong Channel, NP Palliative Medicine Team Pager # [phone] (M-F 8a-5p) Team Phone # [phone]  (Nights/Weekends)    1

## 2013-11-22 NOTE — Progress Notes (Signed)
FMTS Attending Note  I personally saw and evaluated the patient. The plan of care was discussed with the resident team. I agree with the assessment and plan as documented by the resident.   Patient started on Coreg, Amiodarone, lisinopril, and Xarelto by Cardiology for new onset atrial fibrillation. Pharmacy noted interaction between Carbamazepine and Xarelto. Will discuss with family their wishes to continue full anticoagulation in light of other medical conditions. If full anticoagulation is to be continued may need to transition to Warfarin.   Discharge planning - patient is to return home with family with home health. Anticipate discharge home in next 24 hours.  Donnella Sham MD

## 2013-11-22 NOTE — Progress Notes (Signed)
FPTS Interim Progress Note  Subjective: Called by nurse for potential swelling of patient's upper lip. No difficulty with breathing, tongue does not appear to be swollen. On arrival patient is accompanied by family member who mentions that she noticed some left upper lip swelling; asked specifically about if her lower lips appeared more swollen and she does not say they look different. Patient does have some cough, but family member says she had that before admission.  Objective: BP 86/66  Pulse 98  Temp(Src) 98.4 F (36.9 C) (Oral)  Resp 18  Ht 5\' 1"  (1.549 m)  Wt 138 lb 10.7 oz (62.9 kg)  BMI 26.21 kg/m2  SpO2 94% at 12:08  General: NAD, eating/drinking normally HEENT: Appears to have swelling worse on lower right lip, however family member denies this. No noted tongue swelling, mouth is clear. Resp: CTAB, normal WOB Ext: R BKA, no edema in left lower leg noted  See picture below  A/P: Patient started on lisinopril, amiodarone 2 days ago. Concern for angioedema from lisinopril (less likely from amiodarone). No evidence of respiratory compromise at this time. Will re-evaluate later this evening, may need to consider stopping lisinopril if edema worsens.  Tawni Carnes, MD 11/22/2013, 2:49 PM PGY-1, [facility] Family Medicine FPTS Intern Pager: [phone], text pages welcome

## 2013-11-23 DIAGNOSIS — E440 Moderate protein-calorie malnutrition: Secondary | ICD-10-CM

## 2013-11-23 DIAGNOSIS — R4182 Altered mental status, unspecified: Secondary | ICD-10-CM

## 2013-11-23 DIAGNOSIS — F068 Other specified mental disorders due to known physiological condition: Secondary | ICD-10-CM

## 2013-11-23 DIAGNOSIS — Z515 Encounter for palliative care: Secondary | ICD-10-CM

## 2013-11-23 DIAGNOSIS — R1310 Dysphagia, unspecified: Secondary | ICD-10-CM

## 2013-11-23 MED ORDER — AMIODARONE HCL 200 MG PO TABS
200.0000 mg | ORAL_TABLET | Freq: Two times a day (BID) | ORAL | Status: DC
  Filled 2013-11-23: qty 1

## 2013-11-23 MED ORDER — AMIODARONE HCL 200 MG PO TABS: 200.0000 mg | ORAL_TABLET | Freq: Two times a day (BID) | ORAL | Status: DC

## 2013-11-23 NOTE — Progress Notes (Signed)
Progress Note from the Palliative Medicine Team at [facility]  Subjective: Ann [name] is sitting up and eating her lunch today when I enter the room. She is pleasant and asking for Ann [name]. She has no pain and no complaints and says "I feel fine." I spoke via telephone with Leonette Monarch daughter at her request) to reinforce the discussion I had with Ann [name]. Ann [name] said that she spoke with [name] and understands but did not understand or agree with Ms. Monett being considered for hospice. I reminded Ann [name] that Ms. Verdugo has dementia which is a terminal illness that we are not able to stop from progressing and worsening and that hospice would be appropriate for her at some point. I told her that she and her mother [name]) should also discuss Ms. Steenhoek' code status and really consider if we should ask her to go through CPR and intubation when we know she has this chronic worsening dementia. Ann [name] was receptive to the conversation and seems to consider the information I provided. She says that she thinks it is a good idea for her and her mother to continue to discuss code status. I also reminded Ann [name] that Ms. Koupal is on a pureed diet now and to prepare for that at home. Ann [name] had no further questions or concerns.    Objective: No Known Allergies Scheduled Meds: . amiodarone  400 mg Oral BID  . atorvastatin  20 mg Oral Daily  . carBAMazepine  150 mg Oral QID  . carvedilol  3.125 mg Oral BID WC  . doxycycline  100 mg Oral BID  . lisinopril  5 mg Oral Daily  . memantine  10 mg Oral BID  . rivaroxaban  15 mg Oral Q24H  . sodium chloride  3 mL Intravenous Q12H   Continuous Infusions:  PRN Meds:.acetaminophen, acetaminophen, bisacodyl, metoprolol  BP 99/72  Pulse 83  Temp(Src) 97.9 F (36.6 C) (Oral)  Resp 17  Ht 5\' 1"  (1.549 m)  Wt 64.4 kg (141 lb 15.6 oz)  BMI 26.84 kg/m2  SpO2 100%   PPS: 30%     Intake/Output Summary (Last 24 hours) at 11/23/13 1325 Last data filed at 11/23/13  0768  Gross per 24 hour  Intake 640.83 ml  Output      0 ml  Net 640.83 ml      LBM: 11/21/13      Physical Exam:  General: NAD, chronically ill appearing, frail  HEENT: + temporal muscle wasting  Chest: CTA throughout, no labored breathing, symmetric  CVS: Irreg, S1 S2  Abdomen: Soft, NT, ND, +BS  Ext: Rt BKA (~40 month old), warm to touch, no edema  Neuro: Alert, confused, oriented to person only   Labs: CBC    Component Value Date/Time   WBC 5.9 11/21/2013 0944   RBC 3.93 11/21/2013 0944   HGB 11.3* 11/21/2013 0944   HCT 34.8* 11/21/2013 0944   PLT 147* 11/21/2013 0944   MCV 88.5 11/21/2013 0944   MCH 28.8 11/21/2013 0944   MCHC 32.5 11/21/2013 0944   RDW 14.0 11/21/2013 0944   LYMPHSABS 0.7 11/18/2013 2055   MONOABS 0.9 11/18/2013 2055   EOSABS 0.0 11/18/2013 2055   BASOSABS 0.0 11/18/2013 2055    BMET    Component Value Date/Time   NA 146 11/19/2013 0530   K 4.3 11/19/2013 0530   CL 107 11/19/2013 0530   CO2 25 11/19/2013 0530   GLUCOSE 96 11/19/2013 0530   BUN 32* 11/19/2013 0530   CREATININE  0.91 11/19/2013 0530   CREATININE 1.11* 02/28/2013 1652   CALCIUM 8.8 11/19/2013 0530   GFRNONAA 57* 11/19/2013 0530   GFRAA 66* 11/19/2013 0530    CMP     Component Value Date/Time   NA 146 11/19/2013 0530   K 4.3 11/19/2013 0530   CL 107 11/19/2013 0530   CO2 25 11/19/2013 0530   GLUCOSE 96 11/19/2013 0530   BUN 32* 11/19/2013 0530   CREATININE 0.91 11/19/2013 0530   CREATININE 1.11* 02/28/2013 1652   CALCIUM 8.8 11/19/2013 0530   PROT 7.6 10/26/2013 1126   ALBUMIN 3.1* 10/26/2013 1126   AST 48* 10/26/2013 1126   ALT 41* 10/26/2013 1126   ALKPHOS 126* 10/26/2013 1126   BILITOT 0.4 10/26/2013 1126   GFRNONAA 57* 11/19/2013 0530   GFRAA 66* 11/19/2013 0530    Assessment and Plan: 1. Code Status: FULL  2. Symptom Control:  1. Bowel Regimen: Dulcolax supp prn.  2. Fever: Acetaminophen prn.  3. Weakness: PT following. 3. Psycho/Social: Emotional support provided to patient.   4. Disposition: Home with home health.     Time In Time Out Total Time Spent with Patient Total Overall Time  1245 1315 15min 30min    Greater than 50%  of this time was spent counseling and coordinating care related to the above assessment and plan.  Yong ChannelAlicia Scheryl Sanborn, NP Palliative Medicine Team Pager # [phone] (M-F 8a-5p) Team Phone # [phone] (Nights/Weekends)

## 2013-11-23 NOTE — Clinical Social Work Note (Signed)
Patient medically stable for discharge and requested ambulance transport home. CSW facilitated transport via [facility].  Genelle Bal, MSW, LCSW [phone]

## 2013-11-23 NOTE — Progress Notes (Signed)
Patient discharged home with home health. Patient was discharged by ambulance. Patients daughter was given discharge instructions and information on prescriptions. Daughter was given patients belongings. Patient was stable upon discharge.

## 2013-11-23 NOTE — Progress Notes (Signed)
Subjective:   awake in no acute distress amiodarone held because of low blood pressure.  Noted to have mild lip swelling no shortness of breath or wheezing  Objective:  Vital Signs in the last 24 hours: Temp:  [97.4 F (36.3 C)-98.1 F (36.7 C)] 97.9 F (36.6 C) (03/20 0800) Pulse Rate:  [78-83] 83 (03/20 0800) Resp:  [17-18] 17 (03/20 0800) BP: (93-104)/(69-78) 99/72 mmHg (03/20 1043) SpO2:  [100 %] 100 % (03/20 0800) Weight:  [64.4 kg (141 lb 15.6 oz)] 64.4 kg (141 lb 15.6 oz) (03/19 2012)  Intake/Output from previous day: 03/19 0701 - 03/20 0700 In: 640.8 [P.O.:60; I.V.:580.8] Out: -  Intake/Output from this shift:    Physical Exam: Neck: no adenopathy, no carotid bruit, no JVD and supple, symmetrical, trachea midline Lungs: clear to auscultation bilaterally Heart: irregularly irregular rhythm, S1, S2 normal and soft systolic and diastolic murmur noted Abdomen: soft, non-tender; bowel sounds normal; no masses,  no organomegaly Extremities: right BKA  Lab Results:  Recent Labs  11/21/13 0944  WBC 5.9  HGB 11.3*  PLT 147*   No results found for this basename: NA, K, CL, CO2, GLUCOSE, BUN, CREATININE,  in the last 72 hours  Recent Labs  11/21/13 1105  TROPONINI <0.30   Hepatic Function Panel No results found for this basename: PROT, ALBUMIN, AST, ALT, ALKPHOS, BILITOT, BILIDIR, IBILI,  in the last 72 hours No results found for this basename: CHOL,  in the last 72 hours No results found for this basename: PROTIME,  in the last 72 hours  Imaging: Imaging results have been reviewed and No results found.  Cardiac Studies:  Assessment/Plan:  New-onset A. fib with RVR  Possible tachycardia induced mild cardiomyopathy  Hypertension  History of CVA  PAD status post right BKA  Status post altered mental status  Dementia  History of seizure disorder  History of DVT  Possible angioedema Plan DC lisinopril Reduce amiodarone to twice daily  LOS: 5  days    Ann [name] N 11/23/2013, 2:29 PM

## 2013-11-23 NOTE — Progress Notes (Signed)
Family Medicine Teaching Service Daily Progress Note Intern Pager: [phone]  Patient name: Ann NephewDaisy M [name] Medical record number: [id number] Date of birth: [birth year] Age: 80 y.o. Gender: female  Primary Care Provider: Rodman PickleHAIRFORD, AMBER, MD Consultants: none Code Status: full  Pt Overview and Major Events to Date:  3/15 - admitted with fever, AMS  Assessment and Plan: Ann NephewDaisy M [name] is a 80 y.o. female presenting with fever, hip pain, and altered mental status . PMH is significant for dementia, seizures, CVA and remote alcohol abuse seen following admission to [facility] following hospitalization from 2/20-2/23/15 for Gangrenous R foot requiring R BKA, hx of DVT (not on anticoagulation), PAD, no known CAD or CHF.   #New onset Afib/PAF: No previous evidence on EKG's in her chart.  - dilt bolus; may need to bolus more than once to control heart rate - coreg, amiodarone, lisinopril per cards  - xarelto started 3/18 per cards - consider decreasing rate control, frequent need to hold meds for bradycardia and hypotension, will discuss with cards prior to making changes  #Altered Mental Status: improved since admission. Appears she is at her baseline mentation.  Afebrile since admission.  - Head CT showed remote infarct, no acute findings - if febrile again, will start Vanc/Zosyn  - dysphagia 1 diet, thin liquids   #Lip swelling/concern for angioedema: exam benign, no overt change to lip size - concern for possible angioedema with lisinopril added recently - consider switch to losartan, no respiratory compromise or tongue swelling - continue to monitor for now  #Recent fall:  hip effusion on CT - strict bed rest  - Ortho recs: started doxy, use ace wrap for compression.   #PAD s/p R BKA: Currently residing in nursing home for acute rehab following the surgery. Surgery occurred due to gangrene of the foot with known non-interventional peripheral vascular disease by Dr. Lajoyce Cornersuda.  -  lipitor 20 mg  - hold home tramadol due to hx of seizures  - continue doxycycline x1 month  #Dementia, severe: On review of chart there is no documented MMSE. Currently on namenda.  - Namenda 10 mg BID   #HTN: currently well controlled. - stopped home amlodipine, hctz, metop - coreg 3.125 bid - lisinopril 5mg  daily   #Seizures: In reviewing chart, last known seizure was 05/2013. Last carbamazepine level in EPIC was 7.1 on 02/19/13.  - CT head as above  - Seizure precautions.  - Carbamezepine 300 mg BID   FEN/GI: SLIV, dysphagia 1 diet  Prophylaxis: SCD's   Disposition: home today with home health  Subjective: patient appears to be at her baseline mentally. She denies any pain this am and nods when asked if she would like to go home.  Concern for lip swelling yesterday, nothing on exam.  Objective: Temp:  [97.4 F (36.3 C)-98.4 F (36.9 C)] 98.1 F (36.7 C) (03/20 0513) Pulse Rate:  [78-102] 79 (03/20 0513) Resp:  [17-19] 18 (03/20 0513) BP: (86-104)/(66-78) 104/78 mmHg (03/20 0513) SpO2:  [94 %-100 %] 100 % (03/20 0513) Weight:  [141 lb 15.6 oz (64.4 kg)] 141 lb 15.6 oz (64.4 kg) (03/19 2012) Physical Exam: General: NAD, African [place] female, Elderly, frail, lying in bed,  HEENT: Bell/AT, MMM Cardiovascular: irregularly irregular rhythm, normal rate, no murmurs, clicks or gallops  Respiratory: CTAB, no wheezes, crackles, or rhonchi  Abdomen: soft, NTND, +BS, no HSM  Extremities: R BKA: postop incision clean dry and intact, no drainage, no warmth or erythema. Skin: no rashes  Neuro: only oriented  to self   Laboratory:  Recent Labs Lab 11/19/13 0530 11/20/13 0734 11/21/13 0944  WBC 7.0 5.4 5.9  HGB 11.1* 10.4* 11.3*  HCT 33.4* 32.3* 34.8*  PLT 172 166 147*    Recent Labs Lab 11/18/13 2110 11/19/13 0530  NA 143 146  K 3.8 4.3  CL 103 107  CO2  --  25  BUN 35* 32*  CREATININE 1.10 0.91  CALCIUM  --  8.8  GLUCOSE 110* 96   BCx: NGTD UCx: No growth  final  Imaging/Diagnostic Tests: CXR:  1. Stable cardiomegaly with tortuous aorta and great vessels.  2. Stable basilar atelectasis and/or scarring.   CT right hip w/out contrast:  1. No acute abnormality of the right hip. Mild arthritic changes.  2. Severe spinal stenosis at L4-5 and L5-S1 with right foraminal stenosis of both levels.  CT head: 1. No acute intracranial process identified.  2. Remote right MCA territory infarct.  3. Moderate atrophy and chronic microvascular ischemic disease.  Beverely Low, MD 11/23/2013, 7:22 AM PGY-1, [facility] Family Medicine FPTS Intern pager: [phone], text pages welcome

## 2013-11-23 NOTE — Discharge Instructions (Signed)
Fall Prevention and Home Safety Falls cause injuries and can affect all age groups. It is possible to prevent falls.  HOW TO PREVENT FALLS  Wear shoes with rubber soles that do not have an opening for your toes.  Keep the inside and outside of your house well lit.  Use night lights throughout your home.  Remove clutter from floors.  Clean up floor spills.  Remove throw rugs or fasten them to the floor with carpet tape.  Do not place electrical cords across pathways.  Put grab bars by your tub, shower, and toilet. Do not use towel bars as grab bars.  Put handrails on both sides of the stairway. Fix loose handrails.  Do not climb on stools or stepladders, if possible.  Do not wax your floors.  Repair uneven or unsafe sidewalks, walkways, or stairs.  Keep items you use a lot within reach.  Be aware of pets.  Keep emergency numbers next to the telephone.  Put smoke detectors in your home and near bedrooms. Ask your doctor what other things you can do to prevent falls. Document Released: 06/19/2009 Document Revised: 02/22/2012 Document Reviewed: 11/23/2011 [facility] Patient Information 2014 [place], [place]. Information on my medicine - XARELTO (Rivaroxaban)  This medication education was reviewed with me or my healthcare representative as part of my discharge preparation.  The pharmacist that spoke with me during my hospital stay was:  Woodfin Ganja, [facility]  Why was Xarelto prescribed for you? Xarelto was prescribed for you to reduce the risk of a blood clot forming that can cause a stroke if you have a medical condition called atrial fibrillation (a type of irregular heartbeat).  What do you need to know about xarelto ? Take your Xarelto ONCE DAILY at the same time every day with your evening meal. If you have difficulty swallowing the tablet whole, you may crush it and mix in applesauce just prior to taking your dose.  Take Xarelto exactly as prescribed by your  doctor and DO NOT stop taking Xarelto without talking to the doctor who prescribed the medication.  Stopping without other stroke prevention medication to take the place of Xarelto may increase your risk of developing a clot that causes a stroke.  Refill your prescription before you run out.  After discharge, you should have regular check-up appointments with your healthcare provider that is prescribing your Xarelto.  In the future your dose may need to be changed if your kidney function or weight changes by a significant amount.  What do you do if you miss a dose? If you are taking Xarelto ONCE DAILY and you miss a dose, take it as soon as you remember on the same day then continue your regularly scheduled once daily regimen the next day. Do not take two doses of Xarelto at the same time or on the same day.   Important Safety Information A possible side effect of Xarelto is bleeding. You should call your healthcare provider right away if you experience any of the following:   Bleeding from an injury or your nose that does not stop.   Unusual colored urine (red or dark brown) or unusual colored stools (red or black).   Unusual bruising for unknown reasons.   A serious fall or if you hit your head (even if there is no bleeding).  Some medicines may interact with Xarelto and might increase your risk of bleeding while on Xarelto. To help avoid this, consult your healthcare provider or pharmacist prior to  using any new prescription or non-prescription medications, including herbals, vitamins, non-steroidal anti-inflammatory drugs (NSAIDs) and supplements.  This website has more information on Xarelto: VisitDestination.com.br.

## 2013-11-23 NOTE — Discharge Summary (Signed)
Ann [name] Discharge Summary  Patient name: Ann [name] Medical record number: [id number] Date of birth: 05-16-1928 Age: 80 y.o. Gender: female Date of Admission: 11/18/2013  Date of Discharge: 11/23/2013  Admitting Physician: Dickie La, MD  Primary Care Provider: Melrose Nakayama, MD Consultants: cardiology, palliative care  Indication for Hospitalization: fever, AMS  Discharge Diagnoses/Problem List:  Patient Active Problem List   Diagnosis Date Noted  . A-fib 11/21/2013  . Moderate malnutrition 11/20/2013  . Altered mental status 11/19/2013  . Nursing home resident 10/31/2013  . S/P BKA (below knee amputation) unilateral 10/26/2013  . Shoulder mass 10/07/2011  . Keratosis of plantar aspect of foot 12/23/2010  . BACK PAIN 12/13/2008  . BUNIONS, BILATERAL 08/06/2008  . HYPERCHOLESTEROLEMIA 11/03/2006  . DEMENTIA, NOT SPECIFIED 11/03/2006  . ALCOHOL ABUSE, UNSPECIFIED 11/03/2006  . CATARACT 11/03/2006  . HEARING LOSS NOS OR DEAFNESS 11/03/2006  . HYPERTENSION, BENIGN SYSTEMIC 11/03/2006  . CVA 11/03/2006  . GASTROESOPHAGEAL REFLUX, NO ESOPHAGITIS 11/03/2006  . CONSTIPATION 11/03/2006  . CONVULSIONS, SEIZURES, NOS 11/03/2006  . INCONTINENCE, URGE 11/03/2006    Disposition: home with home health  Discharge Condition: improved  Brief [name] Course: Ann [name] is a 80 y.o. female presenting with fever, hip pain, and altered mental status . PMH is significant for dementia, seizures, CVA and remote alcohol abuse seen following admission to [facility] following hospitalization from 2/20-2/23/15 for Gangrenous R foot requiring R BKA, hx of DVT (not on anticoagulation), PAD, no known CAD or CHF.   #New onset Afib/PAF: During her hospitalization the patient developed atrial fibrillation with RVR. She was seen by cardiology and started on coreg, amiodarone and lisinopril. She was somewhat hypotensive on this regimen so she was  discharged on a lower dose of amiodarone (274m bid) and the lisinopril was discontinued. She was also started on zarelto for anticoagulation with the understanding that her carbamazepine may decrease the effectiveness of this therapy to some extent but that this was preferable to coumadin or changing her antiepileptic.  #Altered Mental Status: Delirium vs. worsening of baseline dementia. Pt improved with monitoring. She had a head CT which did not show any acute changes. She was febrile on admission but not at all after that and did not receive antibiotics. She had no other signs of infection. She refused PO meds and all food for the first few days. She received speech and nutrition consults and gradually improved. She was tolerating a pureed diet with crushed pills in apple sauce at the time of discharge.  #Lip swelling/concern for angioedema: The family complained of upper left lip swelling after starting lisinopril. Her lips appeared unchanged on my exam. She had no tongue swelling or respiratory compromise. Lisinopril was stopped on day of discharge. exam benign, no overt change to lip size   #PAD s/p R BKA and hip pain on admission: The patient was reported to have some pain with movement of her right hip joint. Imaging was negative for fracture. A small effusion was attributed to chronic arthritic changes. Orthopedics was consulted and did not think her exam was consist with a septic hip. Her BKA appeared to be healing well but she was started on doxycycline per ortho recs for some possible contribution of this wound to her fever. The plan was to continue her on doxy for 1 month.  #Dementia, severe: Palliative was consulted and met with the family during her stay as with her minimal communication, tenuous nutritional status and non-ambulation she is  likely end-stage dementia at this point. The family was receptive to discussion about changing code status and possible hospice in the future but no  changes were made during her stay.  - lisinopril 67m daily   #Seizures: In reviewing chart, last known seizure was 05/2013. Due to her inability to take pills she was transitioned off the extended release carbamazepine which could not be crushed, to 4 times daily dosing of the immediate release. She was given this in liquid form as this was easier on nursing and likely on the family as well. She had no seizure activity during her stay.  Issues for Follow Up:  # ?Angioedema: Her exam was very unimpressive and I do not believe her reported lip swelling was truly angioedema. It would be reasonable to start her an ARB for her heart failure if/when her BP allows.  # BKA: She should follow-up with DSharol Givenprior to her stopping doxycycline on April 15.  # Nutrition: Follow up the family's ability to adequately feed her with pureed foods and get her to take her meds. Reinforce that this will likely become more difficult and hospice would be very helpful...  Significant Procedures: none  Significant Labs and Imaging:   Recent Labs Lab 11/19/13 0530 11/20/13 0734 11/21/13 0944  WBC 7.0 5.4 5.9  HGB 11.1* 10.4* 11.3*  HCT 33.4* 32.3* 34.8*  PLT 172 166 147*    Recent Labs Lab 11/18/13 2110 11/19/13 0530  NA 143 146  K 3.8 4.3  CL 103 107  CO2  --  25  GLUCOSE 110* 96  BUN 35* 32*  CREATININE 1.10 0.91  CALCIUM  --  8.8   BCx: NGTD  UCx: No growth final   CXR:  1. Stable cardiomegaly with tortuous aorta and great vessels.  2. Stable basilar atelectasis and/or scarring.   CT right hip w/out contrast:  1. No acute abnormality of the right hip. Mild arthritic changes.  2. Severe spinal stenosis at L4-5 and L5-S1 with right foraminal stenosis of both levels.   CT head:  1. No acute intracranial process identified.  2. Remote right MCA territory infarct.  3. Moderate atrophy and chronic microvascular ischemic disease.   Results/Tests Pending at Time of Discharge: none  Discharge  Medications:    Medication List    STOP taking these medications       amLODipine 10 MG tablet  Commonly known as:  NORVASC     aspirin 81 MG tablet     carbamazepine 300 MG 12 hr capsule  Commonly known as:  CARBATROL  Replaced by:  carBAMazepine 100 MG/5ML suspension     hydrochlorothiazide 25 MG tablet  Commonly known as:  HYDRODIURIL     metoprolol succinate 50 MG 24 hr tablet  Commonly known as:  TOPROL-XL      TAKE these medications       amiodarone 200 MG tablet  Commonly known as:  PACERONE  Take 1 tablet (200 mg total) by mouth 2 (two) times daily.     atorvastatin 20 MG tablet  Commonly known as:  LIPITOR  Take 1 tablet (20 mg total) by mouth daily.     bisacodyl 10 MG suppository  Commonly known as:  DULCOLAX  Place 10 mg rectally daily as needed for moderate constipation.     carBAMazepine 100 MG/5ML suspension  Commonly known as:  TEGRETOL  Take 7.5 mLs (150 mg total) by mouth 4 (four) times daily.     carvedilol 3.125 MG tablet  Commonly known as:  COREG  Take 1 tablet (3.125 mg total) by mouth 2 (two) times daily with a meal.     doxycycline 50 MG/5ML Syrp  Commonly known as:  VIBRAMYCIN  Take 10 mLs (100 mg total) by mouth 2 (two) times daily.     magnesium hydroxide 800 MG/5ML suspension  Commonly known as:  MILK OF MAGNESIA  Take 30 mLs by mouth daily as needed for constipation.     memantine 10 MG tablet  Commonly known as:  NAMENDA  Take 1 tablet (10 mg total) by mouth 2 (two) times daily.     RA SALINE ENEMA 19-7 GM/118ML Enem  Place 1 each rectally daily as needed (for severe constipation not relieved by Bisacodyl).     Rivaroxaban 15 MG Tabs tablet  Commonly known as:  XARELTO  Take 1 tablet (15 mg total) by mouth daily.     traMADol 50 MG tablet  Commonly known as:  ULTRAM  Take 50 mg by mouth every 6 (six) hours as needed.        Discharge Instructions: Please refer to Patient Instructions section of EMR for full details.   Patient was counseled important signs and symptoms that should prompt return to medical care, changes in medications, dietary instructions, activity restrictions, and follow up appointments.   Follow-Up Appointments:     Follow-up Information   Follow up with Melrose Nakayama, MD On 11/29/2013. (8:45am)    Specialty:  Family Medicine   Contact information:   [address] [place] [address] [phone]       Follow up with [facility]. (Will provide HHRN, HHPT, HHOT, [place] aide, speech and social worker.)    Specialty:  Home Health Services   Contact information:   [address] [place] [address] [phone]       Follow up with Clent Demark, MD. Schedule an appointment as soon as possible for a visit in 1 month.   Specialty:  Cardiology   Contact information:   [place] [address] [phone]       Beverlyn Roux, MD 11/23/2013, 10:19 PM PGY-1, [place]

## 2013-11-23 NOTE — Progress Notes (Signed)
Patient evaluated for community based chronic disease management services with [facility] Care Management Program as a benefit of patient's [employer]. Attempted to call patient's daughter [name] at bedside to explain [facility] Care Management services.  Unable to make contact via phone number.  Left [facility] literature at bedside and requested that staff nurse and RNCM have the daughter call before discharge.  Made Inpatient Case Manager aware that [facility] Care Management following. Of note, [facility] Care Management services does not replace or interfere with any services that are arranged by inpatient case management or social work.  For additional questions or referrals please contact Anibal Henderson BSN RN [facility] Liaison at [phone].

## 2013-11-23 NOTE — Progress Notes (Signed)
FMTS ATTENDING  NOTE Kennith Morss,MD I  have seen and examined this patient, reviewed their chart. I have discussed this patient with the resident. I agree with the resident's findings, assessment and care plan. 

## 2013-11-24 NOTE — Discharge Summary (Signed)
FMTS ATTENDING  NOTE Lucciano Vitali,MD I  have seen and examined this patient, reviewed their chart. I have discussed this patient with the resident. I agree with the resident's findings, assessment and care plan. 

## 2013-11-24 NOTE — Progress Notes (Signed)
I have reviewed this case with our NP and agree with the Assessment and Plan as stated.  Michal Callicott L. Marcene Laskowski, MD MBA The Palliative Medicine Team at  Team Phone: [phone] Pager: [phone]   

## 2013-11-25 LAB — CULTURE, BLOOD (ROUTINE X 2)
Culture: NO GROWTH
Culture: NO GROWTH

## 2013-11-26 ENCOUNTER — Telehealth: Payer: Self-pay | Admitting: *Deleted

## 2013-11-26 NOTE — Telephone Encounter (Signed)
Ann [name] called again about the letter

## 2013-11-26 NOTE — Telephone Encounter (Signed)
Prior Authorization received from [facility] for Xarelto 15 mg tab. Form completed my provider and faxed to [facility] for review. Clovis Pu, RN

## 2013-11-27 NOTE — Telephone Encounter (Signed)
I thought this would have been handled in the hospital since that was where pt was when [facility] requested letter.  I will be happy to write the letter but I cannot give specifics about her medical conditions without ROI form. I will write a vague letter stating she is ill but if [name] wants more details, there will have to be a release of information.  I will route the letter to the [facility] Admin team.  Thanks! Amber M. Hairford, M.D.

## 2013-11-28 ENCOUNTER — Telehealth: Payer: Self-pay | Admitting: Family Medicine

## 2013-11-28 NOTE — Telephone Encounter (Signed)
Error

## 2013-11-28 NOTE — Telephone Encounter (Signed)
Don called back from [place] to order a drop down commode chair that works with a sliding board. jw

## 2013-11-28 NOTE — Telephone Encounter (Signed)
Andres Shad from [facility] called and would like verbal orders for OT with [place]. He would like 2 times a week for 1 week and 1 time a week for 2 weeks. This would be for Training, home exercise, transfers, ADL, and adapting to equipment.  If you have any question please call him at [phone].jw

## 2013-11-28 NOTE — Telephone Encounter (Signed)
Ok for verbal order for all requests.  Can fax hard copy to our clinic if he needs an attending to sign orders.  Thanks, [employer]. Kemper Heupel, M.D.

## 2013-11-29 ENCOUNTER — Inpatient Hospital Stay: Payer: Medicare Other | Admitting: Family Medicine

## 2013-11-29 NOTE — Telephone Encounter (Signed)
PA for Xarelto 15 mg tab has been approved through 11-26-2014 from [facility]. [facility] notified.  Clovis Pu, RN

## 2013-11-29 NOTE — Telephone Encounter (Signed)
LMOVM for Mr Letitia Libra to call back.  Please relay the below message. [place], Maryjo Rochester

## 2013-11-29 NOTE — Telephone Encounter (Signed)
Called [facility] to see if they have a standard form for [place] to fill out and they don't. They need [place] to fax an order from here along with demographics and supporting documentation on why patient needs this.  Spoke with MD and pt will need a face to face appt since we have not seen in her in the office since she was discharged from hospital.  Tried to call daughter beulah to inform of this but had to leave a message.  Please let her know that in order to get insurance to cover this chair patient will need an appt.  They can pay out of pocket if they would like.  We will fax this order to  [facility] but there is no guarantee of insurance payment. Jazmin Hartsell,CMA

## 2013-11-29 NOTE — Telephone Encounter (Signed)
Spoke with Roe Coombs from [facility] and he states that [facility] is the company that will order her commode and they require a written order.  It needs to be for a drop down arm rest commode to accomodate sliding board for transfers.  States that patient did really well with transfer yesterday.  We can fax it to [phone].  Jazmin Hartsell,CMA

## 2013-12-03 ENCOUNTER — Emergency Department (HOSPITAL_COMMUNITY)
Admission: EM | Admit: 2013-12-03 | Discharge: 2013-12-03 | Disposition: A | Discharge status: Home / Self Care | Payer: Medicare Other | Attending: Emergency Medicine | Admitting: Emergency Medicine

## 2013-12-03 ENCOUNTER — Emergency Department (HOSPITAL_COMMUNITY): Payer: Medicare Other

## 2013-12-03 ENCOUNTER — Encounter (HOSPITAL_COMMUNITY): Payer: Self-pay | Admitting: Emergency Medicine

## 2013-12-03 ENCOUNTER — Telehealth: Payer: Self-pay | Admitting: Family Medicine

## 2013-12-03 DIAGNOSIS — Z8781 Personal history of (healed) traumatic fracture: Secondary | ICD-10-CM | POA: Insufficient documentation

## 2013-12-03 DIAGNOSIS — Z8673 Personal history of transient ischemic attack (TIA), and cerebral infarction without residual deficits: Secondary | ICD-10-CM | POA: Insufficient documentation

## 2013-12-03 DIAGNOSIS — G40909 Epilepsy, unspecified, not intractable, without status epilepticus: Secondary | ICD-10-CM | POA: Insufficient documentation

## 2013-12-03 DIAGNOSIS — Z7901 Long term (current) use of anticoagulants: Secondary | ICD-10-CM | POA: Insufficient documentation

## 2013-12-03 DIAGNOSIS — Z8669 Personal history of other diseases of the nervous system and sense organs: Secondary | ICD-10-CM | POA: Insufficient documentation

## 2013-12-03 DIAGNOSIS — S88119A Complete traumatic amputation at level between knee and ankle, unspecified lower leg, initial encounter: Secondary | ICD-10-CM | POA: Insufficient documentation

## 2013-12-03 DIAGNOSIS — R6889 Other general symptoms and signs: Secondary | ICD-10-CM | POA: Insufficient documentation

## 2013-12-03 DIAGNOSIS — R0989 Other specified symptoms and signs involving the circulatory and respiratory systems: Secondary | ICD-10-CM

## 2013-12-03 DIAGNOSIS — Z86718 Personal history of other venous thrombosis and embolism: Secondary | ICD-10-CM | POA: Insufficient documentation

## 2013-12-03 DIAGNOSIS — I10 Essential (primary) hypertension: Secondary | ICD-10-CM | POA: Insufficient documentation

## 2013-12-03 DIAGNOSIS — F0390 Unspecified dementia without behavioral disturbance: Secondary | ICD-10-CM | POA: Insufficient documentation

## 2013-12-03 DIAGNOSIS — E785 Hyperlipidemia, unspecified: Secondary | ICD-10-CM | POA: Insufficient documentation

## 2013-12-03 DIAGNOSIS — Z79899 Other long term (current) drug therapy: Secondary | ICD-10-CM | POA: Insufficient documentation

## 2013-12-03 DIAGNOSIS — R05 Cough: Secondary | ICD-10-CM

## 2013-12-03 DIAGNOSIS — R059 Cough, unspecified: Secondary | ICD-10-CM | POA: Insufficient documentation

## 2013-12-03 LAB — CBC WITH DIFFERENTIAL/PLATELET
BASOS ABS: 0 10*3/uL (ref 0.0–0.1)
BASOS PCT: 0 % (ref 0–1)
EOS ABS: 0.1 10*3/uL (ref 0.0–0.7)
EOS PCT: 3 % (ref 0–5)
HEMATOCRIT: 31.7 % — AB (ref 36.0–46.0)
Hemoglobin: 10.5 g/dL — ABNORMAL LOW (ref 12.0–15.0)
Lymphocytes Relative: 17 % (ref 12–46)
Lymphs Abs: 0.8 10*3/uL (ref 0.7–4.0)
MCH: 29.1 pg (ref 26.0–34.0)
MCHC: 33.1 g/dL (ref 30.0–36.0)
MCV: 87.8 fL (ref 78.0–100.0)
MONO ABS: 0.6 10*3/uL (ref 0.1–1.0)
Monocytes Relative: 14 % — ABNORMAL HIGH (ref 3–12)
Neutro Abs: 2.9 10*3/uL (ref 1.7–7.7)
Neutrophils Relative %: 66 % (ref 43–77)
PLATELETS: 176 10*3/uL (ref 150–400)
RBC: 3.61 MIL/uL — ABNORMAL LOW (ref 3.87–5.11)
RDW: 15.3 % (ref 11.5–15.5)
WBC: 4.5 10*3/uL (ref 4.0–10.5)

## 2013-12-03 LAB — BASIC METABOLIC PANEL
BUN: 18 mg/dL (ref 6–23)
CO2: 27 meq/L (ref 19–32)
CREATININE: 0.84 mg/dL (ref 0.50–1.10)
Calcium: 8.5 mg/dL (ref 8.4–10.5)
Chloride: 104 mEq/L (ref 96–112)
GFR calc Af Amer: 72 mL/min — ABNORMAL LOW (ref >90)
GFR calc non Af Amer: 63 mL/min — ABNORMAL LOW (ref >90)
Glucose, Bld: 88 mg/dL (ref 70–99)
Potassium: 3.7 mEq/L (ref 3.7–5.3)
SODIUM: 142 meq/L (ref 137–147)

## 2013-12-03 NOTE — ED Notes (Signed)
Attempted to transport patient via wheelchair.  She is unable to bear weight and requires extensive to total assistance to transfer.  Patient is unsafe to transport via pov due to her limitations.  Patient is also a new amputee 1 month post op.  [facility] has been called to transport.  Family also reports that recent therapist eval reports that patient is unsafe to transport by family.

## 2013-12-03 NOTE — Discharge Instructions (Signed)
It sounds like Ms. Pates is having some difficulty swallowing thin liquids at times. We recommend that she has her liquids thickened as described below in the discharge papers. Nutritional supplements such as Ensure are a good way to give her thickened liquids. She may also have things like soup. There are several other foods and liquids listed below that she may have. She can also eat solid foods as normal. We feel that she needs to be reassessed by her primary care physician this week.  Right now, it does not appear that she has pneumonia related to aspiration. However, if she were to develop fever, chills, increased confusion, shortness of breath, or other worrisome symptoms, please have her evaluated as soon as possible as it is possible that she could develop pneumonia. If you feel that his emergency you can return to the emergency department. Again she should be reevaluated by her primary care physician this week, preferably within the next 2-3 days. Choking, Adult Choking occurs when a food or object gets stuck in the throat or trachea, blocking the airway. If the airway is partly blocked, coughing will usually cause the food or object to come out. If the airway is completely blocked, immediate action is needed to help it come out. A complete airway blockage is life-threatening because it causes breathing to stop. Choking is a true medical emergency that requires fast, appropriate action by anyone available. SIGNS OF AIRWAY BLOCKAGE There is a partial airway blockage if you or the person who is choking is:   Able to breathe and speak.  Coughing loudly.  Making loud noises. There is a complete airway blockage if you or the person who is choking is:   Unable to breathe.  Making soft or high-pitched sounds while breathing.  Unable to cough or coughing weakly, ineffectively, or silently.  Unable to cry, speak, or make sounds.  Turning blue.  Holding the neck with both arms. This is the  universal sign of choking. WHAT TO DO IF CHOKING OCCURS If there is a partial airway blockage, allow coughing to clear the airway. Do not try to drink until the food or object comes out. If someone else has a partial airway blockage, do not interfere. Stay with him or her and watch for signs of complete airway blockage until the food or object comes out.  If there is a complete airway blockage or if there is a partial airway blockage and the food or object does not come out, perform abdominal thrusts (also referred to as the Heimlich maneuver). Abdominal thrusts are used to create an artificial cough to try to clear the airway. Performing abdominal thrusts is part of a series of steps that should be done to help someone who is choking. Abdominal thrusts are usually done by someone else, but if you are alone, you can perform abdominal thrusts on yourself. Follow the procedure below that best fits your situation.  IF SOMEONE ELSE IS CHOKING: For a conscious adult:  1. Ask the person whether he or she is choking. If the person nods, continue to step 2. 2. Stand or kneel behind the person and lean him or her forward slightly. 3. Make a fist with 1 hand, put your arms around the person, and grasp your fist with your other hand. Place the thumb side of your fist in the person's abdomen, just below the ribs. 4. Press inward and upward with both hands. 5. Repeat this maneuver until the object comes out and the person is  able to breathe or until the person loses consciousness. For an unconcious adult: 1. Shout for help. If someone responds, have him or her call local emergency services (911 in [place] If no one responds, call local emergency services yourself if possible. 2. Begin CPR, starting with compressions. Every time you open the airway to give rescue breaths, open the person's mouth. If you can see the food or object and it can be easily pulled out, remove it with your fingers. 3. After 5 cycles or 2  minutes of CPR, call local emergency services (911 in [place] if you or someone else did not already call. For a conscious adult who is obese or in the later stages of pregnancy: Abdominal thrusts may not be effective when helping people who are in the later stages of pregnancy or who are obese. In these instances, chest thrusts can be used.  1. Ask the person whether he or she is choking. If the person nods and has signs of complete airway blockage, continue to step 2. 2. Stand behind the person and wrap your arms around his or her chest (with your arms under the person's armpits). 3. Make a fist with 1 hand. Place the thumb side of your fist on the middle of the person's breastbone. 4. Grab your fist with your other hand and thrust backward. Continue this until the object comes out or until the person becomes unconscious. For an unconscious adult who is obese or in the later stages of pregnancy:  1. Shout for help. If someone responds, have him or her call local emergency services (911 in [place] If no one responds, call local emergency services yourself if possible. 2. Begin CPR, starting with compressions. Every time you open the airway to give rescue breaths, open the person's mouth. If you can see the food or object and it can be easily pulled out, remove it with your fingers. 3. After 5 cycles or 2 minutes of CPR, call local emergency services (911 in [place] if you or someone else did not already call. Note that abdominal thrusts (below the rib cage) should be used for a pregnant woman if possible. This should be possible until the later stages of pregnancy when there is no longer enough room between the enlarging uterus and the rib cage to perform the maneuver. At that point, chest thrusts must be used as described. IF YOU ARE CHOKING: 1. Call local emergency services (911 in [place] if near a landline. Do not worry about communicating what is happening. Do not hang up the phone. Someone may be sent  to help you anyway. 2. Make a fist with 1 hand. Put the thumb side of the fist against your stomach, just above the belly button and well below the breastbone. If you are pregnant or obese, put your fist on your chest instead, just below the breastbone and just above your lowest ribs. 3. Hold your fist with your other hand and bend over a hard surface, such as a table or chair. 4. Forcefully push your fist in and up. 5. Continue to do this until the food or object comes out. PREVENTION  To be prepared if choking occurs, learn how to correctly perform abdominal thrusts and give CPR by taking a certified first-aid training course.  SEEK IMMEDIATE MEDICAL CARE IF:  You have a fever after choking stops.  You have problems breathing after choking stops.  You received the Heimlich maneuver. MAKE SURE YOU:  Understand these instructions.  Watch your  condition.  Get help right away if you are not doing well or get worse. Document Released: 09/30/2004 Document Revised: 05/17/2012 Document Reviewed: 04/04/2012 [facility] Patient Information 2014 [employer] [place].

## 2013-12-03 NOTE — ED Notes (Signed)
Pt reports to the ED for eval of possible aspiration. Per EMS family noted she has been having increased difficulty swallowing x approx 1 week and when the home health RN arrived for her usual visit and noted the patient to have rales. 12 lead for EMS showed 1st degree HB. HR 80s-90s. Pt reports a productive white cough. She reports chest soreness with coughing. Denies any SOB. Also denies any pain at this time. Pt alert and oriented at baseline, resp e/u, and skin warm and dry.

## 2013-12-03 NOTE — ED Provider Notes (Signed)
CSN: [id number]     Arrival date & time 12/03/13  1227 History   First MD Initiated Contact with Patient 12/03/13 1230     Chief Complaint  Patient presents with  . Shortness of Breath    HPI 80 year old history of dementia, stroke presents with concern for aspiration pneumonia.  Her home healthcare nurse witnessed her to choke today while drinking Sprite. She coughed for about 30 minutes. She coughed up clear liquid. After the choking episode she listen to her lungs and felt that she was rhonchorous. She called EMS to have the patient sent here for evaluation for aspiration pneumonia.  The patient is unable to provide any significant history you to dementia. History is obtained initially from EMS who was given report by her home health care nurse. Later her daughter provided additional history.  Her daughter reports that she normally eats solid food without difficulty. However over the past 3-4 weeks she has noticed that she sometimes coughs when she takes drinks of water or other liquids. She normally coughs a few times and doesn't seem to have any more difficulty. However the episode today was more severe than usual. She has not noticed any changes in her voice. She is not complaining of a sore throat. She has not had any facial droop, weakness, numbness, paresthesias, change in coordination. She is also not complained of any chest pain, shortness of breath, fever, abdominal pain, nausea, vomiting, or any other acute complaints. She has not had a cough other than when she was drinking today.     Past Medical History  Diagnosis Date  . Hypertension   . Dementia   . Hyperlipidemia   . Seizures   . Metatarsal bone fracture     Right - base of 5th  . DVT (deep venous thrombosis) 02/22/2013    02/18/2013: Left lower extremity (femoral and popliteal) subacute Treated with coumadin x 6-7 months    . Stroke   . Trigeminal neuralgia 11/03/2006    Qualifier: Diagnosis of  By: Abundio Miu     . CATARACT 11/03/2006    Qualifier: Diagnosis of  By: Abundio Miu    . HEARING LOSS NOS OR DEAFNESS 11/03/2006    Qualifier: Diagnosis of  By: Abundio Miu    . INCONTINENCE, URGE 11/03/2006    Qualifier: Diagnosis of  By: Abundio Miu     Past Surgical History  Procedure Laterality Date  . Toe surgery      right 5th toe amputation  . Amputation Right 10/26/2013    Procedure: AMPUTATION BELOW KNEE;  Surgeon: Nadara Mustard, MD;  Location: [facility] OR;  Service: Orthopedics;  Laterality: Right;  Right Below Knee Amputation   Family History  Problem Relation Age of Onset  . Hypertension Mother   . Arthritis Father   . Heart disease Daughter     Heart Disease before age 33  . Hyperlipidemia Daughter   . Hypertension Daughter    History  Substance Use Topics  . Smoking status: Never Smoker   . Smokeless tobacco: Never Used  . Alcohol Use: No   OB History   Grav Para Term Preterm Abortions TAB SAB Ect Mult Living                 Review of Systems  Unable to perform ROS: Dementia  Respiratory: Positive for cough and choking.       Allergies  Review of patient's allergies indicates no known allergies.  Home Medications   Current Outpatient  Rx  Name  Route  Sig  Dispense  Refill  . amiodarone (PACERONE) 200 MG tablet   Oral   Take 1 tablet (200 mg total) by mouth 2 (two) times daily.   60 tablet   11   . atorvastatin (LIPITOR) 20 MG tablet   Oral   Take 1 tablet (20 mg total) by mouth daily.   30 tablet   5   . bisacodyl (DULCOLAX) 10 MG suppository   Rectal   Place 10 mg rectally daily as needed for moderate constipation.         . carBAMazepine (TEGRETOL) 100 MG/5ML suspension   Oral   Take 7.5 mLs (150 mg total) by mouth 4 (four) times daily.   900 mL   12   . carvedilol (COREG) 3.125 MG tablet   Oral   Take 1 tablet (3.125 mg total) by mouth 2 (two) times daily with a meal.   60 tablet   11   . memantine (NAMENDA) 10 MG tablet   Oral    Take 1 tablet (10 mg total) by mouth 2 (two) times daily.   180 tablet   1   . traMADol (ULTRAM) 50 MG tablet   Oral   Take 50 mg by mouth every 6 (six) hours as needed for moderate pain.          . Rivaroxaban (XARELTO) 15 MG TABS tablet   Oral   Take 1 tablet (15 mg total) by mouth daily.   30 tablet   11    BP 160/108  Pulse 80  Temp(Src) 98.1 F (36.7 C) (Oral)  Resp 16  SpO2 98% Physical Exam  Nursing note and vitals reviewed. Constitutional: She appears well-developed and well-nourished. No distress.  HENT:  Head: Normocephalic and atraumatic.  Mouth/Throat: Oropharynx is clear and moist.  Eyes: Conjunctivae and EOM are normal. Pupils are equal, round, and reactive to light.  Neck: Normal range of motion. Neck supple. No tracheal deviation present.  Cardiovascular: Normal rate, regular rhythm and intact distal pulses.  Exam reveals no gallop and no friction rub.   No murmur heard. Pulmonary/Chest: Effort normal. No stridor. No respiratory distress. She has no wheezes. She has rales (faint, bilateral bases). She exhibits no tenderness.  Abdominal: Soft. She exhibits no distension. There is no tenderness. There is no rebound and no guarding.  Musculoskeletal: She exhibits no edema.  Right lower extremity below the knee amputation. Her stump is normal to inspection and palpation with no erythema, tenderness,. And drainage, or wounds.  Neurological: She is alert.  Alert, oriented to person, not place, time, or situation (baseline), GCS 15.  CN 2-12 intact.  5/5 strength in bilateral upper and lower extremities, all major muscle groups.  Normal sensation to light touch in bilateral upper and lower extremities.  Normal coordination.  Normal finger to nose.   Skin: Skin is dry. No rash noted. She is not diaphoretic.  Psychiatric: She has a normal mood and affect.    ED Course  Procedures (including critical care time) Labs Review Labs Reviewed  CBC WITH DIFFERENTIAL -  Abnormal; Notable for the following:    RBC 3.61 (*)    Hemoglobin 10.5 (*)    HCT 31.7 (*)    Monocytes Relative 14 (*)    All other components within normal limits  BASIC METABOLIC PANEL - Abnormal; Notable for the following:    GFR calc non Af Amer 63 (*)    GFR calc Af Denyse DagoAmer  72 (*)    All other components within normal limits   Imaging Review Dg Chest 2 View  12/03/2013   CLINICAL DATA:  Possible aspiration  EXAM: CHEST  2 VIEW  COMPARISON:  11/18/2013  FINDINGS: Mild patchy left lower lobe opacity, likely atelectasis/scarring when correlating with the lateral view, although pneumonia/ aspiration is not entirely excluded.  Mild linear scarring in the right middle lung and lingula. No pleural effusion or pneumothorax.  Cardiomegaly.  Degenerative changes of the visualized thoracolumbar spine.  IMPRESSION: Mild patchy left lower lobe opacity, likely atelectasis/scarring. Pneumonia/aspiration is not entirely excluded.   Electronically Signed   By: Charline Bills M.D.   On: 12/03/2013 13:48     EKG Interpretation   Date/Time:  Monday December 03 2013 12:34:41 EDT Ventricular Rate:  82 PR Interval:  222 QRS Duration: 91 QT Interval:  402 QTC Calculation: 469 R Axis:   -8 Text Interpretation:  Age not entered, assumed to be  80 years old for  purpose of ECG interpretation Sinus rhythm Prolonged PR interval Low  voltage, extremity and precordial leads Nonspecific T abnormalities,  lateral leads Minimal ST elevation, lateral leads Baseline wander in  lead(s) V6 No significant change since last tracing Confirmed by Anitra Lauth   MD, Alphonzo Lemmings ([id number]) on 12/03/2013 2:02:33 PM      MDM   80 year old female with dementia and history of stroke remotely presents after having a choking episode today at home. Her home health nurse sent her here to be evaluated for possible aspiration pneumonia. She does not have fever, productive cough, and has not been coughing prior to today. Her family does  report that she still sometimes cough when she drinks liquids but not otherwise. No other acute symptoms. No neurological symptoms.  On exam she is afebrile, sats 99% on room air, normotensive to hypertensive, otherwise normal vitals. She is well appearing. She is disoriented at baseline secondary to dementia. Her daughter confirms that her mental status is baseline. She has a nonfocal neurological exam as documented. Remainder of her exam is unremarkable and reassuring.  her lab workup is pertinent for hemoglobin of 10.5, stable, normal BMP  She passed bedside swallow eval. She was able to take both solids and liquids without difficulty. No choking episodes. She is handling secretions.  I do not feel that she's had an acute stroke and I do not feel imaging is warranted emergently. Her chest x-ray does show mild patchy left lower lobe opacity, however I do not feel that she clinically has aspiration pneumonia. She has no fever and no cough other than when she drinks liquids. She is certainly at risk for developing aspiration pneumonia.  She is stable and appropriate for discharge.  Spoke with family, recommended nectar thick diet. She will followup with family medicine as scheduled on Wednesday. Spoke with family medicine resident on call and discussed possible need for formal swallow eval and further workup for mild dysphagia.     Final diagnoses:  Cough  Choking episode       Toney Sang, MD 12/03/13 1541

## 2013-12-03 NOTE — Telephone Encounter (Signed)
Will be sending pt to ED. She is having trouble swallowing.

## 2013-12-03 NOTE — ED Provider Notes (Signed)
I saw and evaluated the patient, reviewed the resident's note and I agree with the findings and plan.   EKG Interpretation   Date/Time:  Monday December 03 2013 12:34:41 EDT Ventricular Rate:  82 PR Interval:  222 QRS Duration: 91 QT Interval:  402 QTC Calculation: 469 R Axis:   -8 Text Interpretation:  Age not entered, assumed to be  80 years old for  purpose of ECG interpretation Sinus rhythm Prolonged PR interval Low  voltage, extremity and precordial leads Nonspecific T abnormalities,  lateral leads Minimal ST elevation, lateral leads Baseline wander in  lead(s) V6 No significant change since last tracing Confirmed by Anitra Lauth   MD, Alphonzo Lemmings ([id number]) on 12/03/2013 2:02:33 PM      I have reviewed EKG and agree with the resident interpretation.  you Pt hwere with possible aspiration where she had a coughing episode today after drinking a liquid.  NO SOB or dysphagia here.  Passed swallow screen without difficulty.  Pt a baseline and normal or unchanged labs.  Pt has no sx concerning for aspiration PNA.  Spoke with PCP family practice and they will f/u with PCP.  Gwyneth Sprout, MD 12/03/13 2114

## 2013-12-05 ENCOUNTER — Inpatient Hospital Stay: Payer: Medicare Other | Admitting: Family Medicine

## 2013-12-05 ENCOUNTER — Telehealth: Payer: Self-pay | Admitting: Family Medicine

## 2013-12-05 NOTE — Telephone Encounter (Signed)
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 Would like to talk to Dr Mikel Cella about some drug interactions

## 2013-12-05 NOTE — Telephone Encounter (Signed)
Will forward to MD. Onelia Cadmus,CMA  

## 2013-12-06 ENCOUNTER — Telehealth: Payer: Self-pay | Admitting: Family Medicine

## 2013-12-06 NOTE — Telephone Encounter (Signed)
Fax sent to [employer] on 12/05/13. Tabor Denham M. Krithik Mapel, M.D.

## 2013-12-06 NOTE — Telephone Encounter (Signed)
Attempted to call. Received busy signal. Will try again.  Michaeal  M. Emery Binz, M.D.

## 2013-12-06 NOTE — Telephone Encounter (Signed)
Home health nurse called and wanted Dr. Mikel Cella to know that Ann [name] fell out of the chair , but did not hurt herself. She is doing better and they are working on getting her in the [facility] program so that she can make her appointments. Her lungs where good and she is eating better. jw

## 2013-12-07 NOTE — Telephone Encounter (Signed)
Noted.  Dr. Durene Cal and the Geriatric team are working on getting a home visit arranged as well.  Thank you, Amber M. Hairford, M.D.

## 2013-12-10 ENCOUNTER — Other Ambulatory Visit: Payer: Self-pay | Admitting: Family Medicine

## 2013-12-11 ENCOUNTER — Telehealth: Payer: Self-pay | Admitting: Family Medicine

## 2013-12-11 NOTE — Telephone Encounter (Signed)
Thank you! Nothing in particular. She needs face-to-face appointment for home health orders.  I truly appreciate this! Riverlyn Kizziah M. Falyn Rubel, M.D.

## 2013-12-11 NOTE — Telephone Encounter (Signed)
Home Visit scheduled 10 AM on 12/13/13.   Ann [name] [phone] will be present and granddaughter Emmajean Osley [phone] will try to attend.   Dr. Dan Maker specifically you would like for [place] to address (other than recent ED visit for swallowing problem?)

## 2013-12-13 ENCOUNTER — Non-Acute Institutional Stay: Admitting: Family Medicine

## 2013-12-13 ENCOUNTER — Telehealth: Payer: Self-pay | Admitting: Clinical

## 2013-12-13 DIAGNOSIS — F068 Other specified mental disorders due to known physiological condition: Secondary | ICD-10-CM

## 2013-12-13 DIAGNOSIS — S88119A Complete traumatic amputation at level between knee and ankle, unspecified lower leg, initial encounter: Secondary | ICD-10-CM

## 2013-12-13 DIAGNOSIS — Z89519 Acquired absence of unspecified leg below knee: Secondary | ICD-10-CM

## 2013-12-13 DIAGNOSIS — R1310 Dysphagia, unspecified: Secondary | ICD-10-CM

## 2013-12-13 DIAGNOSIS — B370 Candidal stomatitis: Secondary | ICD-10-CM

## 2013-12-13 MED ORDER — ACETAMINOPHEN 650 MG PO TABS
650.0000 mg | ORAL_TABLET | Freq: Three times a day (TID) | ORAL | Status: DC
Start: 2013-12-13 — End: 2014-12-31

## 2013-12-13 MED ORDER — NYSTATIN 100000 UNIT/ML MT SUSP: 5.0000 mL | Freq: Four times a day (QID) | OROMUCOSAL | Status: DC

## 2013-12-13 NOTE — Telephone Encounter (Signed)
Dr. Durene Cal did a home visit with the family today and they are ready to move forward with home hospice, per his note.  We are trying to get in touch with Ann [name] but we do not have a good number for her to let her know.  Thank you for your help! Shagun Wordell M. Melonee Gerstel, M.D.

## 2013-12-13 NOTE — Assessment & Plan Note (Addendum)
Patient with recent choking episode while drinking sprite that led her to the ED. She passed a bedside swallow study and had been recently evaluated by Speech in march 2015 while hospitalized. Was instructed to have dysphagia 1 diet in hospital and now nectar thick by ED. Agree with this change.  Patient does have history of CVA and with recent known onset of a fib, prior strokes may have been related to a fib. Patient currently on Xarelto as a result. CT in March without new hemorrhagic stroke (do not think MRI would change management as even if had new CVA-xarelto would be treatment given a fib). Swallowing difficulties could also be related to end stage dementia.   Does have some thrush but none in pharynx. Had not taken nystatin as instructed previously. Reordered at this time.

## 2013-12-13 NOTE — Progress Notes (Signed)
Patient ID: SUTTER DRILLING, female   DOB: 03/02/1928, 80 y.o.   MRN: [id number] Attending Addendum  I examined the patient and discussed the assessment and plan with Dr. Durene Cal. I have reviewed the note and agree.  A total of 30 min face to face time was spent the patient in her home.     Dessa Phi, MD FAMILY MEDICINE TEACHING SERVICE

## 2013-12-13 NOTE — Progress Notes (Signed)
HOME VISIT- Dr. Armen PickupFunches and Dr. Reather LittlerHunter  [name], MD  Phone: [phone]  Subjective:  Chief complaint-noted  Ann NephewDaisy M [name] is a 80 y.o. year old very pleasant female patient who we visited at her home to discuss the following:  End Stage Dementia Family states patient at baseline is only oriented to person. She is currently on Namenda and tolerating the medication. She will try to get up sometimes which places her at fall risk given her recent BKA.  Patient was seen by palliative care in hospital but decision was made by family to hold off on hospice (needed time for processing). Family now amenable to pursue this (both daughter Curlene LabrumBeaula Wolken, spoke to by phone, and granddaughter [name], present).   ROS- family denies any recent loss of weight  Swallowing concerns Patient seen in ED after choking episode on 3/30. Concern was for aspiration pneumonia. Patietn was afebrile but CXR did show LLL atelectasis vs. Infiltrate. Patient has continued at home to be afebrile. At the time she was drinking sprite. She has had no swallowing issues since being home and focusing on thickened liquids and normal solid intake. Patient does have some oral thrush but has only infrequently been taking nystatin. Does continue to eat.  ROS- no fever/chills. No cough.   S/p Right BKA Patient with BKA during hospitalization in March. She is dependent on family for transfers. She does have some bruises near her waist and upper legs due to troubel with transfering (and also easy bruising on xarelto). Has some pain with transfers and previously was on tramadol (but history of seizures).   Past Medical History- Patient Active Problem List   Diagnosis Date Noted  . A-fib 11/21/2013  . Moderate malnutrition 11/20/2013  . S/P BKA (below knee amputation) unilateral 10/26/2013  . HYPERCHOLESTEROLEMIA 11/03/2006  . DEMENTIA, NOT SPECIFIED 11/03/2006  . ALCOHOL ABUSE, UNSPECIFIED 11/03/2006  . CATARACT  11/03/2006  . HEARING LOSS NOS OR DEAFNESS 11/03/2006  . HYPERTENSION, BENIGN SYSTEMIC 11/03/2006  . History of CVA 11/03/2006  . GASTROESOPHAGEAL REFLUX, NO ESOPHAGITIS 11/03/2006  . CONSTIPATION 11/03/2006  . CONVULSIONS, SEIZURES, NOS 11/03/2006  . INCONTINENCE, URGE 11/03/2006   Medications- reviewed and updated Current Outpatient Prescriptions  Medication Sig Dispense Refill  . amiodarone (PACERONE) 200 MG tablet Take 1 tablet (200 mg total) by mouth 2 (two) times daily.  60 tablet  11  . atorvastatin (LIPITOR) 20 MG tablet Take 1 tablet (20 mg total) by mouth daily.  30 tablet  5  . carBAMazepine (TEGRETOL) 100 MG/5ML suspension Take 7.5 mLs (150 mg total) by mouth 4 (four) times daily.  900 mL  12  . carvedilol (COREG) 3.125 MG tablet Take 1 tablet (3.125 mg total) by mouth 2 (two) times daily with a meal.  60 tablet  11  . memantine (NAMENDA) 10 MG tablet Take 1 tablet (10 mg total) by mouth 2 (two) times daily.  180 tablet  1  . Rivaroxaban (XARELTO) 15 MG TABS tablet Take 1 tablet (15 mg total) by mouth daily.  30 tablet  11  . Acetaminophen 650 MG TABS Take 1 tablet (650 mg total) by mouth 3 (three) times daily. Use crush instructions as with other medications.  90 tablet  5  . bisacodyl (DULCOLAX) 10 MG suppository Place 10 mg rectally daily as needed for moderate constipation.      Marland Kitchen. nystatin (MYCOSTATIN) 100000 UNIT/ML suspension Take 5 mLs (500,000 Units total) by mouth 4 (four) times daily. Swish in mouth for  at least a minute before swallowing. Use x 5-7 days  120 mL  0   No current facility-administered medications for this visit.    Objective: BP 122/86  Pulse 80  Temp(Src) 98.3 F (36.8 C)  Resp 16 Gen: Frail, elderly woman resting in hospital bed HEENT: whitish plaque on tongue, Mucous membranes are moist. CV: regular rate and rhythm, no MRG Lungs: clear to auscultation Abdomen: soft/nontender/nondistended/normal bowel sounds  Ext: s/p R BKA, stump unwrapped  and appears to be healing well with no obvious erythema or warmth, no edema in left leg Skin: 3 small bruises near waist/upper thighs Neuro: oriented to person only (baseline per family   Assessment/Plan:  DEMENTIA, NOT SPECIFIED End stage dementia noted during last hospitalization. I have placed a consult with hospice (palliative care consulted in hospital) as family is now ready to pursue home hospice.  Swallowing difficulty Patient with recent choking episode while drinking sprite that led her to the ED. She passed a bedside swallow study and had been recently evaluated by Speech in march 2015 while hospitalized. Was instructed to have dysphagia 1 diet in hospital and now nectar thick by ED. Agree with this change.  Patient does have history of CVA and with recent known onset of a fib, prior strokes may have been related to a fib. Patient currently on Xarelto as a result. CT in March without new hemorrhagic stroke (do not think MRI would change management as even if had new CVA-xarelto would be treatment given a fib). Swallowing difficulties could also be related to end stage dementia.   Does have some thrush but none in pharynx. Had not taken nystatin as instructed previously. Reordered at this time.   S/P BKA (below knee amputation) unilateral Patient is dependent on transfers. She is having difficulty getting from chair to toilet and a drop down rail  Commode seat was requested by home health RN. She also has some pain in her leg as well as with transfers. Was on tramadol but given seizure history, we stopped that at this time. Instead will start tylenol 650mg  TID. Trial x 1 week and if continues with pain, pain control either through hospice or through PCP (could consider low dose liquid narcotic given trouble with pills).   I have reached out on several occasions to [facility] but received busy number [phone]). . We need to figure out how they want face to face  orders placed. Also, if ok, could provide verbal order for Commode with drop down rails. Also, to let Home health RN Lafonda Mosses know we have consulted hospice (family now amenable after not being ready in hospital).    Meds ordered this encounter  Medications  . Acetaminophen 650 MG TABS    Sig: Take 1 tablet (650 mg total) by mouth 3 (three) times daily. Use crush instructions as with other medications.    Dispense:  90 tablet    Refill:  5  . nystatin (MYCOSTATIN) 100000 UNIT/ML suspension    Sig: Take 5 mLs (500,000 Units total) by mouth 4 (four) times daily. Swish in mouth for at least a minute before swallowing. Use x 5-7 days    Dispense:  120 mL    Refill:  0

## 2013-12-13 NOTE — Assessment & Plan Note (Addendum)
Patient is dependent on transfers. She is having difficulty getting from chair to toilet and a drop down rail  Commode seat was requested by home health RN. She also has some pain in her leg as well as with transfers. Was on tramadol but given seizure history, we stopped that at this time. Instead will start tylenol 650mg  TID. Trial x 1 week and if continues with pain, pain control either through hospice or through PCP (could consider low dose liquid narcotic given trouble with pills).   I have reached out on several occasions to [facility] Nursing but received busy number [phone]). . We need to figure out how they want face to face orders placed. Also, if ok, could provide verbal order for Commode with drop down rails. Also, to let Home health RN Lafonda Mosses know we have consulted hospice (family now amenable after not being ready in hospital).

## 2013-12-13 NOTE — Telephone Encounter (Signed)
I continue to get busy signals at the extension listed. I have attempted multiple times on different days, and so has Dr. Durene Cal.  I think Dr. Durene Cal has already asked, but can we try to get in touch with the case manager by another means? I am happy to talk to her about medications, and the plan for Ann [name].  Thanks, [employer]. Isha Seefeld, M.D.

## 2013-12-13 NOTE — Assessment & Plan Note (Addendum)
End stage dementia noted during last hospitalization. I have placed a consult with hospice (palliative care consulted in hospital) as family is now ready to pursue home hospice.

## 2013-12-13 NOTE — Telephone Encounter (Signed)
Clinical Social Worker (CSW) contacted [facility] regarding the drop down arm rest commode that pt is needing. CSW informed that unfortunately the commode will not be covered under insurance (will cost $156)  as pt has already received a commode-pt will qualify for a new one in 5 years. CSW contacted Roe Coombs the OT with [name] [phone]) to make him aware -  Roe Coombs said he would contact the family. Roe Coombs is hopeful that the pt might be able to receive the commode under hospice.  CSW also tried to contact Lafonda Mosses with [facility] however the phone rang busy every time. CSW has made Dr. Durene Cal aware of the above & will route message to Dr. Mikel Cella, pt's PCP.  Theresia Bough, MSW, LCSW [phone]

## 2013-12-13 NOTE — Telephone Encounter (Signed)
Clinical Education officer, museum (CSW) received a call from [place], [facility] with [facility]. Beverlee Nims expressed concern regarding pt receiving the necessary care at home and concerns about pt eventually being readmitted - Beverlee Nims believes the pt needs Palliative Care or Hospice and is requesting for CSW/PCP to speak with pt daughter. CSW informed Beverlee Nims that this CSW has never met the pt or daughter and did not feel it appropriate for this CSW to have that conversation. CSW inquired whether Beverlee Nims, (who has met the pt/daughter and has concerns) has expressed them to the daughter. Beverlee Nims stated she had not and was hoping that CSW and or PCP would do that. CSW informed Beverlee Nims that the individuals concerned/those following the pt in the home should express their concerns and communicate them to the dtr - Beverlee Nims stated the [facility] social worker should assist with that, CSW agreed.  CSW will forward message to PCP, as requested by Beverlee Nims, and assist if directed by PCP.  Hunt Oris, MSW, [place]

## 2013-12-14 NOTE — Telephone Encounter (Signed)
Ann Bush,  Do you have any other ideas? Ann [name] Ann [name]

## 2013-12-14 NOTE — Telephone Encounter (Signed)
I was able to get Ann [name] on the phone today & Dr.Hairford was able to speak with her. Unfortunately Ann [name] does not have another number but hopes to fix her current number [phone])  Theresia Bough, MSW, LCSW [phone]

## 2014-02-08 ENCOUNTER — Ambulatory Visit (INDEPENDENT_AMBULATORY_CARE_PROVIDER_SITE_OTHER): Admitting: Family Medicine

## 2014-02-08 ENCOUNTER — Encounter: Payer: Self-pay | Admitting: Family Medicine

## 2014-02-08 VITALS — BP 136/92 | HR 68 | Temp 99.4°F

## 2014-02-08 DIAGNOSIS — Z89519 Acquired absence of unspecified leg below knee: Secondary | ICD-10-CM

## 2014-02-08 DIAGNOSIS — I509 Heart failure, unspecified: Secondary | ICD-10-CM

## 2014-02-08 DIAGNOSIS — R609 Edema, unspecified: Secondary | ICD-10-CM

## 2014-02-08 DIAGNOSIS — I5022 Chronic systolic (congestive) heart failure: Secondary | ICD-10-CM | POA: Insufficient documentation

## 2014-02-08 DIAGNOSIS — S88119A Complete traumatic amputation at level between knee and ankle, unspecified lower leg, initial encounter: Secondary | ICD-10-CM

## 2014-02-08 MED ORDER — FUROSEMIDE 20 MG PO TABS: 20.0000 mg | ORAL_TABLET | Freq: Every day | ORAL | Status: DC

## 2014-02-08 NOTE — Progress Notes (Signed)
Patient ID: Ann [name], female   DOB: 07/26/1928, 80 y.o.   MRN: [id number]   [facility] Charlane Ferretti, MD Phone: [phone]  Subjective:  Ann [name] is a 80 y.o F who presents for congestion  Level 5 caveat applies  Hx provided by family members  # Congestion -Does have hx of some thrush in the past. Had not taken nystatin as instructed previously -family noted increased phlegmy spit ups not related to food intake  -no recent fevers or chills -has been more sleepy recently but otherwise no acute changes in health  #Leg swelling -family has noted increased swelling on both legs -mentioned also by hospice nurse -no recent fever or chills -just saw the surgical doctor this morning who reported that wound was healing well   Past Medical History Patient Active Problem List   Diagnosis Date Noted  . Swallowing difficulty 12/13/2013  . A-fib 11/21/2013  . Moderate malnutrition 11/20/2013  . Altered mental status 11/19/2013  . S/P BKA (below knee amputation) unilateral 10/26/2013  . Shoulder mass 10/07/2011  . Keratosis of plantar aspect of foot 12/23/2010  . BACK PAIN 12/13/2008  . BUNIONS, BILATERAL 08/06/2008  . HYPERCHOLESTEROLEMIA 11/03/2006  . DEMENTIA, NOT SPECIFIED 11/03/2006  . ALCOHOL ABUSE, UNSPECIFIED 11/03/2006  . CATARACT 11/03/2006  . HEARING LOSS NOS OR DEAFNESS 11/03/2006  . HYPERTENSION, BENIGN SYSTEMIC 11/03/2006  . CVA 11/03/2006  . GASTROESOPHAGEAL REFLUX, NO ESOPHAGITIS 11/03/2006  . CONSTIPATION 11/03/2006  . CONVULSIONS, SEIZURES, NOS 11/03/2006  . INCONTINENCE, URGE 11/03/2006   Reviewed problem list.  Medications- reviewed and updated Chief complaint-noted No additions to family history Social history- patient is a never smoker  Objective: BP 136/92  Pulse 68  Temp(Src) 99.4 F (37.4 C) (Oral)  SpO2 96% Gen: NAD, alert, cooperative with exam HEENT: NCAT, EOMI Neck: FROM, supple CV: RRR, good S1/S2, iii/vi  blowing systolic murmur, cap refill <3 Resp: normal WOB, somewhat decreased in the bilat bases otherwise CTAB, no wheezes, non-labored Abd: SNTND, BS present, no guarding or organomegaly Ext: 3+ pitting edema noted to knee on left side; right sided BKA warm, normal tone, some red streaking noted  Assessment/Plan: See problem based a/p

## 2014-02-08 NOTE — Patient Instructions (Signed)
Ms Sturgill it was great to meet you and your today!  For the fluid I would recommend a short course of lasix Please take 1X 20mg  pill for the next 7 days Follow up with Dr. Mikel Cella for reassessment as needed  There are no signs of a lung infection at this time Please cont to take your regular medications as directed  Feel better! Charlane Ferretti, MD

## 2014-02-08 NOTE — Assessment & Plan Note (Addendum)
A: chronic; appears somewhat fluid overloaded today with hx of congestion and swelling in legs; PE with decreased breath sounds and 3+ pitting edema in addition to murmur, could also consider allergies (reports sitting outside during the day) vs dysphagia and aspiration contributing to congestion (does not link sx with food ingestion however and no fever or consolidation) No red flags at this time  Was previously on lasix, told to hold 2/2 renal fxn Unfortunately no weights to track closely in system However wonder what GOC are currently given home hospice situation P: will give short course lasix 20mg  X7 days Pt to f/up with Dr. Mikel Cella prn OTC allergy med as needed

## 2014-02-08 NOTE — Assessment & Plan Note (Signed)
A: doing well with home hospice I was initially concerned abt streaking from BKA site Leg healing well per Surgeon who she saw today P: wound care prn F/up with surgeon as needed

## 2014-03-01 ENCOUNTER — Telehealth: Payer: Self-pay | Admitting: Family Medicine

## 2014-03-01 NOTE — Telephone Encounter (Signed)
Received call from MD at [facility]. He re-evaluated [name] today and states that she no longer meets qualifications for Hospice. She is higher functioning than at last evaluation and he feels she is not in her last few weeks to months of her life. He will send paperwork to our clinic to have the home health equipment transferred from a Hospice order to a DME order. He also thinks she would benefit from a home health evaluation, which will be ordered after we get paperwork back from him.  Jordie Schreur M. Ann [name], M.D.

## 2014-05-07 ENCOUNTER — Telehealth: Payer: Self-pay | Admitting: Clinical

## 2014-05-07 NOTE — Telephone Encounter (Signed)
CSW received a call from pt's granddaughter Camilia Broussard. Grandaughter informed CSW that her grandmother was denied services 04/15/14 (I could not confirm whether through her [facility] or PCS). CSW did inform [name] that pt does not have Medicaid therefore would not qualify for PCS. [name] believes the services are through [facility] however her insurance said the denial was based off of what was documented by the provider.  CSW was unable to locate any documentation in the system to confirm that any documentation was received or completed by a provider here this past month. CSW asked [name] to call her insurance and have them call CSW so that CSW and PCP can assist with pt getting services at home  Theresia Bough, MSW, [place] [phone]

## 2014-05-07 NOTE — Telephone Encounter (Signed)
CSW received another call from pt's granddaughter [name]. [name] informed CSW that she called [employer] and was informed that pt's PCP needs to contact them at [phone] to request home services (cooking, cleaning, bathing etc. During the daytime.) Apparently whatever information was given prior (codes) were incorrect? [name] stated that because of pt's dementia, and her below the knee amputation that pt is unable to care for herself safely. CSW informed [name] that CSW would request for PCP to call.  [name] to attempt to call [employer] again to see if they can send request to PCP via fax though they informed her earlier that they would not.   Theresia Bough, MSW, LCSW [phone]

## 2014-05-09 ENCOUNTER — Telehealth: Payer: Self-pay | Admitting: Clinical

## 2014-05-09 NOTE — Telephone Encounter (Signed)
CSW contacted pts granddaughter and informed her that pt's PCP was unable to provide the needed information to pt's insurance as they were unwilling to tell her exactly what they needed/codes required in order to approve home care.  CSW encouraged [name] to contact [employer] and request to speak to a supervisor. CSW and PCP are willing to assist if the documentation can be sent to [facility]. CSW also provided [name] with the number to the the [employer] in [place] to assist in exploring how pt could qualify for Medicaid. [name] is very frustrated with the challenges she is facing with pt's insurance however she is very understanding and appreciative of CSW and PCP's support.  Theresia Bough, MSW, LCSW [phone]

## 2014-05-17 ENCOUNTER — Encounter: Payer: Self-pay | Admitting: Obstetrics and Gynecology

## 2014-05-17 ENCOUNTER — Ambulatory Visit (INDEPENDENT_AMBULATORY_CARE_PROVIDER_SITE_OTHER): Payer: Medicare Other | Admitting: Obstetrics and Gynecology

## 2014-05-17 VITALS — BP 153/97 | HR 85 | Temp 98.5°F

## 2014-05-17 DIAGNOSIS — I4891 Unspecified atrial fibrillation: Secondary | ICD-10-CM

## 2014-05-17 DIAGNOSIS — Z742 Need for assistance at home and no other household member able to render care: Secondary | ICD-10-CM

## 2014-05-17 DIAGNOSIS — M259 Joint disorder, unspecified: Secondary | ICD-10-CM

## 2014-05-17 DIAGNOSIS — R2230 Localized swelling, mass and lump, unspecified upper limb: Secondary | ICD-10-CM

## 2014-05-17 DIAGNOSIS — N949 Unspecified condition associated with female genital organs and menstrual cycle: Secondary | ICD-10-CM | POA: Diagnosis not present

## 2014-05-17 NOTE — Patient Instructions (Signed)
Ann [name] it was great to see you today!  I am pleased to hear that things are going well for you.  I am putting in a referral for home health and they will come and do an assessment.  Also consider pharmacy shopping or talking to your pharmacy about cheaper options for the blood thinner.   Please schedule a follow-up appointment as needed.   Thanks for allowing me to be a part of your care! Dr. Doroteo Glassman

## 2014-05-17 NOTE — Assessment & Plan Note (Signed)
Appears to be a cyst. Family instructed to monitor for signs of growth. Does not cause discomfort to patient at this time.

## 2014-05-17 NOTE — Progress Notes (Signed)
Patient ID: Ann [name], female   DOB: 1928/04/24, 80 y.o.   MRN: [id number]     Subjective:  Chief Complaint  Patient presents with  . Follow-up    HPI: Ann [name] is a 80 y.o. presenting to clinic today to discuss the following:  #Vaginal irritation - Patient says sometimes she gets sore in her vaginal area. Grandaughter who was present at the visit and did most of the talking says that she takes care of her grandmother and as she bathes her she noticed some vaginal bumps present.  They are pinpoint and on the labia. No associated redness, fevers or chills, and no discharge.  #Home health needs - For the last month or so the family has been trying to get home health for their grandmother. She needs help with her ADLs. She is currently wheelchair bound and is dependent on family for needs. She was previously in hospice but was discharged due to improvement in health.   #Medications- Patient's family wanted to know about a cheaper form of anticoagulation instead of Xarelto. They pay $158 a month for the medication.   All systems were reviewed and were negative unless otherwise noted in the HPI Past Medical, Surgical, Social, and Family History Reviewed & Updated per EMR.   Objective: BP 153/97  Pulse 85  Temp(Src) 98.5 F (36.9 C) (Oral)  General: alert, well-developed, NAD, in wheelchair HEENT: NCAT, EOMI, aniteric, MMM, decreased hearing Lungs: CTAB, normal respiratory effort, no accessory muscle use, no crackles, and no wheezes. Heart: RRR, no M/R/G.  Abdomen: soft, NT, ND, no palpable masses Genitalia:  no external lesions Extremities: R. leg BKA. L leg with compression stocking on. L.upper arm with 5cm cyst. Skin: turgor normal and no rashes.    Assessment/Plan: Please see problem based Assessment and Plan   Caryl Ada, DO 05/17/2014, 12:09 PM PGY-1, [facility]

## 2014-05-17 NOTE — Assessment & Plan Note (Signed)
No lesions visualized on exam. Will monitor. Likely just the vestibular glands vs a folliculitis.

## 2014-05-17 NOTE — Assessment & Plan Note (Signed)
Patient instructed to continue using her Xarelto for anticoagulation. Family was told to try and pharmacy shop and call around to see if they can get the medication for cheaper. Maybe even considering a 90 day supply may be cheaper. Discussions on need for anticoagulation can be discussed at a later date.

## 2014-05-20 DIAGNOSIS — Z742 Need for assistance at home and no other household member able to render care: Secondary | ICD-10-CM | POA: Insufficient documentation

## 2014-05-20 NOTE — Addendum Note (Signed)
Addended by: Pincus Large on: 05/20/2014 10:47 AM   Modules accepted: Orders

## 2014-05-20 NOTE — Addendum Note (Signed)
Addended by: Pincus Large on: 05/20/2014 12:04 PM   Modules accepted: Orders

## 2014-06-17 ENCOUNTER — Other Ambulatory Visit: Payer: Self-pay | Admitting: Family Medicine

## 2014-06-18 MED ORDER — ATORVASTATIN CALCIUM 20 MG PO TABS: 20.0000 mg | ORAL_TABLET | Freq: Every day | ORAL | Status: DC

## 2014-07-12 ENCOUNTER — Other Ambulatory Visit: Payer: Self-pay | Admitting: Family Medicine

## 2014-07-15 ENCOUNTER — Telehealth: Payer: Self-pay | Admitting: *Deleted

## 2014-07-15 NOTE — Telephone Encounter (Signed)
Ann [name] from [facility] pharmacy called needing the PCP to give him a call regarding pt's Carbamazepine, Xarelto and amiodarone.  Carbamazpepine is an inducer of metabolism of Xarelto and Amiodarone can slightly increase the levels of the Xarelto.  Please give him a call at [phone].  Clovis Pu, RN

## 2014-07-16 ENCOUNTER — Other Ambulatory Visit: Payer: Self-pay | Admitting: *Deleted

## 2014-07-16 MED ORDER — MEMANTINE HCL 10 MG PO TABS: 10.0000 mg | ORAL_TABLET | Freq: Two times a day (BID) | ORAL | Status: DC

## 2014-07-19 NOTE — Telephone Encounter (Signed)
Received call from Brett Canales at [facility] regarding possible interaction between Xarelto and carbamazepine.  Patient takes carbamazepine for a seizure disorder and med may decrease effects of Xarelto.  Sent message to Dr. Doroteo Glassman via Loretha Stapler to contact Brett Canales at pharmacy.  Altamese Dilling, BSN, RN-BC

## 2014-07-19 NOTE — Telephone Encounter (Signed)
Finally got in contact with Ann [name] after playing phone tag for 3 days. We discussed patient's medications and interactions. I told him I had talked to patient's daughter and she will be scheduling an appointment so that we can review these medications. Patient's family says that she has not been having any bleeding. Carbamazepine for seizure disorder and Xarelto for clots. Patient was previously on warfarin but was changed at her last hospital visit. Decided to keep patient on these medications since she has been on them for more than 4 months now and will readjust at clinic visit after examination.

## 2014-08-15 ENCOUNTER — Encounter (HOSPITAL_COMMUNITY): Payer: Self-pay | Admitting: Vascular Surgery

## 2014-09-25 DIAGNOSIS — I6789 Other cerebrovascular disease: Secondary | ICD-10-CM | POA: Diagnosis not present

## 2014-09-25 DIAGNOSIS — R269 Unspecified abnormalities of gait and mobility: Secondary | ICD-10-CM | POA: Diagnosis not present

## 2014-10-26 DIAGNOSIS — I6789 Other cerebrovascular disease: Secondary | ICD-10-CM | POA: Diagnosis not present

## 2014-10-26 DIAGNOSIS — R269 Unspecified abnormalities of gait and mobility: Secondary | ICD-10-CM | POA: Diagnosis not present

## 2014-11-24 DIAGNOSIS — R269 Unspecified abnormalities of gait and mobility: Secondary | ICD-10-CM | POA: Diagnosis not present

## 2014-11-24 DIAGNOSIS — I6789 Other cerebrovascular disease: Secondary | ICD-10-CM | POA: Diagnosis not present

## 2014-11-28 ENCOUNTER — Other Ambulatory Visit: Payer: Self-pay | Admitting: Family Medicine

## 2014-12-09 ENCOUNTER — Other Ambulatory Visit: Payer: Self-pay | Admitting: Family Medicine

## 2014-12-25 DIAGNOSIS — R269 Unspecified abnormalities of gait and mobility: Secondary | ICD-10-CM | POA: Diagnosis not present

## 2014-12-25 DIAGNOSIS — I6789 Other cerebrovascular disease: Secondary | ICD-10-CM | POA: Diagnosis not present

## 2014-12-29 ENCOUNTER — Emergency Department (HOSPITAL_COMMUNITY): Payer: Medicare Other

## 2014-12-29 ENCOUNTER — Encounter (HOSPITAL_COMMUNITY): Payer: Self-pay | Admitting: *Deleted

## 2014-12-29 ENCOUNTER — Other Ambulatory Visit (HOSPITAL_COMMUNITY): Payer: Self-pay

## 2014-12-29 ENCOUNTER — Inpatient Hospital Stay (HOSPITAL_COMMUNITY)
Admission: EM | Admit: 2014-12-29 | Discharge: 2015-01-01 | DRG: 078 | Disposition: A | Discharge status: Skilled Nursing Facility | Payer: Medicare Other | Attending: Family Medicine | Admitting: Family Medicine

## 2014-12-29 DIAGNOSIS — J069 Acute upper respiratory infection, unspecified: Secondary | ICD-10-CM | POA: Diagnosis present

## 2014-12-29 DIAGNOSIS — R4182 Altered mental status, unspecified: Secondary | ICD-10-CM | POA: Diagnosis present

## 2014-12-29 DIAGNOSIS — Z89511 Acquired absence of right leg below knee: Secondary | ICD-10-CM | POA: Diagnosis not present

## 2014-12-29 DIAGNOSIS — I5042 Chronic combined systolic (congestive) and diastolic (congestive) heart failure: Secondary | ICD-10-CM | POA: Diagnosis present

## 2014-12-29 DIAGNOSIS — Z8673 Personal history of transient ischemic attack (TIA), and cerebral infarction without residual deficits: Secondary | ICD-10-CM

## 2014-12-29 DIAGNOSIS — K219 Gastro-esophageal reflux disease without esophagitis: Secondary | ICD-10-CM | POA: Diagnosis not present

## 2014-12-29 DIAGNOSIS — I161 Hypertensive emergency: Secondary | ICD-10-CM | POA: Diagnosis present

## 2014-12-29 DIAGNOSIS — R1310 Dysphagia, unspecified: Secondary | ICD-10-CM | POA: Diagnosis not present

## 2014-12-29 DIAGNOSIS — E119 Type 2 diabetes mellitus without complications: Secondary | ICD-10-CM | POA: Diagnosis present

## 2014-12-29 DIAGNOSIS — I674 Hypertensive encephalopathy: Principal | ICD-10-CM | POA: Diagnosis present

## 2014-12-29 DIAGNOSIS — I160 Hypertensive urgency: Secondary | ICD-10-CM

## 2014-12-29 DIAGNOSIS — Z7901 Long term (current) use of anticoagulants: Secondary | ICD-10-CM

## 2014-12-29 DIAGNOSIS — R404 Transient alteration of awareness: Secondary | ICD-10-CM | POA: Diagnosis not present

## 2014-12-29 DIAGNOSIS — E780 Pure hypercholesterolemia: Secondary | ICD-10-CM | POA: Diagnosis not present

## 2014-12-29 DIAGNOSIS — E785 Hyperlipidemia, unspecified: Secondary | ICD-10-CM | POA: Diagnosis present

## 2014-12-29 DIAGNOSIS — I10 Essential (primary) hypertension: Secondary | ICD-10-CM | POA: Diagnosis not present

## 2014-12-29 DIAGNOSIS — N183 Chronic kidney disease, stage 3 (moderate): Secondary | ICD-10-CM | POA: Diagnosis not present

## 2014-12-29 DIAGNOSIS — R410 Disorientation, unspecified: Secondary | ICD-10-CM

## 2014-12-29 DIAGNOSIS — R0600 Dyspnea, unspecified: Secondary | ICD-10-CM | POA: Diagnosis not present

## 2014-12-29 DIAGNOSIS — I129 Hypertensive chronic kidney disease with stage 1 through stage 4 chronic kidney disease, or unspecified chronic kidney disease: Secondary | ICD-10-CM | POA: Diagnosis present

## 2014-12-29 DIAGNOSIS — I4891 Unspecified atrial fibrillation: Secondary | ICD-10-CM | POA: Diagnosis not present

## 2014-12-29 DIAGNOSIS — F0390 Unspecified dementia without behavioral disturbance: Secondary | ICD-10-CM | POA: Diagnosis present

## 2014-12-29 DIAGNOSIS — H9190 Unspecified hearing loss, unspecified ear: Secondary | ICD-10-CM | POA: Diagnosis present

## 2014-12-29 DIAGNOSIS — R0602 Shortness of breath: Secondary | ICD-10-CM

## 2014-12-29 DIAGNOSIS — I509 Heart failure, unspecified: Secondary | ICD-10-CM

## 2014-12-29 HISTORY — DX: Gangrene, not elsewhere classified: I96

## 2014-12-29 HISTORY — DX: Unspecified atrial fibrillation: I48.91

## 2014-12-29 HISTORY — DX: Reserved for inherently not codable concepts without codable children: IMO0001

## 2014-12-29 LAB — COMPREHENSIVE METABOLIC PANEL
ALT: 17 U/L (ref 0–35)
ANION GAP: 8 (ref 5–15)
AST: 23 U/L (ref 0–37)
Albumin: 3.2 g/dL — ABNORMAL LOW (ref 3.5–5.2)
Alkaline Phosphatase: 122 U/L — ABNORMAL HIGH (ref 39–117)
BUN: 26 mg/dL — AB (ref 6–23)
CALCIUM: 8.3 mg/dL — AB (ref 8.4–10.5)
CO2: 25 mmol/L (ref 19–32)
CREATININE: 1.17 mg/dL — AB (ref 0.50–1.10)
Chloride: 105 mmol/L (ref 96–112)
GFR, EST AFRICAN AMERICAN: 48 mL/min — AB (ref >90)
GFR, EST NON AFRICAN AMERICAN: 42 mL/min — AB (ref >90)
Glucose, Bld: 81 mg/dL (ref 70–99)
Potassium: 3.8 mmol/L (ref 3.5–5.1)
Sodium: 138 mmol/L (ref 135–145)
TOTAL PROTEIN: 6.9 g/dL (ref 6.0–8.3)
Total Bilirubin: 0.4 mg/dL (ref 0.3–1.2)

## 2014-12-29 LAB — CBC
HEMATOCRIT: 33.9 % — AB (ref 36.0–46.0)
Hemoglobin: 10.9 g/dL — ABNORMAL LOW (ref 12.0–15.0)
MCH: 27.1 pg (ref 26.0–34.0)
MCHC: 32.2 g/dL (ref 30.0–36.0)
MCV: 84.3 fL (ref 78.0–100.0)
PLATELETS: 172 10*3/uL (ref 150–400)
RBC: 4.02 MIL/uL (ref 3.87–5.11)
RDW: 16.7 % — AB (ref 11.5–15.5)
WBC: 4.5 10*3/uL (ref 4.0–10.5)

## 2014-12-29 MED ORDER — NICARDIPINE HCL IN NACL 20-0.86 MG/200ML-% IV SOLN
3.0000 mg/h | Freq: Once | INTRAVENOUS | Status: AC
  Administered 2014-12-29: 5 mg/h via INTRAVENOUS
  Filled 2014-12-29: qty 200

## 2014-12-29 NOTE — ED Notes (Addendum)
Pt's grand-daughter sts her biggest concern is that her grandmother has been more irritable and less aware of her surroundings over the past few days.  Sts they are "worries about her breathing"

## 2014-12-29 NOTE — ED Notes (Signed)
pts grand-daughter states that pt has had a change in her breathing in the past week. States that she tried Mucinex because they thought she had a cold. States that today she noticed a change in her mental status, increased weakness. States that she has dementia.

## 2014-12-29 NOTE — ED Notes (Signed)
MD at bedside. 

## 2014-12-29 NOTE — ED Provider Notes (Signed)
CSN: [id number]     Arrival date & time 12/29/14  2040 History   First MD Initiated Contact with Patient 12/29/14 2255     Chief Complaint  Patient presents with  . Breathing Problem      HPI  Patient presents for evaluation with her family with confusion and possible cough. Daughter who accompanies her provides most of her history. She does have a history of dementia. However she functions at home. Daughter states normally she is calm and oriented. Daughter states she's been confused today she's had trouble understanding her speech. She also states she noticed a different with her breathing and that she is coughing more. She's been more weak and more confused and thus they present her here.  Past Medical History  Diagnosis Date  . Hypertension   . Dementia   . Hyperlipidemia   . Seizures   . Metatarsal bone fracture     Right - base of 5th  . DVT (deep venous thrombosis) 02/22/2013    02/18/2013: Left lower extremity (femoral and popliteal) subacute Treated with coumadin x 6-7 months    . Stroke   . Trigeminal neuralgia 11/03/2006    Qualifier: Diagnosis of  By: Abundio Miu    . CATARACT 11/03/2006    Qualifier: Diagnosis of  By: Abundio Miu    . HEARING LOSS NOS OR DEAFNESS 11/03/2006    Qualifier: Diagnosis of  By: Abundio Miu    . INCONTINENCE, URGE 11/03/2006    Qualifier: Diagnosis of  By: Abundio Miu    . Gangrene    Past Surgical History  Procedure Laterality Date  . Toe surgery      right 5th toe amputation  . Amputation Right 10/26/2013    Procedure: AMPUTATION BELOW KNEE;  Surgeon: Nadara Mustard, MD;  Location: [facility] OR;  Service: Orthopedics;  Laterality: Right;  Right Below Knee Amputation  . Abdominal aortagram N/A 09/13/2013    Procedure: ABDOMINAL Ronny Flurry;  Surgeon: Fransisco Hertz, MD;  Location: [facility] CATH LAB;  Service: Cardiovascular;  Laterality: N/A;   Family History  Problem Relation Age of Onset  . Hypertension Mother   . Arthritis Father    . Heart disease Daughter     Heart Disease before age 85  . Hyperlipidemia Daughter   . Hypertension Daughter    History  Substance Use Topics  . Smoking status: Never Smoker   . Smokeless tobacco: Never Used  . Alcohol Use: No   OB History    No data available     Review of Systems  Unable to perform ROS: Mental status change  Skin: Negative for rash.      Allergies  Review of patient's allergies indicates no known allergies.  Home Medications   Prior to Admission medications   Medication Sig Start Date End Date Taking? Authorizing Provider  Acetaminophen 650 MG TABS Take 1 tablet (650 mg total) by mouth 3 (three) times daily. Use crush instructions as with other medications. 12/13/13   Shelva Majestic, MD  amiodarone (PACERONE) 200 MG tablet TAKE 1 TABLET BY MOUTH TWICE DAILY 11/29/14   Pincus Large, DO  atorvastatin (LIPITOR) 20 MG tablet Take 1 tablet (20 mg total) by mouth daily. 06/18/14   Pincus Large, DO  atorvastatin (LIPITOR) 20 MG tablet TAKE 1 TABLET BY MOUTH DAILY. 06/18/14   Pincus Large, DO  bisacodyl (DULCOLAX) 10 MG suppository Place 10 mg rectally daily as needed for moderate constipation.    Historical Provider,  MD  carBAMazepine (TEGRETOL) 100 MG/5ML suspension Take 7.5 mLs (150 mg total) by mouth 4 (four) times daily. 11/20/13   Abram Sander, MD  carvedilol (COREG) 3.125 MG tablet TAKE 1 TABLET BY MOUTH TWICE DAILY WITH A MEAL 12/09/14   Pincus Large, DO  furosemide (LASIX) 20 MG tablet Take 1 tablet (20 mg total) by mouth daily. 02/08/14   Charlane Ferretti, MD  memantine (NAMENDA) 10 MG tablet Take 1 tablet (10 mg total) by mouth 2 (two) times daily. 07/16/14   Pincus Large, DO  nystatin (MYCOSTATIN) 100000 UNIT/ML suspension Take 5 mLs (500,000 Units total) by mouth 4 (four) times daily. Swish in mouth for at least a minute before swallowing. Use x 5-7 days 12/13/13   Shelva Majestic, MD  XARELTO 15 MG TABS tablet TAKE 1 TABLET BY MOUTH DAILY 11/29/14    Pincus Large, DO   BP 130/92 mmHg  Pulse 72  Temp(Src) 98.4 F (36.9 C) (Oral)  Resp 15  Ht 5' (1.524 m)  Wt 150 lb (68.04 kg)  BMI 29.30 kg/m2  SpO2 96% Physical Exam  Constitutional: She is oriented to person, place, and time. No distress.  Thin, elderly-appearing female. Dysarthric/unintelligible speech.  HENT:  Head: Normocephalic.  Eyes: Conjunctivae are normal. Pupils are equal, round, and reactive to light. No scleral icterus.  Neck: Normal range of motion. Neck supple. No thyromegaly present.  No JVD.  Cardiovascular: Normal rate and regular rhythm.  Exam reveals no gallop and no friction rub.   No murmur heard. Pulmonary/Chest: Effort normal and breath sounds normal. No respiratory distress. She has no wheezes. She has no rales.  Clear lungs. No increased work of breathing.  Abdominal: Soft. Bowel sounds are normal. She exhibits no distension. There is no tenderness. There is no rebound.  Musculoskeletal: Normal range of motion.  Neurological: She is alert and oriented to person, place, and time.  Markedly dysarthric. No pronator drift. Moving lower extremity is with symmetry. No obvious facial droop.  Skin: Skin is warm and dry. No rash noted.  Psychiatric: She has a normal mood and affect. Her behavior is normal.    ED Course  Procedures (including critical care time) Labs Review Labs Reviewed  CBC - Abnormal; Notable for the following:    Hemoglobin 10.9 (*)    HCT 33.9 (*)    RDW 16.7 (*)    All other components within normal limits  COMPREHENSIVE METABOLIC PANEL - Abnormal; Notable for the following:    BUN 26 (*)    Creatinine, Ser 1.17 (*)    Calcium 8.3 (*)    Albumin 3.2 (*)    Alkaline Phosphatase 122 (*)    GFR calc non Af Amer 42 (*)    GFR calc Af Amer 48 (*)    All other components within normal limits  BRAIN NATRIURETIC PEPTIDE - Abnormal; Notable for the following:    B Natriuretic Peptide 614.3 (*)    All other components within normal  limits  PROTIME-INR - Abnormal; Notable for the following:    Prothrombin Time 26.1 (*)    INR 2.37 (*)    All other components within normal limits  URINE CULTURE  TROPONIN I  URINALYSIS, ROUTINE W REFLEX MICROSCOPIC    Imaging Review Dg Chest 2 View  12/29/2014   CLINICAL DATA:  81 year old female with several week history of shortness of breath  EXAM: CHEST  2 VIEW  COMPARISON:  Prior chest x-ray 12/03/2013  FINDINGS:  Stable cardiomegaly. Ectatic and tortuous thoracic aorta. Similar appearance of diffuse thickening of the paratracheal stripes likely reflecting prominent and tortuous great vessels. Slightly increased pulmonary vascular congestion with small volume fluid in the minor fissure. Chronic linear atelectasis versus scarring in the left mid and inferior lung. No pleural effusion or pneumothorax. No acute osseous abnormality.  IMPRESSION: 1. Very similar appearance of the chest compared to prior imaging. There is small volume fluid within the minor fissure on today's examination which is of uncertain clinical significance. No other evidence of pleural effusion. 2. Stable cardiomegaly and mild vascular congestion without evidence of edema. 3. Stable chronic atelectasis versus scarring in the left mid and lower lung.   Electronically Signed   By: Malachy Moan M.D.   On: 12/29/2014 22:24   Ct Head Wo Contrast  12/30/2014   CLINICAL DATA:  Acute onset of confusion. Change in breathing over the past week. Altered mental status and worsening weakness. Initial encounter.  EXAM: CT HEAD WITHOUT CONTRAST  TECHNIQUE: Contiguous axial images were obtained from the base of the skull through the vertex without intravenous contrast.  COMPARISON:  CT of the head performed 11/19/2013, and MRI of the brain performed 08/25/2012  FINDINGS: There is no evidence of acute infarction, mass lesion, or intra- or extra-axial hemorrhage on CT.  Prominence of the ventricles and sulci reflects mild to moderate  cortical volume loss. Mild cerebellar atrophy is noted. Diffuse periventricular and subcortical white matter change likely reflects small vessel ischemic microangiopathy. Chronic ischemic change is noted at the basal ganglia and thalami bilaterally.  The brainstem and fourth ventricle are within normal limits. The cerebral hemispheres demonstrate grossly normal gray-white differentiation. No mass effect or midline shift is seen.  There is no evidence of fracture; small bilateral craniectomy defects are noted at the temporal regions. The visualized portions of the orbits are within normal limits. Dense opacification is noted within the right maxillary sinus and right-sided nasal passages, likely reflecting inspissated mucus, with underlying mucoperiosteal thickening. There is similar opacification in 2013, on the prior MRI. Paranasal sinuses and mastoid air cells are well-aerated. No significant soft tissue abnormalities are seen.  IMPRESSION: 1. No acute intracranial pathology seen on CT. 2. Mild to moderate cortical volume loss and diffuse small vessel ischemic microangiopathy. 3. Chronic ischemic change at the basal ganglia and thalami bilaterally. 4. Dense opacification within the right maxillary sinus and right-sided nasal passages, likely reflecting inspissated mucus, with underlying mucoperiosteal thickening. There was a similar appearance on the prior MRI from 2013.   Electronically Signed   By: Roanna Raider M.D.   On: 12/30/2014 00:44     EKG Interpretation   Date/Time:  Sunday December 29 2014 20:50:45 EDT Ventricular Rate:  73 PR Interval:  274 QRS Duration: 96 QT Interval:  428 QTC Calculation: 471 R Axis:   0 Text Interpretation:  Sinus rhythm with 1st degree A-V block Otherwise  normal ECG When compared with ECG of 12/03/2013, PR interval has lengthened  Confirmed by [facility]  MD, DAVID ([id number]) on 12/29/2014 8:54:25 PM      CRITICAL CARE Performed by: Rolland Porter JOSEPH   Total critical  care time: 60 Start:  00:00 Stop:  01:00  Critical care time was exclusive of separately billable procedures and treating other patients.  Critical care was necessary to treat or prevent imminent or life-threatening deterioration.  Critical care was time spent personally by me on the following activities: development of treatment plan with patient and/or surrogate as  well as nursing, discussions with consultants, evaluation of patient's response to treatment, examination of patient, obtaining history from patient or surrogate, ordering and performing treatments and interventions, ordering and review of laboratory studies, ordering and review of radiographic studies, pulse oximetry and re-evaluation of patient's condition. Care   MDM   Final diagnoses:  Confusion  Hypertensive urgency    She markedly hypertensive. New confusion. Rule out infection, UTI. Rule out CNS hemorrhage. Multiple blood pressure show systolic over 240, diastolic over 120. Cardene drip initiated. CT and labs, urine, pending.    Rolland Porter, MD 12/30/14 [phone]

## 2014-12-29 NOTE — ED Notes (Signed)
Xray notified pt ready for pickup

## 2014-12-30 DIAGNOSIS — K219 Gastro-esophageal reflux disease without esophagitis: Secondary | ICD-10-CM | POA: Diagnosis present

## 2014-12-30 DIAGNOSIS — H9190 Unspecified hearing loss, unspecified ear: Secondary | ICD-10-CM | POA: Diagnosis present

## 2014-12-30 DIAGNOSIS — I129 Hypertensive chronic kidney disease with stage 1 through stage 4 chronic kidney disease, or unspecified chronic kidney disease: Secondary | ICD-10-CM | POA: Diagnosis present

## 2014-12-30 DIAGNOSIS — E785 Hyperlipidemia, unspecified: Secondary | ICD-10-CM | POA: Diagnosis present

## 2014-12-30 DIAGNOSIS — I509 Heart failure, unspecified: Secondary | ICD-10-CM

## 2014-12-30 DIAGNOSIS — I5042 Chronic combined systolic (congestive) and diastolic (congestive) heart failure: Secondary | ICD-10-CM | POA: Diagnosis present

## 2014-12-30 DIAGNOSIS — E780 Pure hypercholesterolemia: Secondary | ICD-10-CM | POA: Diagnosis present

## 2014-12-30 DIAGNOSIS — J069 Acute upper respiratory infection, unspecified: Secondary | ICD-10-CM | POA: Diagnosis present

## 2014-12-30 DIAGNOSIS — Z89511 Acquired absence of right leg below knee: Secondary | ICD-10-CM | POA: Diagnosis not present

## 2014-12-30 DIAGNOSIS — Z8673 Personal history of transient ischemic attack (TIA), and cerebral infarction without residual deficits: Secondary | ICD-10-CM | POA: Diagnosis not present

## 2014-12-30 DIAGNOSIS — R404 Transient alteration of awareness: Secondary | ICD-10-CM | POA: Diagnosis not present

## 2014-12-30 DIAGNOSIS — R4182 Altered mental status, unspecified: Secondary | ICD-10-CM | POA: Diagnosis not present

## 2014-12-30 DIAGNOSIS — R410 Disorientation, unspecified: Secondary | ICD-10-CM | POA: Diagnosis not present

## 2014-12-30 DIAGNOSIS — Z7901 Long term (current) use of anticoagulants: Secondary | ICD-10-CM | POA: Diagnosis not present

## 2014-12-30 DIAGNOSIS — N183 Chronic kidney disease, stage 3 (moderate): Secondary | ICD-10-CM | POA: Diagnosis not present

## 2014-12-30 DIAGNOSIS — E119 Type 2 diabetes mellitus without complications: Secondary | ICD-10-CM | POA: Diagnosis present

## 2014-12-30 DIAGNOSIS — R0600 Dyspnea, unspecified: Secondary | ICD-10-CM | POA: Diagnosis not present

## 2014-12-30 DIAGNOSIS — I10 Essential (primary) hypertension: Secondary | ICD-10-CM

## 2014-12-30 DIAGNOSIS — I674 Hypertensive encephalopathy: Secondary | ICD-10-CM | POA: Diagnosis present

## 2014-12-30 DIAGNOSIS — F0390 Unspecified dementia without behavioral disturbance: Secondary | ICD-10-CM | POA: Diagnosis not present

## 2014-12-30 DIAGNOSIS — I161 Hypertensive emergency: Secondary | ICD-10-CM | POA: Diagnosis present

## 2014-12-30 DIAGNOSIS — R1310 Dysphagia, unspecified: Secondary | ICD-10-CM | POA: Diagnosis present

## 2014-12-30 DIAGNOSIS — I4891 Unspecified atrial fibrillation: Secondary | ICD-10-CM | POA: Diagnosis not present

## 2014-12-30 LAB — BASIC METABOLIC PANEL
Anion gap: 9 (ref 5–15)
BUN: 23 mg/dL (ref 6–23)
CHLORIDE: 106 mmol/L (ref 96–112)
CO2: 24 mmol/L (ref 19–32)
CREATININE: 0.99 mg/dL (ref 0.50–1.10)
Calcium: 8.4 mg/dL (ref 8.4–10.5)
GFR calc Af Amer: 59 mL/min — ABNORMAL LOW (ref >90)
GFR calc non Af Amer: 51 mL/min — ABNORMAL LOW (ref >90)
GLUCOSE: 92 mg/dL (ref 70–99)
POTASSIUM: 3.8 mmol/L (ref 3.5–5.1)
SODIUM: 139 mmol/L (ref 135–145)

## 2014-12-30 LAB — TSH: TSH: 2.809 u[IU]/mL (ref 0.350–4.500)

## 2014-12-30 LAB — CBC
HCT: 31.1 % — ABNORMAL LOW (ref 36.0–46.0)
HEMOGLOBIN: 10.1 g/dL — AB (ref 12.0–15.0)
MCH: 27.5 pg (ref 26.0–34.0)
MCHC: 32.5 g/dL (ref 30.0–36.0)
MCV: 84.7 fL (ref 78.0–100.0)
PLATELETS: 164 10*3/uL (ref 150–400)
RBC: 3.67 MIL/uL — AB (ref 3.87–5.11)
RDW: 16.5 % — ABNORMAL HIGH (ref 11.5–15.5)
WBC: 3.5 10*3/uL — ABNORMAL LOW (ref 4.0–10.5)

## 2014-12-30 LAB — URINALYSIS, ROUTINE W REFLEX MICROSCOPIC
Bilirubin Urine: NEGATIVE
Glucose, UA: NEGATIVE mg/dL
HGB URINE DIPSTICK: NEGATIVE
KETONES UR: NEGATIVE mg/dL
LEUKOCYTES UA: NEGATIVE
Nitrite: NEGATIVE
Protein, ur: 30 mg/dL — AB
Specific Gravity, Urine: 1.026 (ref 1.005–1.030)
UROBILINOGEN UA: 1 mg/dL (ref 0.0–1.0)
pH: 5 (ref 5.0–8.0)

## 2014-12-30 LAB — LIPID PANEL
CHOL/HDL RATIO: 2.6 ratio
CHOLESTEROL: 152 mg/dL (ref 0–200)
HDL: 58 mg/dL (ref >39)
LDL Cholesterol: 87 mg/dL (ref 0–99)
Triglycerides: 34 mg/dL (ref <150)
VLDL: 7 mg/dL (ref 0–40)

## 2014-12-30 LAB — PROTIME-INR
INR: 2.37 — AB (ref 0.00–1.49)
PROTHROMBIN TIME: 26.1 s — AB (ref 11.6–15.2)

## 2014-12-30 LAB — RAPID URINE DRUG SCREEN, HOSP PERFORMED
AMPHETAMINES: NOT DETECTED
BARBITURATES: NOT DETECTED
Benzodiazepines: NOT DETECTED
COCAINE: NOT DETECTED
Opiates: NOT DETECTED
TETRAHYDROCANNABINOL: NOT DETECTED

## 2014-12-30 LAB — HIV ANTIBODY (ROUTINE TESTING W REFLEX): HIV Screen 4th Generation wRfx: NONREACTIVE

## 2014-12-30 LAB — RPR: RPR: NONREACTIVE

## 2014-12-30 LAB — BRAIN NATRIURETIC PEPTIDE: B NATRIURETIC PEPTIDE 5: 614.3 pg/mL — AB (ref 0.0–100.0)

## 2014-12-30 LAB — VITAMIN B12: Vitamin B-12: 165 pg/mL — ABNORMAL LOW (ref 211–911)

## 2014-12-30 LAB — ETHANOL

## 2014-12-30 LAB — TROPONIN I

## 2014-12-30 LAB — CARBAMAZEPINE LEVEL, TOTAL: Carbamazepine Lvl: 10.6 ug/mL (ref 4.0–12.0)

## 2014-12-30 LAB — URINE MICROSCOPIC-ADD ON

## 2014-12-30 MED ORDER — RIVAROXABAN 15 MG PO TABS
15.0000 mg | ORAL_TABLET | Freq: Every day | ORAL | Status: DC
  Administered 2014-12-30 – 2014-12-31 (×2): 15 mg via ORAL
  Filled 2014-12-30 (×2): qty 1

## 2014-12-30 MED ORDER — AMIODARONE HCL 200 MG PO TABS
200.0000 mg | ORAL_TABLET | Freq: Two times a day (BID) | ORAL | Status: DC
  Administered 2014-12-30 – 2015-01-01 (×5): 200 mg via ORAL
  Filled 2014-12-30 (×6): qty 1

## 2014-12-30 MED ORDER — HYDRALAZINE HCL 20 MG/ML IJ SOLN
5.0000 mg | INTRAMUSCULAR | Status: DC | PRN
  Administered 2014-12-30 – 2014-12-31 (×3): 5 mg via INTRAVENOUS
  Filled 2014-12-30 (×3): qty 1

## 2014-12-30 MED ORDER — ATORVASTATIN CALCIUM 20 MG PO TABS
20.0000 mg | ORAL_TABLET | Freq: Every day | ORAL | Status: DC
  Administered 2014-12-30 – 2015-01-01 (×3): 20 mg via ORAL
  Filled 2014-12-30 (×3): qty 1

## 2014-12-30 MED ORDER — MEMANTINE HCL 10 MG PO TABS
10.0000 mg | ORAL_TABLET | Freq: Two times a day (BID) | ORAL | Status: DC
  Administered 2014-12-30 – 2015-01-01 (×5): 10 mg via ORAL
  Filled 2014-12-30 (×6): qty 1

## 2014-12-30 MED ORDER — FUROSEMIDE 20 MG PO TABS
20.0000 mg | ORAL_TABLET | Freq: Every day | ORAL | Status: DC
  Administered 2014-12-30: 20 mg via ORAL
  Filled 2014-12-30 (×2): qty 1

## 2014-12-30 MED ORDER — SODIUM CHLORIDE 0.9 % IJ SOLN
3.0000 mL | Freq: Two times a day (BID) | INTRAMUSCULAR | Status: DC
  Administered 2014-12-30 – 2015-01-01 (×4): 3 mL via INTRAVENOUS

## 2014-12-30 MED ORDER — CARVEDILOL 3.125 MG PO TABS
3.1250 mg | ORAL_TABLET | Freq: Two times a day (BID) | ORAL | Status: DC
  Administered 2014-12-30 – 2015-01-01 (×6): 3.125 mg via ORAL
  Filled 2014-12-30 (×7): qty 1

## 2014-12-30 MED ORDER — LABETALOL HCL 5 MG/ML IV SOLN
10.0000 mg | Freq: Once | INTRAVENOUS | Status: AC
  Administered 2014-12-30: 10 mg via INTRAVENOUS
  Filled 2014-12-30: qty 4

## 2014-12-30 MED ORDER — ATORVASTATIN CALCIUM 20 MG PO TABS
20.0000 mg | ORAL_TABLET | Freq: Every day | ORAL | Status: DC
  Filled 2014-12-30: qty 1

## 2014-12-30 MED ORDER — SODIUM CHLORIDE 0.9 % IV SOLN
INTRAVENOUS | Status: DC
  Administered 2014-12-30 – 2014-12-31 (×2): via INTRAVENOUS

## 2014-12-30 NOTE — Evaluation (Addendum)
Physical Therapy Evaluation Patient Details Name: Ann [name] MRN: [id number] DOB: [birth year] Today's Date: 12/30/2014   History of Present Illness  Ann [name] is a 81 y.o. female adm  with Altered mental status and hypertensive emergency . PMH: dementia, HTN, hyperchlolsterolemia, unilateral BKA, h/o afib (on Xarelto)  Clinical Impression  Pt admitted with above diagnosis. Pt currently with functional limitations due to the deficits listed below (see PT Problem List). Pt will benefit from skilled PT to increase their independence and safety with mobility to allow discharge to the venue listed below.  Recommend SNF at this time unless family able to provide 24hr care (?), no family presetn at time of eval. Will follow for needs     Follow Up Recommendations SNF (unless family able to provide 24hr care)    Equipment Recommendations  None recommended by PT    Recommendations for Other Services       Precautions / Restrictions Precautions Precautions: Fall      Mobility  Bed Mobility Overal bed mobility: Needs Assistance Bed Mobility: Supine to Sit;Sit to Supine     Supine to sit: Min assist Sit to supine: Min assist   General bed mobility comments: requires multi-modal cues for task completion and incr time to complete, min assist for positioning once in supine, bed pad used to assist with scooting, come to EOB  Transfers    deferred with +1 assist and unclear baseline function;                Ambulation/Gait                Stairs            Wheelchair Mobility    Modified Rankin (Stroke Patients Only)       Balance Overall balance assessment: Needs assistance Sitting-balance support: Feet unsupported Sitting balance-Leahy Scale: Fair                                       Pertinent Vitals/Pain Pain Assessment: No/denies pain BP 134/90 HR 70s Mildly dyspneic sitting EOB    Home Living Family/patient expects to be  discharged to:: Unsure (??SNF)                 Additional Comments: no family present, pt is a poor historian    Prior Function Level of Independence: Needs assistance   Gait / Transfers Assistance Needed: ? difficult to assess--from what pt can express to PT--she uses a w/c, transfers with assist and has not been ambulatory since her BKA, does not use her prosthesis     Comments: difficult to assess, pt is poor historian, no family present to determine baseline PLOF     Hand Dominance        Extremity/Trunk Assessment   Upper Extremity Assessment: Defer to OT evaluation           Lower Extremity Assessment: RLE deficits/detail;LLE deficits/detail RLE Deficits / Details: R BKA, difficulty following commands for knee ROM LLE Deficits / Details: knee AROM ~30 to 80* flexion; strength grossly 3/5     Communication   Communication: HOH  Cognition Arousal/Alertness: Awake/alert Behavior During Therapy: WFL for tasks assessed/performed Overall Cognitive Status: History of cognitive impairments - at baseline                      General Comments  Exercises        Assessment/Plan    PT Assessment Patient needs continued PT services  PT Diagnosis Generalized weakness   PT Problem List Decreased strength;Decreased activity tolerance;Decreased mobility  PT Treatment Interventions DME instruction;Functional mobility training;Therapeutic activities;Therapeutic exercise   PT Goals (Current goals can be found in the Care Plan section) Acute Rehab PT Goals Patient Stated Goal: pt unable to state PT Goal Formulation: Patient unable to participate in goal setting Time For Goal Achievement: 01/13/15 Potential to Achieve Goals: Good    Frequency Min 3X/week   Barriers to discharge        Co-evaluation               End of Session   Activity Tolerance: Patient tolerated treatment well Patient left: with call bell/phone within reach;in bed;with  bed alarm set Nurse Communication: Mobility status         Time: 8343-7357 PT Time Calculation (min) (ACUTE ONLY): 26 min   Charges:   PT Evaluation $Initial PT Evaluation Tier I: 1 Procedure PT Treatments $Therapeutic Activity: 8-22 mins   PT G Codes:        Jesaiah Fabiano 01/18/2015, 12:51 PM

## 2014-12-30 NOTE — ED Notes (Signed)
Family practice at the bedside.

## 2014-12-30 NOTE — Progress Notes (Signed)
  Echocardiogram 2D Echocardiogram has been performed.  Ann [name] 12/30/2014, 3:59 PM

## 2014-12-30 NOTE — Progress Notes (Signed)
Utilization review completed. Jynesis Nakamura, RN, BSN. 

## 2014-12-30 NOTE — Progress Notes (Signed)
Patient arrived from ED. Alert to self only. Incontinent of urine. Incont brief removed. No skin breakdown observed. Right BKA. SR on telemetry.

## 2014-12-30 NOTE — H&P (Signed)
Family Medicine Teaching [facility] Admission History and Physical Service Pager: [phone]  Patient name: Ann [name] Medical record number: [id number] Date of birth: Oct 11, 1927 Age: 81 y.o. Gender: female  Primary Care Provider: Baker Janus, DO Consultants: None Code Status: Full  Chief Complaint: Altered Mental Status  Assessment and Plan: Ann [name] is a 81 y.o. female presenting with Altered mental status . PMH is significant for dementia, HTN, hyperchlolsterolemia, unilateral BKA, h/o afib (on Xarelto)  AMS: Likely hypertensive encephalopathy secondary to hypertensive emergency given blood pressures on admission and improvement in mental status after reduction of blood pressures. Blood pressures likely elevated secondary to not taking blood pressure medication. But differential also includes Stroke, PRES.  CT head was negative for acute intracranial process. However, will consider alternative causes including infectious, metabolic, endocrine, cardiac, ingestion:  Infectious - CXR negative for pneumonia and unchanged from CXR on 3/30 - f/u UA, UCx - HIV - RPR  Endocrine - f/u TSH  Metabolic - folate - B12  Cardiac - EKG wnl, given h/o afib concern for arrhythmia - telemetry - echo in the AM  Ingestion - f/u ETOH level - f/u UDS  AMS unlikely to be secondary to stroke  But will order risk stratifying labs: - TSH - A1c - Lipid panel - will consider MRI head of symptoms do not begin to improve with improving blood pressures  - Will get PT/OT - SLP - NPO until after SLP - SW consult for outpt needs  HTN: Hypertensive emergency due to AMS, improving after nicardipine drip - continue home Coreg - Hydralazine 5 mg PRN  AKI- Cr 1.17 from base line .84, likely secondary to hypertension - follow BMPs  Difficulty breathing- Stable, O2 sats high 90s on RA, stable CXR. Coarse breath sounds likely secondary to viral URI - supplemental O2 PRN - continue  to closely monitor  Hypercholesterolemia - continue home Lipitor  Dementia - Continue home tegretol, memantine  CHF: Echo 3/18 EF 40-45%, with dilated ascending aorta to 5.1 cm -  continue home Coreg - Repeat Echo in AM - daily weights - strict I/Os  History of Afib-Stable in NSR - cont home amiodarone - cont home xarelto   FEN/GI: NPO until after SLP, KVO Prophylaxis:  Cont home Xarelto  Disposition: Admit to Family medicine teaching service, Speak with daughter regarding base line  History of Present Illness: Ann [name] is a 81 y.o. female presenting with altered mental status and difficulty breathing with coarse breath sounds. Granddaughter noticed difficulty breathing with coarse breath sounds that has been progressively worsening over the past week. She noticed slurred speech that started yesterday with sometimes unintelligible speech. She has not had any facial droop. Ann [name] mentions that she has been coughing a little more recently and has increased irritation. She has decreased strength with inability to bare weight on left leg for transfers. She has been out of some of her medications but her grandchildren who take care of her are unable to tell which ones these are Denies headache, fevers, sick contacts but has contact with many of her school age great grand children  Review Of Systems: Per HPI otherwise 12 point review of systems was performed and was unremarkable.  Patient Active Problem List   Diagnosis Date Noted  . Need for home health care 05/20/2014  . Genital symptoms, female 05/17/2014  . Chronic systolic CHF (congestive heart failure) 02/08/2014  . Swallowing difficulty 12/13/2013  . A-fib 11/21/2013  . Moderate malnutrition 11/20/2013  .  Altered mental status 11/19/2013  . S/P BKA (below knee amputation) unilateral 10/26/2013  . Shoulder mass 10/07/2011  . Keratosis of plantar aspect of foot 12/23/2010  . BACK PAIN 12/13/2008  . BUNIONS, BILATERAL  08/06/2008  . HYPERCHOLESTEROLEMIA 11/03/2006  . DEMENTIA, NOT SPECIFIED 11/03/2006  . ALCOHOL ABUSE, UNSPECIFIED 11/03/2006  . CATARACT 11/03/2006  . HEARING LOSS NOS OR DEAFNESS 11/03/2006  . HYPERTENSION, BENIGN SYSTEMIC 11/03/2006  . CVA 11/03/2006  . GASTROESOPHAGEAL REFLUX, NO ESOPHAGITIS 11/03/2006  . CONSTIPATION 11/03/2006  . CONVULSIONS, SEIZURES, NOS 11/03/2006  . INCONTINENCE, URGE 11/03/2006   Past Medical History: Past Medical History  Diagnosis Date  . Hypertension   . Dementia   . Hyperlipidemia   . Seizures   . Metatarsal bone fracture     Right - base of 5th  . DVT (deep venous thrombosis) 02/22/2013    02/18/2013: Left lower extremity (femoral and popliteal) subacute Treated with coumadin x 6-7 months    . Stroke   . Trigeminal neuralgia 11/03/2006    Qualifier: Diagnosis of  By: Abundio Miu    . CATARACT 11/03/2006    Qualifier: Diagnosis of  By: Abundio Miu    . HEARING LOSS NOS OR DEAFNESS 11/03/2006    Qualifier: Diagnosis of  By: Abundio Miu    . INCONTINENCE, URGE 11/03/2006    Qualifier: Diagnosis of  By: Abundio Miu    . Gangrene    Past Surgical History: Past Surgical History  Procedure Laterality Date  . Toe surgery      right 5th toe amputation  . Amputation Right 10/26/2013    Procedure: AMPUTATION BELOW KNEE;  Surgeon: Nadara Mustard, MD;  Location: [facility] OR;  Service: Orthopedics;  Laterality: Right;  Right Below Knee Amputation  . Abdominal aortagram N/A 09/13/2013    Procedure: ABDOMINAL Ronny Flurry;  Surgeon: Fransisco Hertz, MD;  Location: [facility] CATH LAB;  Service: Cardiovascular;  Laterality: N/A;   Social History: History  Substance Use Topics  . Smoking status: Never Smoker   . Smokeless tobacco: Never Used  . Alcohol Use: No   Additional social history: Denies tobacco use, occasionally drinks beer. Lives with daughter Please also refer to relevant sections of EMR.  Family History: Family History  Problem Relation Age  of Onset  . Hypertension Mother   . Arthritis Father   . Heart disease Daughter     Heart Disease before age 31  . Hyperlipidemia Daughter   . Hypertension Daughter    Allergies and Medications: No Known Allergies No current facility-administered medications on file prior to encounter.   Current Outpatient Prescriptions on File Prior to Encounter  Medication Sig Dispense Refill  . Acetaminophen 650 MG TABS Take 1 tablet (650 mg total) by mouth 3 (three) times daily. Use crush instructions as with other medications. 90 tablet 5  . amiodarone (PACERONE) 200 MG tablet TAKE 1 TABLET BY MOUTH TWICE DAILY 60 tablet 6  . atorvastatin (LIPITOR) 20 MG tablet Take 1 tablet (20 mg total) by mouth daily. 30 tablet 5  . atorvastatin (LIPITOR) 20 MG tablet TAKE 1 TABLET BY MOUTH DAILY. 30 tablet 5  . bisacodyl (DULCOLAX) 10 MG suppository Place 10 mg rectally daily as needed for moderate constipation.    . carBAMazepine (TEGRETOL) 100 MG/5ML suspension Take 7.5 mLs (150 mg total) by mouth 4 (four) times daily. 900 mL 12  . carvedilol (COREG) 3.125 MG tablet TAKE 1 TABLET BY MOUTH TWICE DAILY WITH A MEAL 60 tablet 5  .  furosemide (LASIX) 20 MG tablet Take 1 tablet (20 mg total) by mouth daily. 7 tablet 0  . memantine (NAMENDA) 10 MG tablet Take 1 tablet (10 mg total) by mouth 2 (two) times daily. 180 tablet 1  . nystatin (MYCOSTATIN) 100000 UNIT/ML suspension Take 5 mLs (500,000 Units total) by mouth 4 (four) times daily. Swish in mouth for at least a minute before swallowing. Use x 5-7 days 120 mL 0  . XARELTO 15 MG TABS tablet TAKE 1 TABLET BY MOUTH DAILY 30 tablet 6    Objective: BP 130/92 mmHg  Pulse 72  Temp(Src) 98.4 F (36.9 C) (Oral)  Resp 15  Ht 5' (1.524 m)  Wt 150 lb (68.04 kg)  BMI 29.30 kg/m2  SpO2 96% Exam: General: NAD, lying comfortably in bed HEENT: MMM, NCAT Cardiovascular:RRR, no murmurs. Mild JVD Respiratory: CTAB with coarse transmitted upper air way sounds Abdomen:  soft, nontender, nondistended, + BS Extremities: Right BKA, left 2+ pitting edema, + DP pulse Skin: macular hyperpigmented rash over bilateral palms, else no lesions noted Neuro:   Exam limited by Pt's ability to hear directions. AO to person, not place or time,. Cranial Nerves II - XII - II - Visual field intact . III, IV, VI - Extraocular movements intact. V - unable to assess. VII - Facial movement intact bilaterally. VIII - difficult to assess X - mild dysarthria XI - Chin turning & shoulder shrug intact bilaterally. XII - Tongue protrusion intact.  Motor Strength - The patient's strength was 5/5 in all extremities and pronator drift was absent. Bulk was normal and fasciculations were absent.  Motor Tone - Muscle tone was assessed at the neck and appendages and was normal.   Coordination - The patient had normal movements in the hands with no ataxia or dysmetria. Tremor was absent.  Gait and Station - deferred due to safety concerns.  Labs and Imaging: CBC BMET   Recent Labs Lab 12/29/14 2105  WBC 4.5  HGB 10.9*  HCT 33.9*  PLT 172    Recent Labs Lab 12/29/14 2105  NA 138  K 3.8  CL 105  CO2 25  BUN 26*  CREATININE 1.17*  GLUCOSE 81  CALCIUM 8.3*     CT head 4/25  IMPRESSION: 1. No acute intracranial pathology seen on CT. 2. Mild to moderate cortical volume loss and diffuse small vessel ischemic microangiopathy. 3. Chronic ischemic change at the basal ganglia and thalami bilaterally. 4. Dense opacification within the right maxillary sinus and right-sided nasal passages, likely reflecting inspissated mucus, with underlying mucoperiosteal thickening. There was a similar appearance on the prior MRI from 2013.  CXR 4/25  IMPRESSION: 1. Very similar appearance of the chest compared to prior imaging. There is small volume fluid within the minor fissure on today's examination which is of uncertain clinical significance. No other evidence of pleural  effusion. 2. Stable cardiomegaly and mild vascular congestion without evidence of edema. 3. Stable chronic atelectasis versus scarring in the left mid and lower lung.    Bonney Aid, MD 12/30/2014, 12:54 AM PGY-1 [facility] FPTS Intern pager: [phone], text pages welcome  I have seen and examined the patient. I have read and agree with the above note. My changes are noted in blue.  Jacquelin Hawking, MD PGY-2, [facility] 12/30/2014, 6:51 AM

## 2014-12-30 NOTE — Progress Notes (Signed)
Family Medicine Teaching Service Daily Progress Note Intern Pager: [phone]  Patient name: Ann [name] Medical record number: [id number] Date of birth: March 29, 1928 Age: 81 y.o. Gender: female  Primary Care Provider: Baker Janus, DO Consultants: None Code Status: Full  Pt Overview and Major Events to Date:  4/25 - Admitted with AMS  Assessment and Plan: [name] is a 81 y.o. female presenting with altered mental status . PMH is significant for dementia, HTN, hyperchlolsterolemia, unilateral BKA, h/o afib (on Xarelto)  AMS: Patient with dementia at baseline. Likely hypertensive encephalopathy (improvement in mental status after reduction of BP. Must also consider stroke, ingestion, or occult infection or metabolic disorders. Head CT negative for acute bleed. Troponin negative TSH wnl. UA unremarkable. Ethanol negative.  - Telemetry - Control BP (see problem below) - Echo pending - Consider MRI if symptoms not improving.  - B12/Folate pending - RPR, HIV pending - UDS pending - A1c and lipid panel pending, though symptoms unlikely to be CVA - SLP consulted  - PT/OT  HTN/Hypertensive Emergency. BP as high as 200s/140s on admission. Possibly due to non-adherence to medications. No evidence for CVA or intracranial process. Renal function wnl. Can consider hyperaldosteronism or pheochromocytoma, however now well controlled on home coreg. S/p nicardepine drip in ED - Continue home coreg - Consider additional agents if poorly controlled - Hydralazine prn  AKI: Cr 1.17 on admission. Likely due to hypertension - Anticipate improvement with better BP control - Continue to monitor  Dyspnea: Stable on room air. CXR clear. Likely secondary to viral URI - Continue to monitor  HFrEF: EF 40-45% on 3/18.  - Continue home coreg - echo pending - daily weights, strict I/Os  History of Afib: NSR on admission - Continue home amiodarone and xarelto  HLD: Continue home lipitor  20mg  Dementia: Continue home namenda  FEN/GI: NPO pending SLP eval, NS @ 38ml/hr PPx: Home Xarelto  Disposition: Admitted to telemetry pending above management.   Subjective:  Doing well this morning. No complaints. Oriented to person, place, and month.   Objective: Temp:  [97.9 F (36.6 C)-98.4 F (36.9 C)] 98.1 F (36.7 C) (04/25 0711) Pulse Rate:  [62-73] 63 (04/25 0711) Resp:  [12-20] 18 (04/25 0711) BP: (126-205)/(89-140) 134/90 mmHg (04/25 0804) SpO2:  [95 %-100 %] 100 % (04/25 0711) Weight:  [150 lb (68.04 kg)-160 lb 7.9 oz (72.8 kg)] 160 lb 7.9 oz (72.8 kg) (04/25 0351) Physical Exam: General: Elderly female in NAD sitting in hospital bed eating breakfast Cardiovascular: RRR, no murmurs appreciated Respiratory: NWOB, Coarse breath sounds bilaterally, upper airway sounds transmitted Abdomen: +BS, soft, NT, ND Extremities: Right BKA, 3+ pitting edema to knee in LLE Neuro: Alert and conversational. Oriented to person, place, and month. No focal neurological deficits. Speech delayed.   Laboratory/Imaging:  Recent Labs Lab 12/29/14 2105 12/30/14 0900  WBC 4.5 3.5*  HGB 10.9* 10.1*  HCT 33.9* 31.1*  PLT 172 164    Recent Labs Lab 12/29/14 2105 12/30/14 0900  NA 138 139  K 3.8 3.8  CL 105 106  CO2 25 24  BUN 26* 23  CREATININE 1.17* 0.99  CALCIUM 8.3* 8.4  PROT 6.9  --   BILITOT 0.4  --   ALKPHOS 122*  --   ALT 17  --   AST 23  --   GLUCOSE 81 92  Troponin <0.03 EtOH < 5 TSH 2.809  UA: Negative  Ardith Dark, MD 12/30/2014, 9:51 AM PGY-1, [facility] FPTS  Intern pager: [phone], text pages welcome

## 2014-12-30 NOTE — Discharge Summary (Signed)
Family Medicine Teaching [facility] Discharge Summary  Patient name: Ann [name] Medical record number: [id number] Date of birth: August 01, 1928 Age: 81 y.o. Gender: female Date of Admission: 12/29/2014  Date of Discharge: 01/01/15 Admitting Physician: Ann Roach McDiarmid, MD  Primary Care Provider: Baker Janus, DO Consultants: None  Indication for Hospitalization: Hypertensive Emergency, AMS  Discharge Diagnoses/Problem List:  Dementia, HTN, HLD, CKD3, Right BKA, history of Afib  Disposition: [facility]  Discharge Condition: Improved, stable  Discharge Exam: (by Dr. Caroleen [name] on day of d/c) Blood pressure 134/93, pulse 69, temperature 98.1 F (36.7 C), temperature source Oral, resp. rate 18, height 5' (1.524 m), weight 163 lb 2.3 oz (74 kg), SpO2 99 %. General: Elderly female in NAD sitting in hospital bed eating breakfast Cardiovascular: RRR, no murmurs appreciated Respiratory: NWOB, Coarse breath sounds bilaterally, upper airway sounds transmitted Abdomen: +BS, soft, NT, ND Extremities: Right BKA, 3+ pitting edema  Neuro: Alert and conversational. Oriented to person and month. No focal neurological deficits. Speech delayed.   Brief Hospital Course:  Ann [name] is an 81 year old female who presented to the ED with confusion and slurred speech for 1 day in the setting of increased work of breathing for 1 week. In the ED, the patient was noted to have a BP in the 200s/140s and was started on a cardene drip. The patient's home coreg was restarted and she was able to be quickly weaned off the cardene drip and she was admitted for further evaluation. Her hospital course, by problem, is outlined below:  AMS: Patient's mental status improved with correction of her BP, and thus her presentation was most likely due to hypertensive encephalopathy. Her threshold for developing acute encephalopathy is low due to her history of dementia. Other work up included negative head CT,  negative troponin, normal TSH, negative UDS, negative ethanol, and negative UA. We obtained RPR and HIV which were negative. B12 was slightly decreased and folate was normal at the time of discharge. The patient's mental status quickly improved and she was back to her baseline on hospital day 1. We consulted PT/OT who recommended SNF placement due to difficulties with gait and transferring. Patient and family agreed to this and the patient was discharged in stable condition to SNF.  HTN/Hypertensive Emergency. We restarted the patient on her home dose of coreg and her BP gradually improved. We also started  amlodipine while here.   HFpEF. We continued the patient's coreg as noted above. We additionally obtained an echo which showed preserved EF (50-55%), mild AI, mild MR, mild to moderate TR, and moderate LVH.   The patient's other chronic conditions were stable during this admission and no changes were made.   Issues for Follow Up:  1. F/u BPs - Patient discharged on Carvedilol 3.125mg  BID and amlodipine  daily. 2. Unclear etiology for patient's hypertensive emergency. Possibly due to medication noncompliance though would not expect such severe BP with missing only low dose of coreg. If continues, can consider further work up for hyperaldosteronism, pheochromocytoma, renal artery stenosis, etc. 3. Patient in sinus rhythm here and additionally rate controlled with coreg. Would consider tapering off amiodarone.  4. Follow up volume status - Discontinued lasix here due to unreliable PO intake - Consider restarting as outpatient as indicated.  Significant Procedures: None  Significant Labs and Imaging:   Recent Labs Lab 12/29/14 2105 12/30/14 0340 12/30/14 0900 12/31/14 0538  WBC 4.5  --  3.5* 3.6*  HGB 10.9*  --  10.1* 9.9*  HCT 33.9* 33.9* 31.1* 30.5*  PLT 172  --  164 152    Recent Labs Lab 12/29/14 2105 12/30/14 0900 12/31/14 0538  NA 138 139 138  K 3.8 3.8 4.1  CL 105  106 105  CO2 GLUCOSE 81 92 87  BUN 26* 23 27*  CREATININE 1.17* 0.99 1.26*  CALCIUM 8.3* 8.4 8.2*  ALKPHOS 122*  --   --   AST 23  --   --   ALT 17  --   --   ALBUMIN 3.2*  --   --    Troponin <0.03 EtOH < 5 TSH 2.809 Carbamazepine level 10.6 A1c 6.6 Lipid panel: Total Cholesterol 152, TG 34, HDL 58, LDL 87 B12 165  RPR - Nonreactive HIV - Nonreactive  UDS negative UA: Negative  Dg Chest 2 View  12/29/2014   CLINICAL DATA:  81 year old female with several week history of shortness of breath  EXAM: CHEST  2 VIEW  COMPARISON:  Prior chest x-ray 12/03/2013  FINDINGS: Stable cardiomegaly. Ectatic and tortuous thoracic aorta. Similar appearance of diffuse thickening of the paratracheal stripes likely reflecting prominent and tortuous great vessels. Slightly increased pulmonary vascular congestion with small volume fluid in the minor fissure. Chronic linear atelectasis versus scarring in the left mid and inferior lung. No pleural effusion or pneumothorax. No acute osseous abnormality.  IMPRESSION: 1. Very similar appearance of the chest compared to prior imaging. There is small volume fluid within the minor fissure on today's examination which is of uncertain clinical significance. No other evidence of pleural effusion. 2. Stable cardiomegaly and mild vascular congestion without evidence of edema. 3. Stable chronic atelectasis versus scarring in the left mid and lower lung.   Electronically Signed   By: Ann [name] M.D.   On: 12/29/2014 22:24   Ct Head Wo Contrast  12/30/2014   CLINICAL DATA:  Acute onset of confusion. Change in breathing over the past week. Altered mental status and worsening weakness. Initial encounter.  EXAM: CT HEAD WITHOUT CONTRAST  TECHNIQUE: Contiguous axial images were obtained from the base of the skull through the vertex without intravenous contrast.  COMPARISON:  CT of the head performed 11/19/2013, and MRI of the brain performed 08/25/2012   FINDINGS: There is no evidence of acute infarction, mass lesion, or intra- or extra-axial hemorrhage on CT.  Prominence of the ventricles and sulci reflects mild to moderate cortical volume loss. Mild cerebellar atrophy is noted. Diffuse periventricular and subcortical white matter change likely reflects small vessel ischemic microangiopathy. Chronic ischemic change is noted at the basal ganglia and thalami bilaterally.  The brainstem and fourth ventricle are within normal limits. The cerebral hemispheres demonstrate grossly normal gray-white differentiation. No mass effect or midline shift is seen.  There is no evidence of fracture; small bilateral craniectomy defects are noted at the temporal regions. The visualized portions of the orbits are within normal limits. Dense opacification is noted within the right maxillary sinus and right-sided nasal passages, likely reflecting inspissated mucus, with underlying mucoperiosteal thickening. There is similar opacification in 2013, on the prior MRI. Paranasal sinuses and mastoid air cells are well-aerated. No significant soft tissue abnormalities are seen.  IMPRESSION: 1. No acute intracranial pathology seen on CT. 2. Mild to moderate cortical volume loss and diffuse small vessel ischemic microangiopathy. 3. Chronic ischemic change at the basal ganglia and thalami bilaterally. 4. Dense opacification within the right maxillary sinus and right-sided nasal passages, likely reflecting inspissated mucus,  with underlying mucoperiosteal thickening. There was a similar appearance on the prior MRI from 2013.   Electronically Signed   By: Roanna Raider M.D.   On: 12/30/2014 00:44   Echo 12/30/2014 Study Conclusions  - Left ventricle: The cavity size was normal. Wall thickness was increased in a pattern of moderate LVH. Systolic function was normal. The estimated ejection fraction was in the range of 50% to 55%. Wall motion was normal; there were no regional  wall motion abnormalities. Doppler parameters are consistent with restrictive physiology, indicative of decreased left ventricular diastolic compliance and/or increased left atrial pressure. Doppler parameters are consistent with high ventricular filling pressure. - Aortic valve: Valve mobility was mildly restricted. There was trivial regurgitation. - Left atrium: The atrium was moderately dilated. - Right ventricle: Systolic function was mildly reduced. - Right atrium: The atrium was moderately dilated. - Tricuspid valve: There was mild-moderate regurgitation. - Pulmonary arteries: Systolic pressure was mildly increased. PA peak pressure: 43 mm Hg (S).   Results/Tests Pending at Time of Discharge: None  Discharge Medications:    Medication List    STOP taking these medications        Acetaminophen 650 MG Tabs     atorvastatin 20 MG tablet  Commonly known as:  LIPITOR     bisacodyl 10 MG suppository  Commonly known as:  DULCOLAX     carBAMazepine 100 MG/5ML suspension  Commonly known as:  TEGRETOL     carbamazepine 300 MG 12 hr capsule  Commonly known as:  CARBATROL     furosemide 20 MG tablet  Commonly known as:  LASIX     nystatin 100000 UNIT/ML suspension  Commonly known as:  MYCOSTATIN      TAKE these medications        amiodarone 200 MG tablet  Commonly known as:  PACERONE  TAKE 1 TABLET BY MOUTH TWICE DAILY     amLODipine 10 MG tablet  Commonly known as:  NORVASC  Take 1 tablet (10 mg total) by mouth daily.     carvedilol 3.125 MG tablet  Commonly known as:  COREG  TAKE 1 TABLET BY MOUTH TWICE DAILY WITH A MEAL     memantine 10 MG tablet  Commonly known as:  NAMENDA  Take 1 tablet (10 mg total) by mouth 2 (two) times daily.     polyethylene glycol packet  Commonly known as:  MIRALAX / GLYCOLAX  Take 17 g by mouth daily as needed for moderate constipation.     XARELTO 15 MG Tabs tablet  Generic drug:  Rivaroxaban  TAKE 1 TABLET  BY MOUTH DAILY        Discharge Instructions: Please refer to Patient Instructions section of EMR for full details.  Patient was counseled important signs and symptoms that should prompt return to medical care, changes in medications, dietary instructions, activity restrictions, and follow up appointments.   Follow-Up Appointments: Patient discharged to SNF.   Prepared by: Jacquiline Doe, MD 01/01/2015, 3:37 PM PGY-1, [facility] Family Medicine  Finalized by: Latrelle Dodrill, MD

## 2014-12-30 NOTE — Evaluation (Signed)
Clinical/Bedside Swallow Evaluation Patient Details  Name: Ann [name] MRN: [id number] Date of Birth: [birth year]  Today's Date: 12/30/2014 Time: SLP Start Time (ACUTE ONLY): 0820 SLP Stop Time (ACUTE ONLY): 0843 SLP Time Calculation (min) (ACUTE ONLY): 23 min  Past Medical History:  Past Medical History  Diagnosis Date  . Hypertension   . Dementia   . Hyperlipidemia   . Seizures   . Metatarsal bone fracture     Right - base of 5th  . DVT (deep venous thrombosis) 02/22/2013    02/18/2013: Left lower extremity (femoral and popliteal) subacute Treated with coumadin x 6-7 months    . Stroke   . Trigeminal neuralgia 11/03/2006    Qualifier: Diagnosis of  By: Abundio Miu    . CATARACT 11/03/2006    Qualifier: Diagnosis of  By: Abundio Miu    . HEARING LOSS NOS OR DEAFNESS 11/03/2006    Qualifier: Diagnosis of  By: Abundio Miu    . INCONTINENCE, URGE 11/03/2006    Qualifier: Diagnosis of  By: Abundio Miu    . Gangrene    Past Surgical History:  Past Surgical History  Procedure Laterality Date  . Toe surgery      right 5th toe amputation  . Amputation Right 10/26/2013    Procedure: AMPUTATION BELOW KNEE;  Surgeon: Nadara Mustard, MD;  Location: [facility] OR;  Service: Orthopedics;  Laterality: Right;  Right Below Knee Amputation  . Abdominal aortagram N/A 09/13/2013    Procedure: ABDOMINAL Ronny Flurry;  Surgeon: Fransisco Hertz, MD;  Location: [facility] CATH LAB;  Service: Cardiovascular;  Laterality: N/A;   HPI:  Ann [name] is a 81 y.o. female presenting with Altered mental status . PMH is significant for dementia, HTN, hyperchlolsterolemia, unilateral BKA, h/o afib. MD suspects hypertensive encephalopathy. CXR clear. Pt failed RN stroke swallow.    Assessment / Plan / Recommendation Clinical Impression  Pt presents with appearance of normal swallow function though given lack of dentition, pt would better tolerate softened solids. No signs of aspiration observed during assessment  though pt does have some expectoration of mucous prior to POs being given and then followed by belching, suggestive of a possible esophageal issue. Recommend a Dys 3 (mechanical soft diet) with thin liquids with basic precautions. No SLP f/u needed.     Aspiration Risk  Mild    Diet Recommendation Dysphagia 3 (Mechanical Soft);Thin liquid   Liquid Administration via: Cup;Straw Medication Administration: Whole meds with liquid Supervision: Patient able to self feed Postural Changes and/or Swallow Maneuvers: Seated upright 90 degrees;Upright 30-60 min after meal    Other  Recommendations Oral Care Recommendations: Oral care BID   Follow Up Recommendations  None    Frequency and Duration        Pertinent Vitals/Pain NA    SLP Swallow Goals     Swallow Study Prior Functional Status       General HPI: Ann [name] is a 81 y.o. female presenting with Altered mental status . PMH is significant for dementia, HTN, hyperchlolsterolemia, unilateral BKA, h/o afib. MD suspects hypertensive encephalopathy. CXR clear. Pt failed RN stroke swallow.  Type of Study: Bedside swallow evaluation Previous Swallow Assessment: none Diet Prior to this Study: NPO Temperature Spikes Noted: No Respiratory Status: Room air History of Recent Intubation: No Behavior/Cognition: Alert;Cooperative;Pleasant mood;Hard of hearing Oral Cavity - Dentition: Edentulous Self-Feeding Abilities: Able to feed self Patient Positioning: Upright in bed Baseline Vocal Quality: Clear Volitional Cough: Strong Volitional Swallow: Able to elicit  Oral/Motor/Sensory Function Overall Oral Motor/Sensory Function: Appears within functional limits for tasks assessed   Ice Chips     Thin Liquid Thin Liquid: Within functional limits Presentation: Cup;Straw;Self Fed    Nectar Thick Nectar Thick Liquid: Not tested   Honey Thick Honey Thick Liquid: Not tested   Puree Puree: Within functional limits   Solid   GO     Solid: Within functional limits      [facility] [place], [place] CCC-SLP [phone]  Claudine Mouton 12/30/2014,8:48 AM

## 2014-12-30 NOTE — ED Notes (Signed)
Pt more receptive to RN requests to follow commands.  Pt able to lift her leg and turn herself in bed as requested.

## 2014-12-31 ENCOUNTER — Encounter (HOSPITAL_COMMUNITY): Payer: Self-pay | Admitting: General Practice

## 2014-12-31 LAB — HEMOGLOBIN A1C
Hgb A1c MFr Bld: 6.6 % — ABNORMAL HIGH (ref 4.8–5.6)
Mean Plasma Glucose: 143 mg/dL

## 2014-12-31 LAB — CBC
HCT: 30.5 % — ABNORMAL LOW (ref 36.0–46.0)
Hemoglobin: 9.9 g/dL — ABNORMAL LOW (ref 12.0–15.0)
MCH: 27 pg (ref 26.0–34.0)
MCHC: 32.5 g/dL (ref 30.0–36.0)
MCV: 83.3 fL (ref 78.0–100.0)
Platelets: 152 10*3/uL (ref 150–400)
RBC: 3.66 MIL/uL — ABNORMAL LOW (ref 3.87–5.11)
RDW: 16.7 % — ABNORMAL HIGH (ref 11.5–15.5)
WBC: 3.6 10*3/uL — ABNORMAL LOW (ref 4.0–10.5)

## 2014-12-31 LAB — FOLATE RBC
Folate, Hemolysate: 231 ng/mL
Folate, RBC: 681 ng/mL (ref >498)
HEMATOCRIT: 33.9 % — AB (ref 34.0–46.6)

## 2014-12-31 LAB — URINE CULTURE
COLONY COUNT: NO GROWTH
Culture: NO GROWTH

## 2014-12-31 LAB — BASIC METABOLIC PANEL
Anion gap: 8 (ref 5–15)
BUN: 27 mg/dL — ABNORMAL HIGH (ref 6–23)
CO2: 25 mmol/L (ref 19–32)
CREATININE: 1.26 mg/dL — AB (ref 0.50–1.10)
Calcium: 8.2 mg/dL — ABNORMAL LOW (ref 8.4–10.5)
Chloride: 105 mmol/L (ref 96–112)
GFR calc non Af Amer: 38 mL/min — ABNORMAL LOW (ref >90)
GFR, EST AFRICAN AMERICAN: 44 mL/min — AB (ref >90)
Glucose, Bld: 87 mg/dL (ref 70–99)
Potassium: 4.1 mmol/L (ref 3.5–5.1)
Sodium: 138 mmol/L (ref 135–145)

## 2014-12-31 MED ORDER — RIVAROXABAN 15 MG PO TABS
15.0000 mg | ORAL_TABLET | Freq: Every day | ORAL | Status: DC
  Administered 2015-01-01: 15 mg via ORAL
  Filled 2014-12-31: qty 1

## 2014-12-31 MED ORDER — CARBAMAZEPINE 100 MG/5ML PO SUSP
150.0000 mg | Freq: Four times a day (QID) | ORAL | Status: DC
  Administered 2014-12-31: 150 mg via ORAL
  Filled 2014-12-31 (×3): qty 10

## 2014-12-31 MED ORDER — AMLODIPINE BESYLATE 10 MG PO TABS
10.0000 mg | ORAL_TABLET | Freq: Every day | ORAL | Status: DC
  Administered 2014-12-31 – 2015-01-01 (×2): 10 mg via ORAL
  Filled 2014-12-31 (×2): qty 1

## 2014-12-31 MED ORDER — AMLODIPINE BESYLATE 10 MG PO TABS: 10.0000 mg | ORAL_TABLET | Freq: Every day | ORAL | Status: DC

## 2014-12-31 MED ORDER — CARBAMAZEPINE ER 200 MG PO TB12
300.0000 mg | ORAL_TABLET | Freq: Two times a day (BID) | ORAL | Status: DC
  Administered 2014-12-31 – 2015-01-01 (×2): 300 mg via ORAL
  Filled 2014-12-31 (×4): qty 1

## 2014-12-31 MED ORDER — CYANOCOBALAMIN 1000 MCG/ML IJ SOLN
1000.0000 ug | Freq: Once | INTRAMUSCULAR | Status: AC
  Administered 2014-12-31: 1000 ug via INTRAMUSCULAR
  Filled 2014-12-31: qty 1

## 2014-12-31 MED ORDER — ACETAMINOPHEN 325 MG PO TABS
650.0000 mg | ORAL_TABLET | Freq: Four times a day (QID) | ORAL | Status: DC | PRN
  Administered 2014-12-31: 650 mg via ORAL
  Filled 2014-12-31: qty 2

## 2014-12-31 NOTE — Evaluation (Signed)
Occupational Therapy Evaluation Patient Details Name: Ann [name] MRN: [id number] DOB: 1928/06/14 Today's Date: 12/31/2014    History of Present Illness Ann [name] is a 81 y.o. female adm  with Altered mental status and hypertensive emergency . PMH: dementia, HTN, hyperchlolsterolemia, unilateral BKA, h/o afib (on Xarelto)   Clinical Impression   Pt reports having assist at home for ADL, but able to feed herself.  Pt is a poor historian and no family was available, but performance today is consistent with this report. No acute OT needs.  Recommend SNF if family is no longer able to provide the level of assistance pt requires.      Follow Up Recommendations  SNF;Supervision/Assistance - 24 hour    Equipment Recommendations       Recommendations for Other Services       Precautions / Restrictions Precautions Precautions: Fall Restrictions Weight Bearing Restrictions: No      Mobility Bed Mobility Overal bed mobility: Needs Assistance Bed Mobility: Rolling Rolling: Min assist         General bed mobility comments: requires multi-modal cues for task completion and increase time to complete  Transfers                 General transfer comment: did not transfer in the absence of a second person    Balance     Sitting balance-Leahy Scale: Fair                                      ADL Overall ADL's : Needs assistance/impaired Eating/Feeding: Set up;Bed level (drank, NT reports that she self feeds)               Upper Body Dressing : Maximal assistance;Bed level (changed wet gown)                     General ADL Comments: Pt incontinent of bladder, but aware she was wet. Changed bed pads and gown.     Vision     Perception     Praxis      Pertinent Vitals/Pain Pain Assessment: No/denies pain     Hand Dominance Right   Extremity/Trunk Assessment Upper Extremity Assessment Upper Extremity Assessment: Generalized  weakness (full ROM)   Lower Extremity Assessment Lower Extremity Assessment: Defer to PT evaluation RLE Deficits / Details: R BKA       Communication Communication Communication: HOH   Cognition Arousal/Alertness: Awake/alert Behavior During Therapy: WFL for tasks assessed/performed Overall Cognitive Status: No family/caregiver present to determine baseline cognitive functioning (hx of dementia)                     General Comments       Exercises       Shoulder Instructions      Home Living Family/patient expects to be discharged to:: Unsure                                 Additional Comments: no family present, pt is a poor historian      Prior Functioning/Environment Level of Independence: Needs assistance  Gait / Transfers Assistance Needed: ? difficult to assess,she uses a w/c, transfers with assist and has not been ambulatory since her BKA, does not use her prosthesis ADL's / Homemaking Assistance Needed: pt reports her family  helps with changing her incontinence brief, bathing and dressing, pt feeds herself Communication / Swallowing Assistance Needed: very HOH Comments: difficult to assess, pt is poor historian, no family present to determine baseline PLOF    OT Diagnosis: Generalized weakness;Cognitive deficits   OT Problem List:     OT Treatment/Interventions:      OT Goals(Current goals can be found in the care plan section) Acute Rehab OT Goals Patient Stated Goal: pt unable to state  OT Frequency:     Barriers to D/C:            Co-evaluation              End of Session    Activity Tolerance: Patient tolerated treatment well Patient left: in bed;with call bell/phone within reach;with bed alarm set   Time: 1120-1144 OT Time Calculation (min): 24 min Charges:  OT General Charges $OT Visit: 1 Procedure OT Evaluation $Initial OT Evaluation Tier I: 1 Procedure OT Treatments $Self Care/Home Management : 8-22  mins G-Codes:    Ann [name] 12/31/2014, 11:51 AM  [phone]

## 2014-12-31 NOTE — Progress Notes (Signed)
Family Medicine Teaching Service Daily Progress Note Intern Pager: [phone]  Patient name: Ann [name] Medical record number: [id number] Date of birth: July 24, 1928 Age: 81 y.o. Gender: female  Primary Care Provider: Baker Janus, DO Consultants: None Code Status: Full  Pt Overview and Major Events to Date:  4/25 - Admitted with AMS  Assessment and Plan: Ann [name] is a 81 y.o. female presenting with altered mental status . PMH is significant for dementia, HTN, hyperchlolsterolemia, unilateral BKA, h/o afib (on Xarelto)  AMS: Patient with dementia at baseline. Likely hypertensive encephalopathy (improvement in mental status after reduction of BP. Must also consider stroke, ingestion, or occult infection or metabolic disorders. Head CT negative for acute bleed. Troponin negative TSH wnl. UA unremarkable. Ethanol negative. UDS negative. RPR/HIV negative. Carbamazepine level within normal limits. B12 slightly decreased.  - Control BP (see problem below) - Folate pending - SLP consulted - Dysphagia 3 diet recommended - PT/OT - Recommend SNF  HTN/Hypertensive Emergency. BP as high as 200s/140s on admission. Possibly due to non-adherence to medications. No evidence for CVA or intracranial process. Renal function wnl. Can consider hyperaldosteronism or pheochromocytoma, however now well controlled on home coreg. S/p nicardepine drip in ED - Continue home coreg, add amlodipine 10mg  daily - Consider additional agents if poorly controlled - Hydralazine prn  CKD stage 3: Cr 1.17 on admission. Baseline 1-1.4. Likely related to poorly controlled HTN. - Continue to monitor  Cough: Stable on room air. CXR clear. Likely secondary to viral URI - Continue to monitor  HFpEF: Echo here with EF 50-55%, mild AI, MR, mild to moderate TR, mildly elevated pulmonary pressure, moderate LVH. - Continue home coreg - daily weights, strict I/Os - Add ARB as BP tolerates  History of Afib: NSR on  admission - Continue home amiodarone and xarelto  T2DM: A1c 6.6 here. No prior diagnosis - Consider starting metformin as outpatient.   HLD: Continue home lipitor 20mg  Dementia: Continue home namenda, tegretol   FEN/GI: NPO pending SLP eval, NS @ 47ml/hr PPx: Home Xarelto  Disposition: Medically stable for discharge to SNF when bed available.   Subjective:  Complaining about blood draws this morning. Thinks she is in [facility]. Oriented to person and month.   Objective: Temp:  [97.7 F (36.5 C)-100.2 F (37.9 C)] 98.3 F (36.8 C) (04/26 0604) Pulse Rate:  [59-75] 59 (04/26 0604) Resp:  [17-18] 18 (04/26 0604) BP: (129-164)/(70-110) 150/90 mmHg (04/26 0659) SpO2:  [96 %-100 %] 100 % (04/26 0604) Weight:  [164 lb 0.4 oz (74.4 kg)] 164 lb 0.4 oz (74.4 kg) (04/26 0604) Physical Exam: General: Elderly female in NAD sitting in hospital bed eating breakfast Cardiovascular: RRR, no murmurs appreciated Respiratory: NWOB, Coarse breath sounds bilaterally, upper airway sounds transmitted Abdomen: +BS, soft, NT, ND Extremities: Right BKA, 3+ pitting edema to knee in LLE Neuro: Alert and conversational. Oriented to person and month. No focal neurological deficits. Speech delayed.   Laboratory/Imaging:  Recent Labs Lab 12/29/14 2105 12/30/14 0900 12/31/14 0538  WBC 4.5 3.5* 3.6*  HGB 10.9* 10.1* 9.9*  HCT 33.9* 31.1* 30.5*  PLT 172 164 152    Recent Labs Lab 12/29/14 2105 12/30/14 0900  NA 138 139  K 3.8 3.8  CL 105 106  CO2 25 24  BUN 26* 23  CREATININE 1.17* 0.99  CALCIUM 8.3* 8.4  PROT 6.9  --   BILITOT 0.4  --   ALKPHOS 122*  --   ALT 17  --   AST  23  --   GLUCOSE 81 92   Carbamazepine level 10.6 A1c 6.6 Lipid panel: Total Cholesterol 152, TG 34, HDL 58, LDL 87 B12 165  RPR - Nonreactive HIV - Nonreactive  UDS negative  Ann Dark, MD 12/31/2014, 7:16 AM PGY-1, [facility] Family Medicine FPTS Intern pager: [phone], text pages  welcome

## 2014-12-31 NOTE — Discharge Instructions (Signed)
You were admitted to the hospital with very high blood pressures. While here, we restarted your medications and your blood pressure returned to normal levels. We also restarted amlodipine to help control your blood pressure. You met with our physical therapists who recommended that you go to a rehab facility to regain your strength. It is important that you follow up with your regular doctor after leaving the hospital.

## 2014-12-31 NOTE — Progress Notes (Signed)
Pt BP 164/108 this AM, PRN hydralazine given, follow up BP 150/90. Day shift RN notified

## 2014-12-31 NOTE — Progress Notes (Signed)
Bladder scan performed, resulting with 0 residual.

## 2015-01-01 DIAGNOSIS — I6789 Other cerebrovascular disease: Secondary | ICD-10-CM | POA: Diagnosis not present

## 2015-01-01 DIAGNOSIS — I480 Paroxysmal atrial fibrillation: Secondary | ICD-10-CM | POA: Diagnosis not present

## 2015-01-01 DIAGNOSIS — I4891 Unspecified atrial fibrillation: Secondary | ICD-10-CM | POA: Diagnosis not present

## 2015-01-01 DIAGNOSIS — R062 Wheezing: Secondary | ICD-10-CM | POA: Diagnosis not present

## 2015-01-01 DIAGNOSIS — Z8673 Personal history of transient ischemic attack (TIA), and cerebral infarction without residual deficits: Secondary | ICD-10-CM | POA: Diagnosis not present

## 2015-01-01 DIAGNOSIS — I10 Essential (primary) hypertension: Secondary | ICD-10-CM | POA: Diagnosis not present

## 2015-01-01 DIAGNOSIS — N183 Chronic kidney disease, stage 3 (moderate): Secondary | ICD-10-CM | POA: Diagnosis not present

## 2015-01-01 DIAGNOSIS — I5022 Chronic systolic (congestive) heart failure: Secondary | ICD-10-CM | POA: Diagnosis not present

## 2015-01-01 DIAGNOSIS — F0390 Unspecified dementia without behavioral disturbance: Secondary | ICD-10-CM | POA: Diagnosis not present

## 2015-01-01 DIAGNOSIS — K5900 Constipation, unspecified: Secondary | ICD-10-CM | POA: Diagnosis not present

## 2015-01-01 DIAGNOSIS — I739 Peripheral vascular disease, unspecified: Secondary | ICD-10-CM | POA: Diagnosis not present

## 2015-01-01 NOTE — Care Management Note (Unsigned)
    Page 1 of 1   01/01/2015     5:07:30 PM CARE MANAGEMENT NOTE 01/01/2015  Patient:  Ann [name], Ann [name]   Account Number:  [id number]  Date Initiated:  01/01/2015  Documentation initiated by:  Christin Moline  Subjective/Objective Assessment:   Pt adm on 12/29/14 with hypertensive encephalopathy.  PTA, pt resides at home with daughter.     Action/Plan:   PT/OT recommending SNF at dc.  Will consult CSW to facilitate poss dc to SNF when medically stable for dc.   Anticipated DC Date:  01/03/2015   Anticipated DC Plan:  SKILLED NURSING FACILITY  In-house referral  Clinical Social Worker      DC Planning Services  CM consult      Choice offered to / List presented to:             Status of service:  In process, will continue to follow Medicare Important Message given?  YES (If response is "NO", the following Medicare IM given date fields will be blank) Date Medicare IM given:  01/01/2015 Medicare IM given by:  Jacquette Canales Date Additional Medicare IM given:   Additional Medicare IM given by:    Discharge Disposition:    Per UR Regulation:  Reviewed for med. necessity/level of care/duration of stay  If discussed at Long Length of Stay Meetings, dates discussed:    Comments:

## 2015-01-01 NOTE — Progress Notes (Signed)
Discontinued IV; pt was already non-telemetry; called and gave report to receiving nurse at [facility].  Pt is in no acute distress.

## 2015-01-01 NOTE — Progress Notes (Signed)
Family Medicine Teaching Service Daily Progress Note Intern Pager: [phone]  Patient name: Ann [name] Medical record number: [id number] Date of birth: [birth year] Age: 81 y.o. Gender: female  Primary Care Provider: Baker Janus, DO Consultants: None Code Status: Full  Pt Overview and Major Events to Date:  4/25 - Admitted with AMS  Assessment and Plan: 81 y.o. female presenting with altered mental status . PMH is significant for dementia, HTN, hyperchlolsterolemia, unilateral BKA, h/o afib (on Xarelto)  # AMS: Dementia at baseline. Likely hypertensive encephalopathy (improvement in mental status after reduction of BP.) Must also consider stroke, ingestion, or occult infection or metabolic disorders.  - Troponin negative  - TSH wnl.  - UA unremarkable.  - Ethanol negative.  - UDS negative - RPR/HIV negative - Carbamazepine level within normal limits - B12 slightly decreased.  - Folate normal - Head CT negative for acute bleed.  - Control BP (see problem below) - SLP consulted - Dysphagia 3 diet recommended - PT/OT - Recommend SNF--Consult SW today to SNF placement  # HTN/Hypertensive Emergency. BP as high as 200s/140s on admission. Possibly due to non-adherence to medications. No evidence for CVA or intracranial process. Renal function wnl. Can consider hyperaldosteronism or pheochromocytoma, however now well controlled on home coreg. S/p nicardepine drip in ED - BP improved to 154/94 this morning, HR 67 - Coreg 3.125mg  BID, Amlodipine 10mg  daily - Consider additional agents if poorly controlled - Hydralazine prn  # CKD stage 3: Cr 1.17 on admission. Baseline 1-1.4. Likely related to poorly controlled HTN. - Creatinine 1.26, still at baseline - Continue to monitor  # Cough: Stable on room air. CXR clear. Likely secondary to viral URI - Continue to monitor  # HFpEF: Echo here with EF 50-55%, mild AI, MR, mild to moderate TR, mildly elevated pulmonary pressure, moderate  LVH. - Continue home coreg - Daily weights, strict I/Os - Add ARB as BP tolerates  # History of Afib: NSR on admission - Continue home amiodarone and xarelto  # T2DM: A1c 6.6 here. No prior diagnosis - Consider starting metformin as outpatient.   # HLD: Continue home lipitor 20mg  # Dementia: Continue home namenda, tegretol   FEN/GI: NPO pending SLP eval, NS @ 27ml/hr PPx: Home Xarelto  Disposition: Medically stable for discharge to SNF when bed available.   Subjective:  Feeling well today. Denies shortness of breath. Denies pain  Objective: Temp:  [97.6 F (36.4 C)-98.4 F (36.9 C)] 97.6 F (36.4 C) (04/27 0612) Pulse Rate:  [67-87] 67 (04/27 0612) Resp:  [18-20] 20 (04/27 0612) BP: (105-180)/(72-110) 154/94 mmHg (04/27 0612) SpO2:  [98 %-99 %] 99 % (04/27 0612) Weight:  [163 lb 2.3 oz (74 kg)] 163 lb 2.3 oz (74 kg) (04/27 0612) Physical Exam: General: Elderly female in NAD sitting in hospital bed eating breakfast Cardiovascular: RRR, no murmurs appreciated Respiratory: NWOB, Coarse breath sounds bilaterally, upper airway sounds transmitted Abdomen: +BS, soft, NT, ND Extremities: Right BKA, 3+ pitting edema  Neuro: Alert and conversational. Oriented to person and month. No focal neurological deficits. Speech delayed.   Laboratory/Imaging:  Recent Labs Lab 12/29/14 2105 12/30/14 0340 12/30/14 0900 12/31/14 0538  WBC 4.5  --  3.5* 3.6*  HGB 10.9*  --  10.1* 9.9*  HCT 33.9* 33.9* 31.1* 30.5*  PLT 172  --  164 152    Recent Labs Lab 12/29/14 2105 12/30/14 0900 12/31/14 0538  NA 138 139 138  K 3.8 3.8 4.1  CL 105 106 105  CO2 BUN 26* 23 27*  CREATININE 1.17* 0.99 1.26*  CALCIUM 8.3* 8.4 8.2*  PROT 6.9  --   --   BILITOT 0.4  --   --   ALKPHOS 122*  --   --   ALT 17  --   --   AST 23  --   --   GLUCOSE 81 92 87   Troponins negative   Carbamazepine level 10.6 A1c 6.6 Lipid panel: Total Cholesterol 152, TG 34, HDL 58, LDL 87 B12  165 Folate 231  RPR - Nonreactive HIV - Nonreactive  UDS negative  Araceli Bouche, DO 01/01/2015, 8:02 AM PGY-1, [facility] Family Medicine FPTS Intern pager: [phone], text pages welcome

## 2015-01-01 NOTE — Progress Notes (Signed)
Physical Therapy Treatment Patient Details Name: Ann [name] MRN: [id number] DOB: 10/01/1927 Today's Date: 01/01/2015    History of Present Illness Ann [name] is a 81 y.o. female adm  with Altered mental status and hypertensive emergency . PMH: dementia, HTN, hyperchlolsterolemia, unilateral BKA, h/o afib (on Xarelto)    PT Comments    Pt admitted with above diagnosis. Pt currently with functional limitations due to balance and endurance deficits. Pt could not safely perform transfer to 3N1 as she cannot effectively bear weight on left LE.  Pt soaked with urine therefore laid pt back and changed all linens and washed pt.  Pt was able to assist with rolling with rails on bed.  Continue PT.   Pt will benefit from skilled PT to increase their independence and safety with mobility to allow discharge to the venue listed below.    Follow Up Recommendations  SNF (unless family able to provide 24hr care)     Equipment Recommendations  None recommended by PT    Recommendations for Other Services       Precautions / Restrictions Precautions Precautions: Fall Restrictions Weight Bearing Restrictions: No    Mobility  Bed Mobility Overal bed mobility: Needs Assistance Bed Mobility: Rolling;Sidelying to Sit Rolling: Min assist;+2 for safety/equipment Sidelying to sit: Mod assist;+2 for physical assistance;HOB elevated   Sit to supine: Min assist   General bed mobility comments: requires multi-modal cues for task completion and increase time to complete, bed pad used to assist with scooting and coming to EOb.   Transfers Overall transfer level: Needs assistance Equipment used: 2 person hand held assist Transfers: Squat Pivot Transfers     Squat pivot transfers: +2 physical assistance;Total assist     General transfer comment: Pt was unable even with multiple attempts to transfer to 3N1.  Pt apparently used to drop arm commode and on unit there are no drop arm commodes.  Pt could  barely clear bottom off bed as left LE does not appear to be able to bear her weight for a transfer.  Pt soaked in urine.  Laid pt back down and cleaned all linens for pt and actually ended up bathing her as urine was covering most of body.  Pt positioned comfortably in bed at end of treatment.    Ambulation/Gait                 Stairs            Wheelchair Mobility    Modified Rankin (Stroke Patients Only)       Balance Overall balance assessment: Needs assistance;History of Falls Sitting-balance support: No upper extremity supported;Feet supported Sitting balance-Leahy Scale: Fair Sitting balance - Comments: can sit EOb without UE support and maintain balance.        Standing balance comment: unable                     Cognition Arousal/Alertness: Awake/alert Behavior During Therapy: WFL for tasks assessed/performed Overall Cognitive Status: No family/caregiver present to determine baseline cognitive functioning (hx of dementia)                      Exercises General Exercises - Lower Extremity Ankle Circles/Pumps: AROM;Left;5 reps;Supine Long Arc Quad: AROM;Left;5 reps;Seated Heel Slides: AROM;5 reps;Left;Supine Amputee Exercises Knee Flexion: AROM;Right;10 reps;Supine    General Comments        Pertinent Vitals/Pain Pain Assessment: No/denies pain  VSS    Home Living  Prior Function            PT Goals (current goals can now be found in the care plan section) Progress towards PT goals: Progressing toward goals    Frequency  Min 3X/week    PT Plan Current plan remains appropriate    Co-evaluation             End of Session Equipment Utilized During Treatment: Gait belt Activity Tolerance: Patient limited by fatigue Patient left: in bed;with call bell/phone within reach;with bed alarm set     Time: 1343-1417 PT Time Calculation (min) (ACUTE ONLY): 34 min  Charges:  $Therapeutic  Activity: 8-22 mins $Self Care/Home Management: 8-22                    G CodesBerline [name] 01/18/2015, 2:48 PM Ann [name],PT Acute Rehabilitation [phone] (pager)

## 2015-01-03 ENCOUNTER — Non-Acute Institutional Stay (SKILLED_NURSING_FACILITY): Payer: Medicare Other | Admitting: Adult Health

## 2015-01-03 DIAGNOSIS — F0390 Unspecified dementia without behavioral disturbance: Secondary | ICD-10-CM

## 2015-01-03 DIAGNOSIS — I161 Hypertensive emergency: Secondary | ICD-10-CM

## 2015-01-03 DIAGNOSIS — I4891 Unspecified atrial fibrillation: Secondary | ICD-10-CM

## 2015-01-03 DIAGNOSIS — I6789 Other cerebrovascular disease: Secondary | ICD-10-CM | POA: Diagnosis not present

## 2015-01-03 DIAGNOSIS — K5900 Constipation, unspecified: Secondary | ICD-10-CM

## 2015-01-03 DIAGNOSIS — I10 Essential (primary) hypertension: Secondary | ICD-10-CM

## 2015-01-03 DIAGNOSIS — Z89511 Acquired absence of right leg below knee: Secondary | ICD-10-CM | POA: Diagnosis not present

## 2015-01-07 ENCOUNTER — Encounter: Payer: Self-pay | Admitting: Internal Medicine

## 2015-01-07 ENCOUNTER — Non-Acute Institutional Stay (SKILLED_NURSING_FACILITY): Payer: Medicare Other | Admitting: Internal Medicine

## 2015-01-07 DIAGNOSIS — R062 Wheezing: Secondary | ICD-10-CM

## 2015-01-07 DIAGNOSIS — F0390 Unspecified dementia without behavioral disturbance: Secondary | ICD-10-CM

## 2015-01-07 DIAGNOSIS — E440 Moderate protein-calorie malnutrition: Secondary | ICD-10-CM

## 2015-01-07 DIAGNOSIS — I10 Essential (primary) hypertension: Secondary | ICD-10-CM

## 2015-01-07 DIAGNOSIS — R569 Unspecified convulsions: Secondary | ICD-10-CM

## 2015-01-07 DIAGNOSIS — I739 Peripheral vascular disease, unspecified: Secondary | ICD-10-CM

## 2015-01-07 DIAGNOSIS — I480 Paroxysmal atrial fibrillation: Secondary | ICD-10-CM

## 2015-01-07 DIAGNOSIS — Z8673 Personal history of transient ischemic attack (TIA), and cerebral infarction without residual deficits: Secondary | ICD-10-CM

## 2015-01-07 DIAGNOSIS — Z89511 Acquired absence of right leg below knee: Secondary | ICD-10-CM | POA: Diagnosis not present

## 2015-01-07 DIAGNOSIS — F1010 Alcohol abuse, uncomplicated: Secondary | ICD-10-CM | POA: Diagnosis not present

## 2015-01-07 DIAGNOSIS — I5022 Chronic systolic (congestive) heart failure: Secondary | ICD-10-CM

## 2015-01-07 NOTE — Progress Notes (Signed)
Patient ID: Ann [name], female   DOB: [birth year], 81 y.o.   MRN: [id number]    HISTORY AND PHYSICAL   DATE: 01/07/15  Location:  [facility]    Place of Service: SNF [phone])   Extended Emergency Contact Information Primary Emergency Contact: Febo,Beulah Address: [place], [place] [address] [place] Phone: [phone] Work Phone: [phone] Relation: Daughter Secondary Emergency Contact: Genelle Bal [place] Phone: [phone] Relation: Grandaughter  Advanced Directive information  [place]  Chief Complaint  Patient presents with  . New Admit To SNF    HPI:  81 yo female seen today as a new admission into SNF following hospital stay for [facility] emergency, CHF. Head CT revealed chronic small vessel changes but nothing acute. 2D echo showed EF 50-55%, mod LVH, mod dilated LA, mild-mod TR and mild increased pulm artery pressures. CXR showed no acute changes but did have cardiomegaly and small volume of fluid in minor fissure, clinical significance undetermined.   BP stable on coreg and amlodipine.  PAF rate controlled on amiodarone. xeralto is for anticoagulation  dementia stable on namenda   No seizures observed since admission  She is a poor historian due to dementia. Hx obtained from chart.  Past Medical History  Diagnosis Date  . Hypertension   . Dementia   . Hyperlipidemia   . Seizures   . Metatarsal bone fracture     Right - base of 5th  . DVT (deep venous thrombosis) 02/22/2013    02/18/2013: Left lower extremity (femoral and popliteal) subacute Treated with coumadin x 6-7 months    . Stroke   . Trigeminal neuralgia 11/03/2006    Qualifier: Diagnosis of  By: Eusebio Friendly    . CATARACT 11/03/2006    Qualifier: Diagnosis of  By: Eusebio Friendly    . HEARING LOSS NOS OR DEAFNESS 11/03/2006    Qualifier: Diagnosis of  By: Eusebio Friendly    . INCONTINENCE, URGE  11/03/2006    Qualifier: Diagnosis of  By: Eusebio Friendly    . Gangrene   . Atrial fibrillation   . Shortness of breath dyspnea     Past Surgical History  Procedure Laterality Date  . Toe surgery      right 5th toe amputation  . Amputation Right 10/26/2013    Procedure: AMPUTATION BELOW KNEE;  Surgeon: Newt Minion, MD;  Location: [facility];  Service: Orthopedics;  Laterality: Right;  Right Below Knee Amputation  . Abdominal aortagram N/A 09/13/2013    Procedure: ABDOMINAL Maxcine Ham;  Surgeon: Conrad Weber, MD;  Location: [facility] CATH LAB;  Service: Cardiovascular;  Laterality: N/A;  . Below knee leg amputation Right 10/2013    Patient Care Team: Katheren Shams, DO as PCP - General Lupita Dawn, MD as Attending Physician (Family Medicine)  History   Social History  . Marital Status: Widowed    Spouse Name: N/A  . Number of Children: N/A  . Years of Education: N/A   Occupational History  . Not on file.   Social History Main Topics  . Smoking status: Never Smoker   . Smokeless tobacco: Never Used  . Alcohol Use: No  . Drug Use: No  . Sexual Activity: Not on file   Other Topics Concern  . Not on file   Social History Narrative     reports that she has never smoked. She has never  used smokeless tobacco. She reports that she does not drink alcohol or use illicit drugs.  Family History  Problem Relation Age of Onset  . Hypertension Mother   . Arthritis Father   . Heart disease Daughter     Heart Disease before age 33  . Hyperlipidemia Daughter   . Hypertension Daughter    Family Status  Relation Status Death Age  . Mother Deceased   . Father Deceased   . Sister Deceased   . Brother Alive   . Daughter Alive   . Brother Marketing executive History  Administered Date(s) Administered  . Influenza Split 10/07/2011  . Influenza Whole 07/26/2007  . Influenza,inj,Quad PF,36+ Mos 06/20/2013  . Pneumococcal Polysaccharide-23 10/08/1999, 07/26/2007, 10/29/2013  . Td  01/04/1994  . Tdap 10/07/2011    Allergies  Allergen Reactions  . Lisinopril Other (See Comments)    Possible Angioedema on 11/16/13 admission.    Medications: Patient's Medications  New Prescriptions   No medications on file  Previous Medications   AMIODARONE (PACERONE) 200 MG TABLET    TAKE 1 TABLET BY MOUTH TWICE DAILY   AMLODIPINE (NORVASC) 10 MG TABLET    Take 1 tablet (10 mg total) by mouth daily.   CARVEDILOL (COREG) 3.125 MG TABLET    TAKE 1 TABLET BY MOUTH TWICE DAILY WITH A MEAL   MEMANTINE (NAMENDA) 10 MG TABLET    Take 1 tablet (10 mg total) by mouth 2 (two) times daily.   POLYETHYLENE GLYCOL (MIRALAX / GLYCOLAX) PACKET    Take 17 g by mouth daily as needed for moderate constipation.   XARELTO 15 MG TABS TABLET    TAKE 1 TABLET BY MOUTH DAILY  Modified Medications   No medications on file  Discontinued Medications   No medications on file    Review of Systems  Unable to perform ROS: Dementia    Filed Vitals:   01/07/15 1545  BP: 124/66  Pulse: 67  Temp: 98.5 F (36.9 C)  Weight: 161 lb (73.029 kg)   Body mass index is 31.44 kg/(m^2).  Physical Exam  Constitutional: She appears well-developed.  Frail appearing in NAD. No conversational dyspnea.  HENT:  Mouth/Throat: Oropharynx is clear and moist. No oropharyngeal exudate.  Eyes: Pupils are equal, round, and reactive to light. No scleral icterus.  Neck: Neck supple. Carotid bruit is not present. No tracheal deviation present. No thyromegaly present.  Cardiovascular: Normal rate and intact distal pulses.  An irregularly irregular rhythm present. Exam reveals no gallop and no friction rub.   Murmur heard.  Systolic murmur is present with a grade of 2/6  +1 pitting LLE edema. no calf TTP. Right BKA  Pulmonary/Chest: Effort normal. No stridor. No respiratory distress. She has no decreased breath sounds. She has wheezes (end expiratory b/l). She has no rhonchi. She has rales.  Abdominal: Soft. Bowel sounds are  normal. She exhibits no distension and no mass. There is no hepatomegaly. There is no tenderness. There is no rebound and no guarding.  Musculoskeletal:  Right BKA  Lymphadenopathy:    She has no cervical adenopathy.  Neurological: She is alert.  Skin: Skin is warm and dry. No rash noted.  Psychiatric: She has a normal mood and affect. Her behavior is normal.     Labs reviewed: Admission on 12/29/2014, Discharged on 01/01/2015  Component Date Value Ref Range Status  . WBC 12/29/2014 4.5  4.0 - 10.5 K/uL Final  . RBC 12/29/2014 4.02  3.87 -  5.11 MIL/uL Final  . Hemoglobin 12/29/2014 10.9* 12.0 - 15.0 g/dL Final  . HCT 12/29/2014 33.9* 36.0 - 46.0 % Final  . MCV 12/29/2014 84.3  78.0 - 100.0 fL Final  . MCH 12/29/2014 27.1  26.0 - 34.0 pg Final  . MCHC 12/29/2014 32.2  30.0 - 36.0 g/dL Final  . RDW 12/29/2014 16.7* 11.5 - 15.5 % Final  . Platelets 12/29/2014 172  150 - 400 K/uL Final  . Sodium 12/29/2014 138  135 - 145 mmol/L Final  . Potassium 12/29/2014 3.8  3.5 - 5.1 mmol/L Final  . Chloride 12/29/2014 105  96 - 112 mmol/L Final  . CO2 12/29/2014 25  19 - 32 mmol/L Final  . Glucose, Bld 12/29/2014 81  70 - 99 mg/dL Final  . BUN 12/29/2014 26* 6 - 23 mg/dL Final  . Creatinine, Ser 12/29/2014 1.17* 0.50 - 1.10 mg/dL Final  . Calcium 12/29/2014 8.3* 8.4 - 10.5 mg/dL Final  . Total Protein 12/29/2014 6.9  6.0 - 8.3 g/dL Final  . Albumin 12/29/2014 3.2* 3.5 - 5.2 g/dL Final  . AST 12/29/2014 23  0 - 37 U/L Final  . ALT 12/29/2014 17  0 - 35 U/L Final  . Alkaline Phosphatase 12/29/2014 122* 39 - 117 U/L Final  . Total Bilirubin 12/29/2014 0.4  0.3 - 1.2 mg/dL Final  . GFR calc non Af Amer 12/29/2014 42* >90 mL/min Final  . GFR calc Af Amer 12/29/2014 48* >90 mL/min Final   Comment: (NOTE) The eGFR has been calculated using the CKD EPI equation. This calculation has not been validated in all clinical situations. eGFR's persistently <90 mL/min signify possible Chronic  Kidney Disease.   . Anion gap 12/29/2014 8  5 - 15 Final  . Troponin I 12/29/2014 <0.03  <0.031 ng/mL Final   Comment:        NO INDICATION OF MYOCARDIAL INJURY.   . Color, Urine 12/30/2014 YELLOW  YELLOW Final  . APPearance 12/30/2014 CLOUDY* CLEAR Final  . Specific Gravity, Urine 12/30/2014 1.026  1.005 - 1.030 Final  . pH 12/30/2014 5.0  5.0 - 8.0 Final  . Glucose, UA 12/30/2014 NEGATIVE  NEGATIVE mg/dL Final  . Hgb urine dipstick 12/30/2014 NEGATIVE  NEGATIVE Final  . Bilirubin Urine 12/30/2014 NEGATIVE  NEGATIVE Final  . Ketones, ur 12/30/2014 NEGATIVE  NEGATIVE mg/dL Final  . Protein, ur 12/30/2014 30* NEGATIVE mg/dL Final  . Urobilinogen, UA 12/30/2014 1.0  0.0 - 1.0 mg/dL Final  . Nitrite 12/30/2014 NEGATIVE  NEGATIVE Final  . Leukocytes, UA 12/30/2014 NEGATIVE  NEGATIVE Final  . Specimen Description 12/30/2014 URINE, CATHETERIZED   Final  . Special Requests 12/30/2014 NONE   Final  . Colony Count 12/30/2014    Final                   Value:NO GROWTH Performed at [facility]   . Culture 12/30/2014    Final                   Value:NO GROWTH Performed at [facility]   . Report Status 12/30/2014 12/31/2014 FINAL   Final  . B Natriuretic Peptide 12/29/2014 614.3* 0.0 - 100.0 pg/mL Final  . Prothrombin Time 12/29/2014 26.1* 11.6 - 15.2 seconds Final  . INR 12/29/2014 2.37* 0.00 - 1.49 Final  . Squamous Epithelial / LPF 12/30/2014 RARE  RARE Final  . Casts 12/30/2014 HYALINE CASTS* NEGATIVE Final  . TSH 12/30/2014 2.809  0.350 - 4.500 uIU/mL  Final  . Folate, Hemolysate 12/30/2014 231.0  Not Estab. ng/mL Final  . Hematocrit 12/30/2014 33.9* 34.0 - 46.6 % Final  . Folate, RBC 12/30/2014 681  >498 ng/mL Final   Comment: (NOTE) Performed At: [facility] [place], [place] [id number] Lindon Romp MD TD:[id number]   . HIV Screen 4th Generation wRfx 12/30/2014 Non Reactive  Non Reactive Final   Comment: (NOTE) Performed At: [facility] [place], [place] [id number] Lindon Romp MD DV:[id number]   . RPR Ser Ql 12/30/2014 Non Reactive  Non Reactive Final   Comment: (NOTE) Performed At: [facility] [place], [place] [id number] Lindon Romp MD EV:[id number]   . Opiates 12/30/2014 NONE DETECTED  NONE DETECTED Final  . Cocaine 12/30/2014 NONE DETECTED  NONE DETECTED Final  . Benzodiazepines 12/30/2014 NONE DETECTED  NONE DETECTED Final  . Amphetamines 12/30/2014 NONE DETECTED  NONE DETECTED Final  . Tetrahydrocannabinol 12/30/2014 NONE DETECTED  NONE DETECTED Final  . Barbiturates 12/30/2014 NONE DETECTED  NONE DETECTED Final   Comment:        DRUG SCREEN FOR MEDICAL PURPOSES ONLY.  IF CONFIRMATION IS NEEDED FOR ANY PURPOSE, NOTIFY LAB WITHIN 5 DAYS.        LOWEST DETECTABLE LIMITS FOR URINE DRUG SCREEN Drug Class       Cutoff (ng/mL) Amphetamine      1000 Barbiturate      200 Benzodiazepine   299 Tricyclics       371 Opiates          300 Cocaine          300 THC              50   . Alcohol, Ethyl (B) 12/30/2014 <5  0 - 9 mg/dL Final   Comment:        LOWEST DETECTABLE LIMIT FOR SERUM ALCOHOL IS 11 mg/dL FOR MEDICAL PURPOSES ONLY   . Vitamin B-12 12/30/2014 165* 211 - 911 pg/mL Final   Performed at [facility]  . Cholesterol 12/30/2014 152  0 - 200 mg/dL Final  . Triglycerides 12/30/2014 34  <150 mg/dL Final  . HDL 12/30/2014 58  >39 mg/dL Final  . Total CHOL/HDL Ratio 12/30/2014 2.6   Final  . VLDL 12/30/2014 7  0 - 40 mg/dL Final  . LDL Cholesterol 12/30/2014 87  0 - 99 mg/dL Final   Comment:        Total Cholesterol/HDL:CHD Risk Coronary Heart Disease Risk Table                     Men   Women  1/2 Average Risk   3.4   3.3  Average Risk       5.0   4.4  2 X Average Risk   9.6   7.1  3 X Average Risk  23.4   11.0        Use the calculated Patient Ratio above and the CHD Risk Table to determine the patient's CHD Risk.         ATP III CLASSIFICATION (LDL):  <100     mg/dL   Optimal  100-129  mg/dL   Near or Above                    Optimal  130-159  mg/dL   Borderline  160-189  mg/dL   High  >190     mg/dL  Very High   . Hgb A1c MFr Bld 12/30/2014 6.6* 4.8 - 5.6 % Final   Comment: (NOTE)         Pre-diabetes: 5.7 - 6.4         Diabetes: >6.4         Glycemic control for adults with diabetes: <7.0   . Mean Plasma Glucose 12/30/2014 143   Final   Comment: (NOTE) Performed At: [facility] [place], [place] [id number] Lindon Romp MD XF:[id number]   . Sodium 12/30/2014 139  135 - 145 mmol/L Final  . Potassium 12/30/2014 3.8  3.5 - 5.1 mmol/L Final  . Chloride 12/30/2014 106  96 - 112 mmol/L Final  . CO2 12/30/2014 24  19 - 32 mmol/L Final  . Glucose, Bld 12/30/2014 92  70 - 99 mg/dL Final  . BUN 12/30/2014 23  6 - 23 mg/dL Final  . Creatinine, Ser 12/30/2014 0.99  0.50 - 1.10 mg/dL Final  . Calcium 12/30/2014 8.4  8.4 - 10.5 mg/dL Final  . GFR calc non Af Amer 12/30/2014 51* >90 mL/min Final  . GFR calc Af Amer 12/30/2014 59* >90 mL/min Final   Comment: (NOTE) The eGFR has been calculated using the CKD EPI equation. This calculation has not been validated in all clinical situations. eGFR's persistently <90 mL/min signify possible Chronic Kidney Disease.   . Anion gap 12/30/2014 9  5 - 15 Final  . WBC 12/30/2014 3.5* 4.0 - 10.5 K/uL Final  . RBC 12/30/2014 3.67* 3.87 - 5.11 MIL/uL Final  . Hemoglobin 12/30/2014 10.1* 12.0 - 15.0 g/dL Final  . HCT 12/30/2014 31.1* 36.0 - 46.0 % Final  . MCV 12/30/2014 84.7  78.0 - 100.0 fL Final  . MCH 12/30/2014 27.5  26.0 - 34.0 pg Final  . MCHC 12/30/2014 32.5  30.0 - 36.0 g/dL Final  . RDW 12/30/2014 16.5* 11.5 - 15.5 % Final  . Platelets 12/30/2014 164  150 - 400 K/uL Final  . Carbamazepine Lvl 12/30/2014 10.6  4.0 - 12.0 ug/mL Final  . Sodium 12/31/2014 138  135 - 145 mmol/L Final  . Potassium 12/31/2014 4.1  3.5 - 5.1 mmol/L  Final  . Chloride 12/31/2014 105  96 - 112 mmol/L Final  . CO2 12/31/2014 25  19 - 32 mmol/L Final  . Glucose, Bld 12/31/2014 87  70 - 99 mg/dL Final  . BUN 12/31/2014 27* 6 - 23 mg/dL Final  . Creatinine, Ser 12/31/2014 1.26* 0.50 - 1.10 mg/dL Final  . Calcium 12/31/2014 8.2* 8.4 - 10.5 mg/dL Final  . GFR calc non Af Amer 12/31/2014 38* >90 mL/min Final  . GFR calc Af Amer 12/31/2014 44* >90 mL/min Final   Comment: (NOTE) The eGFR has been calculated using the CKD EPI equation. This calculation has not been validated in all clinical situations. eGFR's persistently <90 mL/min signify possible Chronic Kidney Disease.   . Anion gap 12/31/2014 8  5 - 15 Final  . WBC 12/31/2014 3.6* 4.0 - 10.5 K/uL Final  . RBC 12/31/2014 3.66* 3.87 - 5.11 MIL/uL Final  . Hemoglobin 12/31/2014 9.9* 12.0 - 15.0 g/dL Final  . HCT 12/31/2014 30.5* 36.0 - 46.0 % Final  . MCV 12/31/2014 83.3  78.0 - 100.0 fL Final  . MCH 12/31/2014 27.0  26.0 - 34.0 pg Final  . MCHC 12/31/2014 32.5  30.0 - 36.0 g/dL Final  . RDW 12/31/2014 16.7* 11.5 - 15.5 % Final  . Platelets 12/31/2014 152  150 -  400 K/uL Final    Dg Chest 2 View  12/29/2014   CLINICAL DATA:  81 year old female with several week history of shortness of breath  EXAM: CHEST  2 VIEW  COMPARISON:  Prior chest x-ray 12/03/2013  FINDINGS: Stable cardiomegaly. Ectatic and tortuous thoracic aorta. Similar appearance of diffuse thickening of the paratracheal stripes likely reflecting prominent and tortuous great vessels. Slightly increased pulmonary vascular congestion with small volume fluid in the minor fissure. Chronic linear atelectasis versus scarring in the left mid and inferior lung. No pleural effusion or pneumothorax. No acute osseous abnormality.  IMPRESSION: 1. Very similar appearance of the chest compared to prior imaging. There is small volume fluid within the minor fissure on today's examination which is of uncertain clinical significance. No other  evidence of pleural effusion. 2. Stable cardiomegaly and mild vascular congestion without evidence of edema. 3. Stable chronic atelectasis versus scarring in the left mid and lower lung.   Electronically Signed   By: Jacqulynn Cadet M.D.   On: 12/29/2014 22:24   Ct Head Wo Contrast  12/30/2014   CLINICAL DATA:  Acute onset of confusion. Change in breathing over the past week. Altered mental status and worsening weakness. Initial encounter.  EXAM: CT HEAD WITHOUT CONTRAST  TECHNIQUE: Contiguous axial images were obtained from the base of the skull through the vertex without intravenous contrast.  COMPARISON:  CT of the head performed 11/19/2013, and MRI of the brain performed 08/25/2012  FINDINGS: There is no evidence of acute infarction, mass lesion, or intra- or extra-axial hemorrhage on CT.  Prominence of the ventricles and sulci reflects mild to moderate cortical volume loss. Mild cerebellar atrophy is noted. Diffuse periventricular and subcortical white matter change likely reflects small vessel ischemic microangiopathy. Chronic ischemic change is noted at the basal ganglia and thalami bilaterally.  The brainstem and fourth ventricle are within normal limits. The cerebral hemispheres demonstrate grossly normal gray-white differentiation. No mass effect or midline shift is seen.  There is no evidence of fracture; small bilateral craniectomy defects are noted at the temporal regions. The visualized portions of the orbits are within normal limits. Dense opacification is noted within the right maxillary sinus and right-sided nasal passages, likely reflecting inspissated mucus, with underlying mucoperiosteal thickening. There is similar opacification in 2013, on the prior MRI. Paranasal sinuses and mastoid air cells are well-aerated. No significant soft tissue abnormalities are seen.  IMPRESSION: 1. No acute intracranial pathology seen on CT. 2. Mild to moderate cortical volume loss and diffuse small vessel  ischemic microangiopathy. 3. Chronic ischemic change at the basal ganglia and thalami bilaterally. 4. Dense opacification within the right maxillary sinus and right-sided nasal passages, likely reflecting inspissated mucus, with underlying mucoperiosteal thickening. There was a similar appearance on the prior MRI from 2013.   Electronically Signed   By: Garald Balding M.D.   On: 12/30/2014 00:44     Assessment/Plan   ICD-9-CM ICD-10-CM   1. Wheezing - probably due to #5 786.07 R06.2   2. Essential hypertension - stable; s/p HTNsive emergency 401.9 I10   3. Dementia without behavioral disturbance - stable 294.20 F03.90   4. Paroxysmal a-fib - rate controlled 427.31 I48.0   5. Chronic systolic CHF (congestive heart failure) - stable 428.22 I50.22    428.0    6. Alcohol abuse - in remission 305.00 F10.10   7. PAD (peripheral artery disease) s/p right BKA 443.9 I73.9   8. History of stroke - stable V12.54 Z86.73   9. Moderate  malnutrition 263.0 E44.0   10. Convulsions, unspecified convulsion type - probably related to #8; stable 780.39 R56.9   11. S/P BKA (below knee amputation) unilateral, right due to PAD V49.75 Z89.511     --Rx duonebs TID for wheezing/SOB x 2 weeks then use prn. If no better, will consider repeating CXR +/- CT chest. Last CXR in 12/2014 showed no acute changes but did reveal chronic lung changes as well as enlarged heart  --cont other meds as ordered  --PT/OT/ST as ordered  --BP check qshift and record  --GOAL: short term rehab and d/c home when medically appropriate. Communicated with pt and nursing.  --will follow  Khalid Lacko S. Perlie Gold  [facility] [address] [place], [place] [address] [phone] Cell (Monday-Friday 8 AM - 5 PM) [phone] After 5 PM and follow prompts

## 2015-01-09 IMAGING — CR DG CHEST 2V
1 series · 1 of 1 positions shown · non-contrast
Comparison: 11/18/2013

CLINICAL DATA: Possible aspiration

EXAM:
CHEST  2 VIEW

[w chest lat]
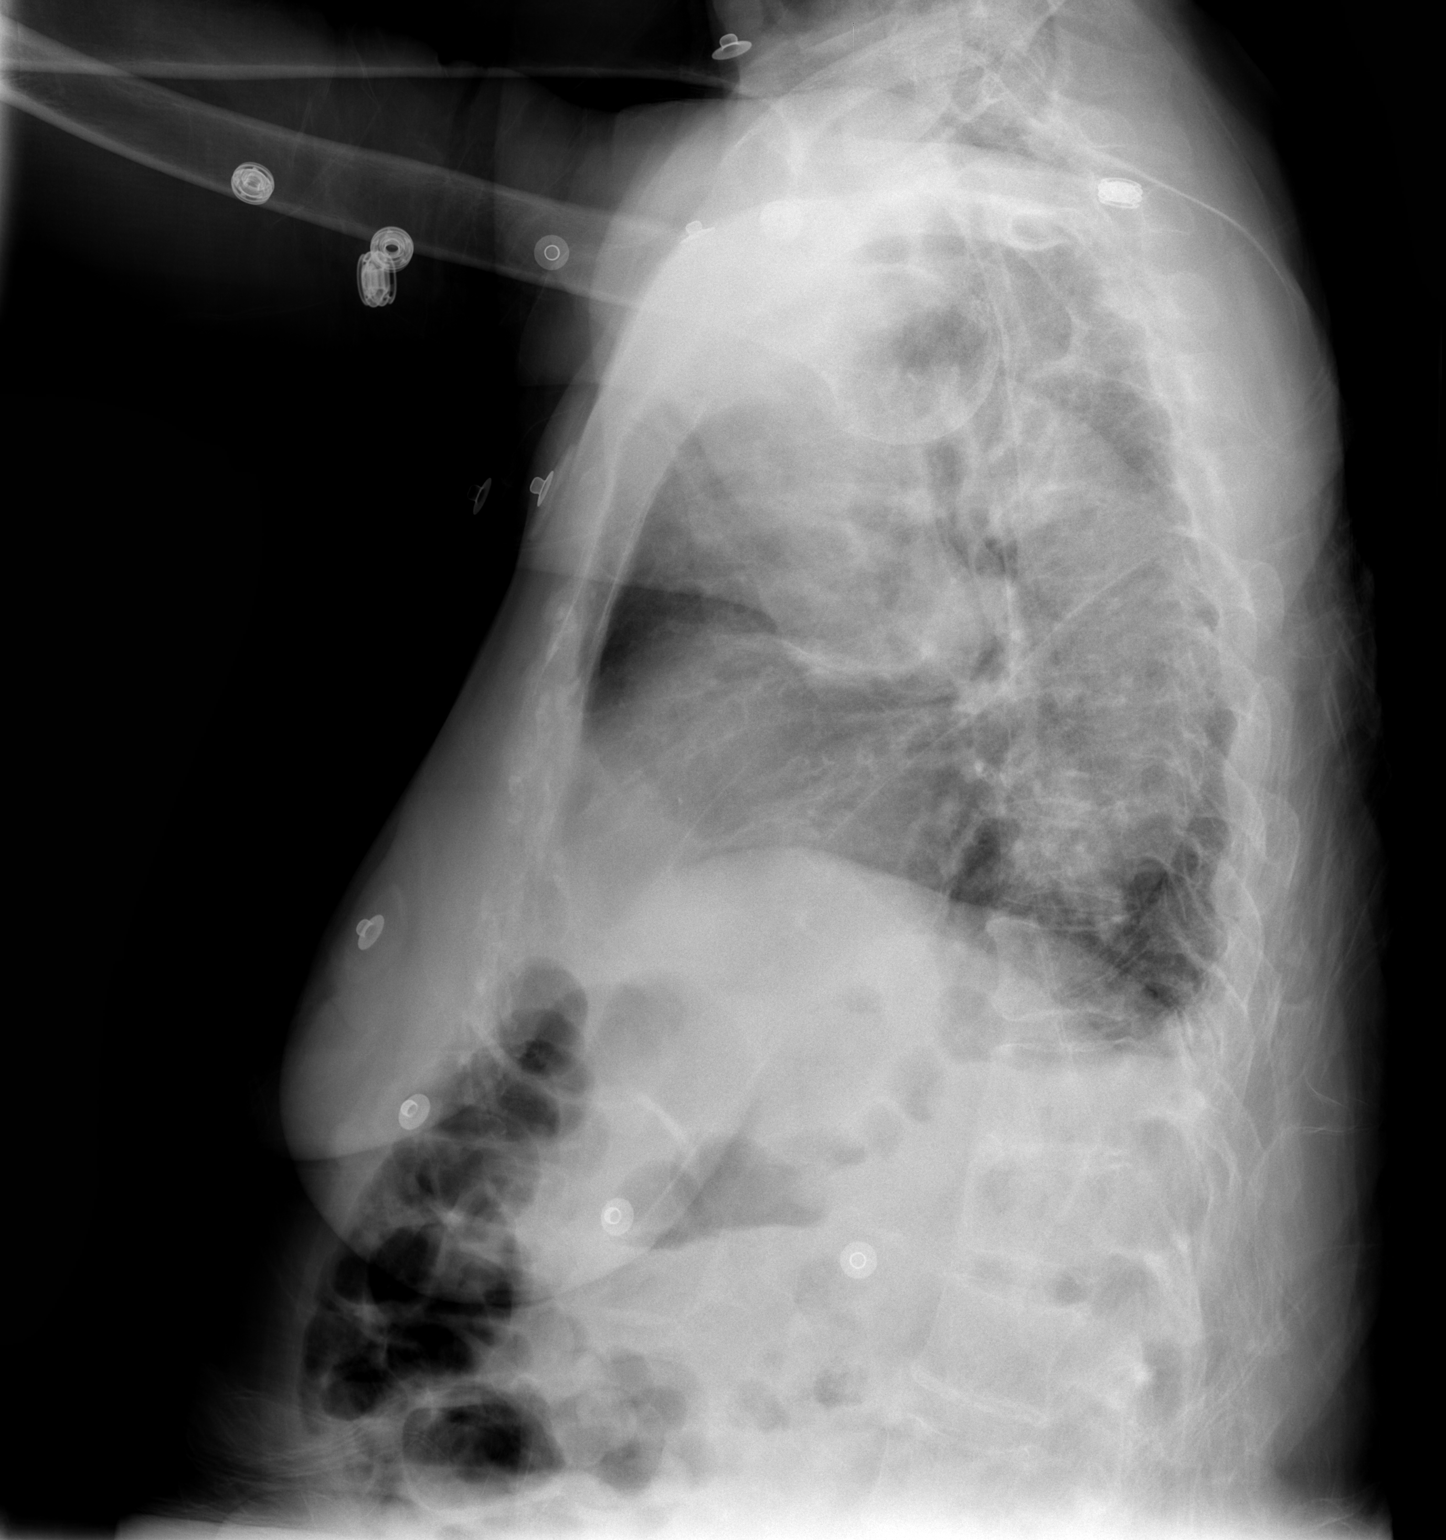

[1 of 1 positions shown; findings below may reference images not displayed]

FINDINGS: Mild patchy left lower lobe opacity, likely atelectasis/scarring
when correlating with the lateral view, although pneumonia/
aspiration is not entirely excluded.

Mild linear scarring in the right middle lung and lingula. No
pleural effusion or pneumothorax.

Cardiomegaly.

Degenerative changes of the visualized thoracolumbar spine.
IMPRESSION: Mild patchy left lower lobe opacity, likely atelectasis/scarring.
Pneumonia/aspiration is not entirely excluded.

## 2015-01-21 ENCOUNTER — Non-Acute Institutional Stay (SKILLED_NURSING_FACILITY): Payer: Medicare Other | Admitting: Internal Medicine

## 2015-01-21 ENCOUNTER — Encounter: Payer: Self-pay | Admitting: Internal Medicine

## 2015-01-21 DIAGNOSIS — Z89511 Acquired absence of right leg below knee: Secondary | ICD-10-CM

## 2015-01-21 DIAGNOSIS — I10 Essential (primary) hypertension: Secondary | ICD-10-CM

## 2015-01-21 DIAGNOSIS — F1010 Alcohol abuse, uncomplicated: Secondary | ICD-10-CM | POA: Diagnosis not present

## 2015-01-21 DIAGNOSIS — Z8673 Personal history of transient ischemic attack (TIA), and cerebral infarction without residual deficits: Secondary | ICD-10-CM

## 2015-01-21 DIAGNOSIS — I480 Paroxysmal atrial fibrillation: Secondary | ICD-10-CM | POA: Diagnosis not present

## 2015-01-21 DIAGNOSIS — F0390 Unspecified dementia without behavioral disturbance: Secondary | ICD-10-CM

## 2015-01-21 DIAGNOSIS — I5022 Chronic systolic (congestive) heart failure: Secondary | ICD-10-CM | POA: Diagnosis not present

## 2015-01-21 DIAGNOSIS — I739 Peripheral vascular disease, unspecified: Secondary | ICD-10-CM | POA: Diagnosis not present

## 2015-01-21 NOTE — Progress Notes (Signed)
Patient ID: Ann [name], female   DOB: 10/28/1927, 81 y.o.   MRN: [id number]    DATE: 01/21/15  Location:  [facility]    Place of Service: SNF [phone])   Extended Emergency Contact Information Primary Emergency Contact: Vasko,Beulah Address: [place], [place] [address] [place] Phone: [phone] Work Phone: [phone] Relation: Daughter Secondary Emergency Contact: [place] Phone: [phone] Relation: Grandaughter  Advanced Directive information  Landa  Chief Complaint  Patient presents with  . Discharge Note    HPI:  81 yo female seen today for d/c from SNF following short term rehab. She will be d/c'd home with home health RN/PT/OT. She is medically stable to be d/c'd  RECAP:  She was hospitalized and sent to SNF after [facility] emergency and CHF. Head CT revealed chronic small vessel changes but nothing acute. 2D echo showed EF 50-55%, mod LVH, mod dilated LA, mild-mod TR and mild increased pulm artery pressures. CXR showed no acute changes but did have cardiomegaly and small volume of fluid in minor fissure, clinical significance undetermined.  Past Medical History  Diagnosis Date  . Hypertension   . Dementia   . Hyperlipidemia   . Seizures   . Metatarsal bone fracture     Right - base of 5th  . DVT (deep venous thrombosis) 02/22/2013    02/18/2013: Left lower extremity (femoral and popliteal) subacute Treated with coumadin x 6-7 months    . Stroke   . Trigeminal neuralgia 11/03/2006    Qualifier: Diagnosis of  By: Eusebio Friendly    . CATARACT 11/03/2006    Qualifier: Diagnosis of  By: Eusebio Friendly    . HEARING LOSS NOS OR DEAFNESS 11/03/2006    Qualifier: Diagnosis of  By: Eusebio Friendly    . INCONTINENCE, URGE 11/03/2006    Qualifier: Diagnosis of  By: Eusebio Friendly    . Gangrene   . Atrial fibrillation   . Shortness of breath dyspnea     Past  Surgical History  Procedure Laterality Date  . Toe surgery      right 5th toe amputation  . Amputation Right 10/26/2013    Procedure: AMPUTATION BELOW KNEE;  Surgeon: Newt Minion, MD;  Location: [place];  Service: Orthopedics;  Laterality: Right;  Right Below Knee Amputation  . Abdominal aortagram N/A 09/13/2013    Procedure: ABDOMINAL Maxcine Ham;  Surgeon: Conrad Lublin, MD;  Location: [facility] CATH LAB;  Service: Cardiovascular;  Laterality: N/A;  . Below knee leg amputation Right 10/2013    Patient Care Team: Katheren Shams, DO as PCP - General Lupita Dawn, MD as Attending Physician (Family Medicine)  History   Social History  . Marital Status: Widowed    Spouse Name: N/A  . Number of Children: N/A  . Years of Education: N/A   Occupational History  . Not on file.   Social History Main Topics  . Smoking status: Never Smoker   . Smokeless tobacco: Never Used  . Alcohol Use: No  . Drug Use: No  . Sexual Activity: Not on file   Other Topics Concern  . Not on file   Social History Narrative     reports that she has never smoked. She has never used smokeless tobacco. She reports that she does not drink alcohol or use illicit drugs.  Immunization History  Administered Date(s) Administered  .  Influenza Split 10/07/2011  . Influenza Whole 07/26/2007  . Influenza,inj,Quad PF,36+ Mos 06/20/2013  . Pneumococcal Polysaccharide-23 10/08/1999, 07/26/2007, 10/29/2013  . Td 01/04/1994  . Tdap 10/07/2011    Allergies  Allergen Reactions  . Lisinopril Other (See Comments)    Possible Angioedema on 11/16/13 admission.    Medications: Patient's Medications  New Prescriptions   No medications on file  Previous Medications   AMIODARONE (PACERONE) 200 MG TABLET    TAKE 1 TABLET BY MOUTH TWICE DAILY   AMLODIPINE (NORVASC) 10 MG TABLET    Take 1 tablet (10 mg total) by mouth daily.   CARVEDILOL (COREG) 3.125 MG TABLET    TAKE 1 TABLET BY MOUTH TWICE DAILY WITH A MEAL   MEMANTINE  (NAMENDA) 10 MG TABLET    Take 1 tablet (10 mg total) by mouth 2 (two) times daily.   POLYETHYLENE GLYCOL (MIRALAX / GLYCOLAX) PACKET    Take 17 g by mouth daily as needed for moderate constipation.   XARELTO 15 MG TABS TABLET    TAKE 1 TABLET BY MOUTH DAILY  Modified Medications   No medications on file  Discontinued Medications   No medications on file    Review of Systems  Unable to perform ROS: Dementia    Filed Vitals:   01/21/15 1559  BP: 107/71  Pulse: 73  Temp: 97.3 F (36.3 C)  Weight: 163 lb (73.936 kg)  SpO2: 93%   Body mass index is 31.83 kg/(m^2).  Physical Exam  HENT:  Mouth/Throat: Oropharynx is clear and moist. No oropharyngeal exudate.  Eyes: Pupils are equal, round, and reactive to light. No scleral icterus.  Neck: Neck supple. No tracheal deviation present.  Cardiovascular: Normal rate, regular rhythm, normal heart sounds and intact distal pulses.  Exam reveals no gallop and no friction rub.   No murmur heard. +1 pitting LLE edema. No calf TTP  Pulmonary/Chest: Effort normal and breath sounds normal. No respiratory distress. She has no wheezes. She has no rales.  Abdominal: Soft. Bowel sounds are normal. She exhibits no distension and no mass. There is no tenderness. There is no rebound and no guarding.  Musculoskeletal:  Right AKA  Lymphadenopathy:    She has no cervical adenopathy.  Neurological: She is alert.  Skin: Skin is warm and dry. No rash noted.  Psychiatric: She has a normal mood and affect. Her behavior is normal.     Labs reviewed: Admission on 12/29/2014, Discharged on 01/01/2015  Component Date Value Ref Range Status  . WBC 12/29/2014 4.5  4.0 - 10.5 K/uL Final  . RBC 12/29/2014 4.02  3.87 - 5.11 MIL/uL Final  . Hemoglobin 12/29/2014 10.9* 12.0 - 15.0 g/dL Final  . HCT 12/29/2014 33.9* 36.0 - 46.0 % Final  . MCV 12/29/2014 84.3  78.0 - 100.0 fL Final  . MCH 12/29/2014 27.1  26.0 - 34.0 pg Final  . MCHC 12/29/2014 32.2  30.0 - 36.0  g/dL Final  . RDW 12/29/2014 16.7* 11.5 - 15.5 % Final  . Platelets 12/29/2014 172  150 - 400 K/uL Final  . Sodium 12/29/2014 138  135 - 145 mmol/L Final  . Potassium 12/29/2014 3.8  3.5 - 5.1 mmol/L Final  . Chloride 12/29/2014 105  96 - 112 mmol/L Final  . CO2 12/29/2014 25  19 - 32 mmol/L Final  . Glucose, Bld 12/29/2014 81  70 - 99 mg/dL Final  . BUN 12/29/2014 26* 6 - 23 mg/dL Final  . Creatinine, Ser 12/29/2014 1.17* 0.50 -  1.10 mg/dL Final  . Calcium 12/29/2014 8.3* 8.4 - 10.5 mg/dL Final  . Total Protein 12/29/2014 6.9  6.0 - 8.3 g/dL Final  . Albumin 12/29/2014 3.2* 3.5 - 5.2 g/dL Final  . AST 12/29/2014 23  0 - 37 U/L Final  . ALT 12/29/2014 17  0 - 35 U/L Final  . Alkaline Phosphatase 12/29/2014 122* 39 - 117 U/L Final  . Total Bilirubin 12/29/2014 0.4  0.3 - 1.2 mg/dL Final  . GFR calc non Af Amer 12/29/2014 42* >90 mL/min Final  . GFR calc Af Amer 12/29/2014 48* >90 mL/min Final   Comment: (NOTE) The eGFR has been calculated using the CKD EPI equation. This calculation has not been validated in all clinical situations. eGFR's persistently <90 mL/min signify possible Chronic Kidney Disease.   . Anion gap 12/29/2014 8  5 - 15 Final  . Troponin I 12/29/2014 <0.03  <0.031 ng/mL Final   Comment:        NO INDICATION OF MYOCARDIAL INJURY.   . Color, Urine 12/30/2014 YELLOW  YELLOW Final  . APPearance 12/30/2014 CLOUDY* CLEAR Final  . Specific Gravity, Urine 12/30/2014 1.026  1.005 - 1.030 Final  . pH 12/30/2014 5.0  5.0 - 8.0 Final  . Glucose, UA 12/30/2014 NEGATIVE  NEGATIVE mg/dL Final  . Hgb urine dipstick 12/30/2014 NEGATIVE  NEGATIVE Final  . Bilirubin Urine 12/30/2014 NEGATIVE  NEGATIVE Final  . Ketones, ur 12/30/2014 NEGATIVE  NEGATIVE mg/dL Final  . Protein, ur 12/30/2014 30* NEGATIVE mg/dL Final  . Urobilinogen, UA 12/30/2014 1.0  0.0 - 1.0 mg/dL Final  . Nitrite 12/30/2014 NEGATIVE  NEGATIVE Final  . Leukocytes, UA 12/30/2014 NEGATIVE  NEGATIVE Final  .  Specimen Description 12/30/2014 URINE, CATHETERIZED   Final  . Special Requests 12/30/2014 NONE   Final  . Colony Count 12/30/2014    Final                   Value:NO GROWTH Performed at [facility]   . Culture 12/30/2014    Final                   Value:NO GROWTH Performed at [facility]   . Report Status 12/30/2014 12/31/2014 FINAL   Final  . B Natriuretic Peptide 12/29/2014 614.3* 0.0 - 100.0 pg/mL Final  . Prothrombin Time 12/29/2014 26.1* 11.6 - 15.2 seconds Final  . INR 12/29/2014 2.37* 0.00 - 1.49 Final  . Squamous Epithelial / LPF 12/30/2014 RARE  RARE Final  . Casts 12/30/2014 HYALINE CASTS* NEGATIVE Final  . TSH 12/30/2014 2.809  0.350 - 4.500 uIU/mL Final  . Folate, Hemolysate 12/30/2014 231.0  Not Estab. ng/mL Final  . Hematocrit 12/30/2014 33.9* 34.0 - 46.6 % Final  . Folate, RBC 12/30/2014 681  >498 ng/mL Final   Comment: (NOTE) Performed At: [facility] [place], [place] [id number] Lindon Romp MD UU:[id number]   . HIV Screen 4th Generation wRfx 12/30/2014 Non Reactive  Non Reactive Final   Comment: (NOTE) Performed At: [facility] [place], [place] [id number] Lindon Romp MD OV:[id number]   . RPR Ser Ql 12/30/2014 Non Reactive  Non Reactive Final   Comment: (NOTE) Performed At: [facility] [place], [place] [id number] Lindon Romp MD ZS:[id number]   . Opiates 12/30/2014 NONE DETECTED  NONE DETECTED Final  . Cocaine 12/30/2014 NONE DETECTED  NONE DETECTED Final  . Benzodiazepines 12/30/2014 NONE DETECTED  NONE DETECTED Final  .  Amphetamines 12/30/2014 NONE DETECTED  NONE DETECTED Final  . Tetrahydrocannabinol 12/30/2014 NONE DETECTED  NONE DETECTED Final  . Barbiturates 12/30/2014 NONE DETECTED  NONE DETECTED Final   Comment:        DRUG SCREEN FOR MEDICAL PURPOSES ONLY.  IF CONFIRMATION IS NEEDED FOR ANY PURPOSE, NOTIFY LAB WITHIN 5 DAYS.        LOWEST  DETECTABLE LIMITS FOR URINE DRUG SCREEN Drug Class       Cutoff (ng/mL) Amphetamine      1000 Barbiturate      200 Benzodiazepine   010 Tricyclics       932 Opiates          300 Cocaine          300 THC              50   . Alcohol, Ethyl (B) 12/30/2014 <5  0 - 9 mg/dL Final   Comment:        LOWEST DETECTABLE LIMIT FOR SERUM ALCOHOL IS 11 mg/dL FOR MEDICAL PURPOSES ONLY   . Vitamin B-12 12/30/2014 165* 211 - 911 pg/mL Final   Performed at [facility]  . Cholesterol 12/30/2014 152  0 - 200 mg/dL Final  . Triglycerides 12/30/2014 34  <150 mg/dL Final  . HDL 12/30/2014 58  >39 mg/dL Final  . Total CHOL/HDL Ratio 12/30/2014 2.6   Final  . VLDL 12/30/2014 7  0 - 40 mg/dL Final  . LDL Cholesterol 12/30/2014 87  0 - 99 mg/dL Final   Comment:        Total Cholesterol/HDL:CHD Risk Coronary Heart Disease Risk Table                     Men   Women  1/2 Average Risk   3.4   3.3  Average Risk       5.0   4.4  2 X Average Risk   9.6   7.1  3 X Average Risk  23.4   11.0        Use the calculated Patient Ratio above and the CHD Risk Table to determine the patient's CHD Risk.        ATP III CLASSIFICATION (LDL):  <100     mg/dL   Optimal  100-129  mg/dL   Near or Above                    Optimal  130-159  mg/dL   Borderline  160-189  mg/dL   High  >190     mg/dL   Very High   . Hgb A1c MFr Bld 12/30/2014 6.6* 4.8 - 5.6 % Final   Comment: (NOTE)         Pre-diabetes: 5.7 - 6.4         Diabetes: >6.4         Glycemic control for adults with diabetes: <7.0   . Mean Plasma Glucose 12/30/2014 143   Final   Comment: (NOTE) Performed At: [facility] [place], [place] [id number] Lindon Romp MD RK:[id number]   . Sodium 12/30/2014 139  135 - 145 mmol/L Final  . Potassium 12/30/2014 3.8  3.5 - 5.1 mmol/L Final  . Chloride 12/30/2014 106  96 - 112 mmol/L Final  . CO2 12/30/2014 24  19 - 32 mmol/L Final  . Glucose, Bld 12/30/2014 92  70 - 99 mg/dL  Final  . BUN 12/30/2014 23  6 -  23 mg/dL Final  . Creatinine, Ser 12/30/2014 0.99  0.50 - 1.10 mg/dL Final  . Calcium 12/30/2014 8.4  8.4 - 10.5 mg/dL Final  . GFR calc non Af Amer 12/30/2014 51* >90 mL/min Final  . GFR calc Af Amer 12/30/2014 59* >90 mL/min Final   Comment: (NOTE) The eGFR has been calculated using the CKD EPI equation. This calculation has not been validated in all clinical situations. eGFR's persistently <90 mL/min signify possible Chronic Kidney Disease.   . Anion gap 12/30/2014 9  5 - 15 Final  . WBC 12/30/2014 3.5* 4.0 - 10.5 K/uL Final  . RBC 12/30/2014 3.67* 3.87 - 5.11 MIL/uL Final  . Hemoglobin 12/30/2014 10.1* 12.0 - 15.0 g/dL Final  . HCT 12/30/2014 31.1* 36.0 - 46.0 % Final  . MCV 12/30/2014 84.7  78.0 - 100.0 fL Final  . MCH 12/30/2014 27.5  26.0 - 34.0 pg Final  . MCHC 12/30/2014 32.5  30.0 - 36.0 g/dL Final  . RDW 12/30/2014 16.5* 11.5 - 15.5 % Final  . Platelets 12/30/2014 164  150 - 400 K/uL Final  . Carbamazepine Lvl 12/30/2014 10.6  4.0 - 12.0 ug/mL Final  . Sodium 12/31/2014 138  135 - 145 mmol/L Final  . Potassium 12/31/2014 4.1  3.5 - 5.1 mmol/L Final  . Chloride 12/31/2014 105  96 - 112 mmol/L Final  . CO2 12/31/2014 25  19 - 32 mmol/L Final  . Glucose, Bld 12/31/2014 87  70 - 99 mg/dL Final  . BUN 12/31/2014 27* 6 - 23 mg/dL Final  . Creatinine, Ser 12/31/2014 1.26* 0.50 - 1.10 mg/dL Final  . Calcium 12/31/2014 8.2* 8.4 - 10.5 mg/dL Final  . GFR calc non Af Amer 12/31/2014 38* >90 mL/min Final  . GFR calc Af Amer 12/31/2014 44* >90 mL/min Final   Comment: (NOTE) The eGFR has been calculated using the CKD EPI equation. This calculation has not been validated in all clinical situations. eGFR's persistently <90 mL/min signify possible Chronic Kidney Disease.   . Anion gap 12/31/2014 8  5 - 15 Final  . WBC 12/31/2014 3.6* 4.0 - 10.5 K/uL Final  . RBC 12/31/2014 3.66* 3.87 - 5.11 MIL/uL Final  . Hemoglobin 12/31/2014 9.9* 12.0 - 15.0 g/dL  Final  . HCT 12/31/2014 30.5* 36.0 - 46.0 % Final  . MCV 12/31/2014 83.3  78.0 - 100.0 fL Final  . MCH 12/31/2014 27.0  26.0 - 34.0 pg Final  . MCHC 12/31/2014 32.5  30.0 - 36.0 g/dL Final  . RDW 12/31/2014 16.7* 11.5 - 15.5 % Final  . Platelets 12/31/2014 152  150 - 400 K/uL Final    Dg Chest 2 View  12/29/2014   CLINICAL DATA:  81 year old female with several week history of shortness of breath  EXAM: CHEST  2 VIEW  COMPARISON:  Prior chest x-ray 12/03/2013  FINDINGS: Stable cardiomegaly. Ectatic and tortuous thoracic aorta. Similar appearance of diffuse thickening of the paratracheal stripes likely reflecting prominent and tortuous great vessels. Slightly increased pulmonary vascular congestion with small volume fluid in the minor fissure. Chronic linear atelectasis versus scarring in the left mid and inferior lung. No pleural effusion or pneumothorax. No acute osseous abnormality.  IMPRESSION: 1. Very similar appearance of the chest compared to prior imaging. There is small volume fluid within the minor fissure on today's examination which is of uncertain clinical significance. No other evidence of pleural effusion. 2. Stable cardiomegaly and mild vascular congestion without evidence of edema. 3. Stable chronic atelectasis versus scarring  in the left mid and lower lung.   Electronically Signed   By: Jacqulynn Cadet M.D.   On: 12/29/2014 22:24   Ct Head Wo Contrast  12/30/2014   CLINICAL DATA:  Acute onset of confusion. Change in breathing over the past week. Altered mental status and worsening weakness. Initial encounter.  EXAM: CT HEAD WITHOUT CONTRAST  TECHNIQUE: Contiguous axial images were obtained from the base of the skull through the vertex without intravenous contrast.  COMPARISON:  CT of the head performed 11/19/2013, and MRI of the brain performed 08/25/2012  FINDINGS: There is no evidence of acute infarction, mass lesion, or intra- or extra-axial hemorrhage on CT.  Prominence of the  ventricles and sulci reflects mild to moderate cortical volume loss. Mild cerebellar atrophy is noted. Diffuse periventricular and subcortical white matter change likely reflects small vessel ischemic microangiopathy. Chronic ischemic change is noted at the basal ganglia and thalami bilaterally.  The brainstem and fourth ventricle are within normal limits. The cerebral hemispheres demonstrate grossly normal gray-white differentiation. No mass effect or midline shift is seen.  There is no evidence of fracture; small bilateral craniectomy defects are noted at the temporal regions. The visualized portions of the orbits are within normal limits. Dense opacification is noted within the right maxillary sinus and right-sided nasal passages, likely reflecting inspissated mucus, with underlying mucoperiosteal thickening. There is similar opacification in 2013, on the prior MRI. Paranasal sinuses and mastoid air cells are well-aerated. No significant soft tissue abnormalities are seen.  IMPRESSION: 1. No acute intracranial pathology seen on CT. 2. Mild to moderate cortical volume loss and diffuse small vessel ischemic microangiopathy. 3. Chronic ischemic change at the basal ganglia and thalami bilaterally. 4. Dense opacification within the right maxillary sinus and right-sided nasal passages, likely reflecting inspissated mucus, with underlying mucoperiosteal thickening. There was a similar appearance on the prior MRI from 2013.   Electronically Signed   By: Garald Balding M.D.   On: 12/30/2014 00:44     Assessment/Plan   ICD-9-CM ICD-10-CM   1. Essential hypertension - stable 401.9 I10   2. Chronic systolic CHF (congestive heart failure) (HCC) - stable 428.22 I50.22    428.0    3. Dementia without behavioral disturbance - stable 294.20 F03.90   4. Paroxysmal a-fib (HCC) - rate controlled 427.31 I48.0   5. History of stroke - stable V12.54 Z86.73   6. PAD (peripheral artery disease) (HCC) - stable 443.9 I73.9     7. Alcohol abuse 305.00 F10.10   8. S/P BKA (below knee amputation) unilateral, right ([place]) V49.75 Z89.511     --Patient is being discharged with home health services:  RN, PT, OT  Patient is being discharged with the following durable medical equipment:  None  Patient has been advised to f/u with their PCP in 1-2 weeks to bring them up to date on their rehab stay.  They were provided with a 30 day supply of scripts for prescription medications and refills must be obtained from their PCP.  TIME SPENT (MINUTES): [place]. Perlie Gold  [facility] [address] [place], [place] [address] [phone] Cell (Monday-Friday 8 AM - 5 PM) [phone] After 5 PM and follow prompts

## 2015-01-22 DIAGNOSIS — N183 Chronic kidney disease, stage 3 (moderate): Secondary | ICD-10-CM | POA: Diagnosis not present

## 2015-01-22 DIAGNOSIS — F0390 Unspecified dementia without behavioral disturbance: Secondary | ICD-10-CM | POA: Diagnosis not present

## 2015-01-22 DIAGNOSIS — S88911S Complete traumatic amputation of right lower leg, level unspecified, sequela: Secondary | ICD-10-CM | POA: Diagnosis not present

## 2015-01-22 DIAGNOSIS — I129 Hypertensive chronic kidney disease with stage 1 through stage 4 chronic kidney disease, or unspecified chronic kidney disease: Secondary | ICD-10-CM | POA: Diagnosis not present

## 2015-01-22 DIAGNOSIS — I4891 Unspecified atrial fibrillation: Secondary | ICD-10-CM | POA: Diagnosis not present

## 2015-01-24 DIAGNOSIS — I129 Hypertensive chronic kidney disease with stage 1 through stage 4 chronic kidney disease, or unspecified chronic kidney disease: Secondary | ICD-10-CM | POA: Diagnosis not present

## 2015-01-24 DIAGNOSIS — I6789 Other cerebrovascular disease: Secondary | ICD-10-CM | POA: Diagnosis not present

## 2015-01-24 DIAGNOSIS — N183 Chronic kidney disease, stage 3 (moderate): Secondary | ICD-10-CM | POA: Diagnosis not present

## 2015-01-24 DIAGNOSIS — F0390 Unspecified dementia without behavioral disturbance: Secondary | ICD-10-CM | POA: Diagnosis not present

## 2015-01-24 DIAGNOSIS — R269 Unspecified abnormalities of gait and mobility: Secondary | ICD-10-CM | POA: Diagnosis not present

## 2015-01-24 DIAGNOSIS — S88911S Complete traumatic amputation of right lower leg, level unspecified, sequela: Secondary | ICD-10-CM | POA: Diagnosis not present

## 2015-01-24 DIAGNOSIS — I4891 Unspecified atrial fibrillation: Secondary | ICD-10-CM | POA: Diagnosis not present

## 2015-01-28 ENCOUNTER — Ambulatory Visit: Admitting: Family Medicine

## 2015-01-28 DIAGNOSIS — S88911S Complete traumatic amputation of right lower leg, level unspecified, sequela: Secondary | ICD-10-CM | POA: Diagnosis not present

## 2015-01-28 DIAGNOSIS — F0390 Unspecified dementia without behavioral disturbance: Secondary | ICD-10-CM | POA: Diagnosis not present

## 2015-01-28 DIAGNOSIS — I4891 Unspecified atrial fibrillation: Secondary | ICD-10-CM | POA: Diagnosis not present

## 2015-01-28 DIAGNOSIS — I129 Hypertensive chronic kidney disease with stage 1 through stage 4 chronic kidney disease, or unspecified chronic kidney disease: Secondary | ICD-10-CM | POA: Diagnosis not present

## 2015-01-28 DIAGNOSIS — N183 Chronic kidney disease, stage 3 (moderate): Secondary | ICD-10-CM | POA: Diagnosis not present

## 2015-01-29 DIAGNOSIS — I129 Hypertensive chronic kidney disease with stage 1 through stage 4 chronic kidney disease, or unspecified chronic kidney disease: Secondary | ICD-10-CM | POA: Diagnosis not present

## 2015-01-29 DIAGNOSIS — I4891 Unspecified atrial fibrillation: Secondary | ICD-10-CM | POA: Diagnosis not present

## 2015-01-29 DIAGNOSIS — N183 Chronic kidney disease, stage 3 (moderate): Secondary | ICD-10-CM | POA: Diagnosis not present

## 2015-01-29 DIAGNOSIS — F0390 Unspecified dementia without behavioral disturbance: Secondary | ICD-10-CM | POA: Diagnosis not present

## 2015-01-29 DIAGNOSIS — S88911S Complete traumatic amputation of right lower leg, level unspecified, sequela: Secondary | ICD-10-CM | POA: Diagnosis not present

## 2015-01-30 DIAGNOSIS — N183 Chronic kidney disease, stage 3 (moderate): Secondary | ICD-10-CM | POA: Diagnosis not present

## 2015-01-30 DIAGNOSIS — I129 Hypertensive chronic kidney disease with stage 1 through stage 4 chronic kidney disease, or unspecified chronic kidney disease: Secondary | ICD-10-CM | POA: Diagnosis not present

## 2015-01-30 DIAGNOSIS — S88911S Complete traumatic amputation of right lower leg, level unspecified, sequela: Secondary | ICD-10-CM | POA: Diagnosis not present

## 2015-01-30 DIAGNOSIS — I4891 Unspecified atrial fibrillation: Secondary | ICD-10-CM | POA: Diagnosis not present

## 2015-01-30 DIAGNOSIS — F0390 Unspecified dementia without behavioral disturbance: Secondary | ICD-10-CM | POA: Diagnosis not present

## 2015-02-04 ENCOUNTER — Telehealth: Payer: Self-pay | Admitting: *Deleted

## 2015-02-04 DIAGNOSIS — I129 Hypertensive chronic kidney disease with stage 1 through stage 4 chronic kidney disease, or unspecified chronic kidney disease: Secondary | ICD-10-CM | POA: Diagnosis not present

## 2015-02-04 DIAGNOSIS — I4891 Unspecified atrial fibrillation: Secondary | ICD-10-CM | POA: Diagnosis not present

## 2015-02-04 DIAGNOSIS — F0390 Unspecified dementia without behavioral disturbance: Secondary | ICD-10-CM | POA: Diagnosis not present

## 2015-02-04 DIAGNOSIS — S88911S Complete traumatic amputation of right lower leg, level unspecified, sequela: Secondary | ICD-10-CM | POA: Diagnosis not present

## 2015-02-04 DIAGNOSIS — N183 Chronic kidney disease, stage 3 (moderate): Secondary | ICD-10-CM | POA: Diagnosis not present

## 2015-02-04 NOTE — Telephone Encounter (Signed)
Trey Paula from [facility] called and requesting the following verbal orders (pt is at home now, not a SNF)  1. Skilled nursing - 2X week for 5 weeks and then 1X week for 4 weeks 2. Speech therapy 3. Med management 4. [facility] cardiopulmonary program - will provide BP cuff and pulse oximeter 5. PT - 3X week for 2 weeks then 2X week for 2 weeks 6. OT - 2X week for 3 weeks then 1X week for 1 week  Trey Paula says you can LM with verbal orders. Esaiah Wanless, Maryjo [place]

## 2015-02-05 DIAGNOSIS — S88911S Complete traumatic amputation of right lower leg, level unspecified, sequela: Secondary | ICD-10-CM | POA: Diagnosis not present

## 2015-02-05 DIAGNOSIS — N183 Chronic kidney disease, stage 3 (moderate): Secondary | ICD-10-CM | POA: Diagnosis not present

## 2015-02-05 DIAGNOSIS — I129 Hypertensive chronic kidney disease with stage 1 through stage 4 chronic kidney disease, or unspecified chronic kidney disease: Secondary | ICD-10-CM | POA: Diagnosis not present

## 2015-02-05 DIAGNOSIS — F0390 Unspecified dementia without behavioral disturbance: Secondary | ICD-10-CM | POA: Diagnosis not present

## 2015-02-05 DIAGNOSIS — I4891 Unspecified atrial fibrillation: Secondary | ICD-10-CM | POA: Diagnosis not present

## 2015-02-05 NOTE — Telephone Encounter (Signed)
Spoke with Trey Paula orders approved.

## 2015-02-05 NOTE — Telephone Encounter (Signed)
Wes with [facility] called to get verbal orders for PT.  Need order for 3 times weekly for 2 wks and then 2 times weekly for 2 weeks.   Please contact both Trey Paula and Wes for these orders. You can reach Wes at [phone]

## 2015-02-06 DIAGNOSIS — I4891 Unspecified atrial fibrillation: Secondary | ICD-10-CM | POA: Diagnosis not present

## 2015-02-06 DIAGNOSIS — S88911S Complete traumatic amputation of right lower leg, level unspecified, sequela: Secondary | ICD-10-CM | POA: Diagnosis not present

## 2015-02-06 DIAGNOSIS — N183 Chronic kidney disease, stage 3 (moderate): Secondary | ICD-10-CM | POA: Diagnosis not present

## 2015-02-06 DIAGNOSIS — I129 Hypertensive chronic kidney disease with stage 1 through stage 4 chronic kidney disease, or unspecified chronic kidney disease: Secondary | ICD-10-CM | POA: Diagnosis not present

## 2015-02-06 DIAGNOSIS — F0390 Unspecified dementia without behavioral disturbance: Secondary | ICD-10-CM | POA: Diagnosis not present

## 2015-02-07 DIAGNOSIS — N183 Chronic kidney disease, stage 3 (moderate): Secondary | ICD-10-CM | POA: Diagnosis not present

## 2015-02-07 DIAGNOSIS — F0390 Unspecified dementia without behavioral disturbance: Secondary | ICD-10-CM | POA: Diagnosis not present

## 2015-02-07 DIAGNOSIS — S88911S Complete traumatic amputation of right lower leg, level unspecified, sequela: Secondary | ICD-10-CM | POA: Diagnosis not present

## 2015-02-07 DIAGNOSIS — I129 Hypertensive chronic kidney disease with stage 1 through stage 4 chronic kidney disease, or unspecified chronic kidney disease: Secondary | ICD-10-CM | POA: Diagnosis not present

## 2015-02-07 DIAGNOSIS — I4891 Unspecified atrial fibrillation: Secondary | ICD-10-CM | POA: Diagnosis not present

## 2015-02-07 NOTE — Telephone Encounter (Signed)
Completed.

## 2015-02-10 ENCOUNTER — Telehealth: Payer: Self-pay | Admitting: Obstetrics and Gynecology

## 2015-02-10 DIAGNOSIS — I4891 Unspecified atrial fibrillation: Secondary | ICD-10-CM | POA: Diagnosis not present

## 2015-02-10 DIAGNOSIS — I129 Hypertensive chronic kidney disease with stage 1 through stage 4 chronic kidney disease, or unspecified chronic kidney disease: Secondary | ICD-10-CM | POA: Diagnosis not present

## 2015-02-10 DIAGNOSIS — S88911S Complete traumatic amputation of right lower leg, level unspecified, sequela: Secondary | ICD-10-CM | POA: Diagnosis not present

## 2015-02-10 DIAGNOSIS — N183 Chronic kidney disease, stage 3 (moderate): Secondary | ICD-10-CM | POA: Diagnosis not present

## 2015-02-10 DIAGNOSIS — F0390 Unspecified dementia without behavioral disturbance: Secondary | ICD-10-CM | POA: Diagnosis not present

## 2015-02-10 NOTE — Telephone Encounter (Signed)
Will forward to RN team to see if they have received authorization for this patient. Merritt Mccravy,CMA

## 2015-02-10 NOTE — Telephone Encounter (Signed)
xarleto needs prior authorizaton Pt is completely out of her medication  Please advise

## 2015-02-11 ENCOUNTER — Telehealth: Payer: Self-pay | Admitting: Obstetrics and Gynecology

## 2015-02-11 DIAGNOSIS — F0390 Unspecified dementia without behavioral disturbance: Secondary | ICD-10-CM | POA: Diagnosis not present

## 2015-02-11 DIAGNOSIS — I129 Hypertensive chronic kidney disease with stage 1 through stage 4 chronic kidney disease, or unspecified chronic kidney disease: Secondary | ICD-10-CM | POA: Diagnosis not present

## 2015-02-11 DIAGNOSIS — N183 Chronic kidney disease, stage 3 (moderate): Secondary | ICD-10-CM | POA: Diagnosis not present

## 2015-02-11 DIAGNOSIS — I4891 Unspecified atrial fibrillation: Secondary | ICD-10-CM | POA: Diagnosis not present

## 2015-02-11 DIAGNOSIS — S88911S Complete traumatic amputation of right lower leg, level unspecified, sequela: Secondary | ICD-10-CM | POA: Diagnosis not present

## 2015-02-11 NOTE — Telephone Encounter (Signed)
Will forward to RN team to see if this was ever received from pharmacy. Jazmin Hartsell,CMA

## 2015-02-11 NOTE — Telephone Encounter (Signed)
Patient's daughter is calling because they need a "PA" (prior authorization?) on the rx, Xarelto, before it can be filled. Please notify the patient once this is complete. Thank you, Dorothey Baseman, ASA

## 2015-02-12 DIAGNOSIS — I4891 Unspecified atrial fibrillation: Secondary | ICD-10-CM | POA: Diagnosis not present

## 2015-02-12 DIAGNOSIS — F0390 Unspecified dementia without behavioral disturbance: Secondary | ICD-10-CM | POA: Diagnosis not present

## 2015-02-12 DIAGNOSIS — S88911S Complete traumatic amputation of right lower leg, level unspecified, sequela: Secondary | ICD-10-CM | POA: Diagnosis not present

## 2015-02-12 DIAGNOSIS — I129 Hypertensive chronic kidney disease with stage 1 through stage 4 chronic kidney disease, or unspecified chronic kidney disease: Secondary | ICD-10-CM | POA: Diagnosis not present

## 2015-02-12 DIAGNOSIS — N183 Chronic kidney disease, stage 3 (moderate): Secondary | ICD-10-CM | POA: Diagnosis not present

## 2015-02-13 DIAGNOSIS — S88911S Complete traumatic amputation of right lower leg, level unspecified, sequela: Secondary | ICD-10-CM | POA: Diagnosis not present

## 2015-02-13 DIAGNOSIS — F0390 Unspecified dementia without behavioral disturbance: Secondary | ICD-10-CM | POA: Diagnosis not present

## 2015-02-13 DIAGNOSIS — I4891 Unspecified atrial fibrillation: Secondary | ICD-10-CM | POA: Diagnosis not present

## 2015-02-13 DIAGNOSIS — N183 Chronic kidney disease, stage 3 (moderate): Secondary | ICD-10-CM | POA: Diagnosis not present

## 2015-02-13 DIAGNOSIS — I129 Hypertensive chronic kidney disease with stage 1 through stage 4 chronic kidney disease, or unspecified chronic kidney disease: Secondary | ICD-10-CM | POA: Diagnosis not present

## 2015-02-14 DIAGNOSIS — I4891 Unspecified atrial fibrillation: Secondary | ICD-10-CM | POA: Diagnosis not present

## 2015-02-14 DIAGNOSIS — S88911S Complete traumatic amputation of right lower leg, level unspecified, sequela: Secondary | ICD-10-CM | POA: Diagnosis not present

## 2015-02-14 DIAGNOSIS — I129 Hypertensive chronic kidney disease with stage 1 through stage 4 chronic kidney disease, or unspecified chronic kidney disease: Secondary | ICD-10-CM | POA: Diagnosis not present

## 2015-02-14 DIAGNOSIS — N183 Chronic kidney disease, stage 3 (moderate): Secondary | ICD-10-CM | POA: Diagnosis not present

## 2015-02-14 DIAGNOSIS — F0390 Unspecified dementia without behavioral disturbance: Secondary | ICD-10-CM | POA: Diagnosis not present

## 2015-02-17 NOTE — Telephone Encounter (Signed)
PA form placed in provider box for completion.  Ephraim Reichel L, RN  

## 2015-02-17 NOTE — Telephone Encounter (Signed)
Daughter is calling back to check the status of the prior authorization for her mother's Xarelto. jw

## 2015-02-17 NOTE — Telephone Encounter (Signed)
Will forward to RN team and provider to see if this was ever received. Jacquelinne Speak,CMA

## 2015-02-18 DIAGNOSIS — S88911S Complete traumatic amputation of right lower leg, level unspecified, sequela: Secondary | ICD-10-CM | POA: Diagnosis not present

## 2015-02-18 DIAGNOSIS — I4891 Unspecified atrial fibrillation: Secondary | ICD-10-CM | POA: Diagnosis not present

## 2015-02-18 DIAGNOSIS — F0390 Unspecified dementia without behavioral disturbance: Secondary | ICD-10-CM | POA: Diagnosis not present

## 2015-02-18 DIAGNOSIS — N183 Chronic kidney disease, stage 3 (moderate): Secondary | ICD-10-CM | POA: Diagnosis not present

## 2015-02-18 DIAGNOSIS — I129 Hypertensive chronic kidney disease with stage 1 through stage 4 chronic kidney disease, or unspecified chronic kidney disease: Secondary | ICD-10-CM | POA: Diagnosis not present

## 2015-02-18 NOTE — Telephone Encounter (Signed)
Thanks for completing this for me. I will be in the office tmrw during lunch conference to fill out any forms.

## 2015-02-18 NOTE — Telephone Encounter (Signed)
PA completed and faxed to [facility] for review.  Clovis Pu, RN

## 2015-02-19 ENCOUNTER — Other Ambulatory Visit: Payer: Self-pay | Admitting: *Deleted

## 2015-02-19 DIAGNOSIS — I129 Hypertensive chronic kidney disease with stage 1 through stage 4 chronic kidney disease, or unspecified chronic kidney disease: Secondary | ICD-10-CM | POA: Diagnosis not present

## 2015-02-19 DIAGNOSIS — N183 Chronic kidney disease, stage 3 (moderate): Secondary | ICD-10-CM | POA: Diagnosis not present

## 2015-02-19 DIAGNOSIS — I4891 Unspecified atrial fibrillation: Secondary | ICD-10-CM | POA: Diagnosis not present

## 2015-02-19 DIAGNOSIS — S88911S Complete traumatic amputation of right lower leg, level unspecified, sequela: Secondary | ICD-10-CM | POA: Diagnosis not present

## 2015-02-19 DIAGNOSIS — F0390 Unspecified dementia without behavioral disturbance: Secondary | ICD-10-CM | POA: Diagnosis not present

## 2015-02-19 NOTE — Telephone Encounter (Signed)
PA approved for Xarelto from [facility] until 02/18/16.  Clovis Pu, RN

## 2015-02-21 DIAGNOSIS — I129 Hypertensive chronic kidney disease with stage 1 through stage 4 chronic kidney disease, or unspecified chronic kidney disease: Secondary | ICD-10-CM | POA: Diagnosis not present

## 2015-02-21 DIAGNOSIS — I4891 Unspecified atrial fibrillation: Secondary | ICD-10-CM | POA: Diagnosis not present

## 2015-02-21 DIAGNOSIS — N183 Chronic kidney disease, stage 3 (moderate): Secondary | ICD-10-CM | POA: Diagnosis not present

## 2015-02-21 DIAGNOSIS — F0390 Unspecified dementia without behavioral disturbance: Secondary | ICD-10-CM | POA: Diagnosis not present

## 2015-02-21 DIAGNOSIS — S88911S Complete traumatic amputation of right lower leg, level unspecified, sequela: Secondary | ICD-10-CM | POA: Diagnosis not present

## 2015-02-24 ENCOUNTER — Telehealth: Payer: Self-pay | Admitting: *Deleted

## 2015-02-24 DIAGNOSIS — I129 Hypertensive chronic kidney disease with stage 1 through stage 4 chronic kidney disease, or unspecified chronic kidney disease: Secondary | ICD-10-CM | POA: Diagnosis not present

## 2015-02-24 DIAGNOSIS — I4891 Unspecified atrial fibrillation: Secondary | ICD-10-CM | POA: Diagnosis not present

## 2015-02-24 DIAGNOSIS — I6789 Other cerebrovascular disease: Secondary | ICD-10-CM | POA: Diagnosis not present

## 2015-02-24 DIAGNOSIS — S88911S Complete traumatic amputation of right lower leg, level unspecified, sequela: Secondary | ICD-10-CM | POA: Diagnosis not present

## 2015-02-24 DIAGNOSIS — R269 Unspecified abnormalities of gait and mobility: Secondary | ICD-10-CM | POA: Diagnosis not present

## 2015-02-24 DIAGNOSIS — N183 Chronic kidney disease, stage 3 (moderate): Secondary | ICD-10-CM | POA: Diagnosis not present

## 2015-02-24 DIAGNOSIS — F0390 Unspecified dementia without behavioral disturbance: Secondary | ICD-10-CM | POA: Diagnosis not present

## 2015-02-24 NOTE — Telephone Encounter (Signed)
Received refill request for carbamezaprine.  Not on pts list, will forward to MD. Milas Gain, Maryjo [place]

## 2015-02-24 NOTE — Telephone Encounter (Signed)
Patient hospitalized back in April. Carbamezapine was discontinued at that time so should not be taking anymore. I have not seen patient in several months. Can you see if we can schedule her to come in so I can follow-up on her medications? Thanks!

## 2015-03-04 NOTE — Progress Notes (Signed)
Patient ID: Ann [name], female   DOB: 28-Sep-1927, 81 y.o.   MRN: [id number]  [facility] Gemma living [place]      Allergies  Allergen Reactions  . Lisinopril Other (See Comments)    Possible Angioedema on 11/16/13 admission.       Chief Complaint  Patient presents with  . Hospitalization Follow-up    HPI:  She has been hospitalized due to uncontrolled hypertension; her dementia and chronic renal disease. She is here for short term rehab at this time. She is unable to fully participate in the hpi or ros. There are no nursing concerns at this time.    Past Medical History  Diagnosis Date  . Hypertension   . Dementia   . Hyperlipidemia   . Seizures   . Metatarsal bone fracture     Right - base of 5th  . DVT (deep venous thrombosis) 02/22/2013    02/18/2013: Left lower extremity (femoral and popliteal) subacute Treated with coumadin x 6-7 months    . Stroke   . Trigeminal neuralgia 11/03/2006    Qualifier: Diagnosis of  By: Eusebio Friendly    . CATARACT 11/03/2006    Qualifier: Diagnosis of  By: Eusebio Friendly    . HEARING LOSS NOS OR DEAFNESS 11/03/2006    Qualifier: Diagnosis of  By: Eusebio Friendly    . INCONTINENCE, URGE 11/03/2006    Qualifier: Diagnosis of  By: Eusebio Friendly    . Gangrene   . Atrial fibrillation   . Shortness of breath dyspnea     Past Surgical History  Procedure Laterality Date  . Toe surgery      right 5th toe amputation  . Amputation Right 10/26/2013    Procedure: AMPUTATION BELOW KNEE;  Surgeon: Newt Minion, MD;  Location: [facility];  Service: Orthopedics;  Laterality: Right;  Right Below Knee Amputation  . Abdominal aortagram N/A 09/13/2013    Procedure: ABDOMINAL Maxcine Ham;  Surgeon: Conrad Sublette, MD;  Location: [facility] CATH LAB;  Service: Cardiovascular;  Laterality: N/A;  . Below knee leg amputation Right 10/2013    VITAL SIGNS BP 93/67 mmHg  Pulse 85  Ht $R'5\' 1"'Fc$  (1.549 m)  Wt 160 lb (72.576 kg)  BMI 30.25 kg/m2   Outpatient Encounter  Prescriptions as of 01/03/2015  Medication Sig  . amiodarone (PACERONE) 200 MG tablet TAKE 1 TABLET BY MOUTH TWICE DAILY  . amLODipine (NORVASC) 10 MG tablet Take 1 tablet (10 mg total) by mouth daily.  . carvedilol (COREG) 3.125 MG tablet TAKE 1 TABLET BY MOUTH TWICE DAILY WITH A MEAL  . memantine (NAMENDA) 10 MG tablet Take 1 tablet (10 mg total) by mouth 2 (two) times daily.  . polyethylene glycol (MIRALAX / GLYCOLAX) packet Take 17 g by mouth daily as needed for moderate constipation.  Alveda Reasons 15 MG TABS tablet TAKE 1 TABLET BY MOUTH DAILY      SIGNIFICANT DIAGNOSTIC EXAMS  12-29-14: chest x-ray: 1. Very similar appearance of the chest compared to prior imaging. There is small volume fluid within the minor fissure on today's examination which is of uncertain clinical significance. No other evidence of pleural effusion. 2. Stable cardiomegaly and mild vascular congestion without evidence of edema. 3. Stable chronic atelectasis versus scarring in the left mid and lower lung.  12-30-14: 2-d echo:  - Left ventricle: The cavity size was normal. Wall thickness was increased in a pattern of moderate LVH. Systolic function was normal. The estimated ejection fraction was in the range of 50%  to 55%. Wall motion was normal; there were no regional wall motion abnormalities. Doppler parameters are consistent with restrictive physiology, indicative of decreased left ventricular diastolic compliance and/or increased left atrial pressure. Doppler parameters are consistent with high ventricular filling pressure. - Aortic valve: Valve mobility was mildly restricted. There was trivial regurgitation. - Left atrium: The atrium was moderately dilated. - Right ventricle: Systolic function was mildly reduced. - Right atrium: The atrium was moderately dilated. - Tricuspid valve: There was mild-moderate regurgitation. - Pulmonary arteries: Systolic pressure was mildly increased.   12-30-14: ct of head: 1. No  acute intracranial pathology seen on CT. 2. Mild to moderate cortical volume loss and diffuse small vessel ischemic microangiopathy. 3. Chronic ischemic change at the basal ganglia and thalami bilaterally. 4. Dense opacification within the right maxillary sinus and right-sided nasal passages, likely reflecting inspissated mucus, with underlying mucoperiosteal thickening. There was a similar appearance on the prior MRI from 2013.    LABS REVIEWED:    12-29-14; wbc 4.5; hgb 10.9; hct 33.9; mcv 84.3; plt 172; glucose 81; bun 26; creat 1.17; k+3.8; na++138; alk phos 122; albumin 3.2; BNP 614.3  12-30-14: vit b 12: 165; RPR: neg: HIV: neg; tsh 2.809; hgb a1c 6.6; chol 152; ldl 87; rig 34; hdl 58; tegretol 10.6; urine culture: no growth     Review of Systems  Unable to perform ROS    Physical Exam  Constitutional: She appears well-developed and well-nourished. No distress.  Neck: Neck supple. No JVD present. No thyromegaly present.  Cardiovascular: Normal rate, regular rhythm and intact distal pulses.   +pp on left   Respiratory: Effort normal and breath sounds normal. No respiratory distress.  GI: Soft. Bowel sounds are normal. She exhibits no distension. There is no tenderness.  Musculoskeletal: She exhibits edema.  Is status post right bka Is able to move extremities 2+ left lower extremity edema   Neurological: She is alert.  Skin: Skin is warm and dry. She is not diaphoretic.       ASSESSMENT/ PLAN:  1. Hypertension: is stable will continue norvasc 10 mg daily; coreg 3.125 mg twice daily will have nursing check blood pressure every shift.  2. Afib: heart rate is stable will continue coreg 3.125 mg twice daily and amiodarone 200 mg twice daily for rate control and will conitnue xarelto 15 mg daily will monitor   3. Dementia: is without change will continue namenda 10 mg twice daily will monitor  4. CVA: is neurologically stable will continue xarelto 15 mg daily   5.  Constipation: will continue miralax daily as needed   Time spent with patient 50 minutes.     Ok Edwards NP [facility]  Contact [phone] Monday through Friday 8am- 5pm  After hours call [phone]

## 2015-03-05 DIAGNOSIS — F0390 Unspecified dementia without behavioral disturbance: Secondary | ICD-10-CM | POA: Insufficient documentation

## 2015-03-06 DIAGNOSIS — F0390 Unspecified dementia without behavioral disturbance: Secondary | ICD-10-CM | POA: Diagnosis not present

## 2015-03-06 DIAGNOSIS — I4891 Unspecified atrial fibrillation: Secondary | ICD-10-CM | POA: Diagnosis not present

## 2015-03-06 DIAGNOSIS — I129 Hypertensive chronic kidney disease with stage 1 through stage 4 chronic kidney disease, or unspecified chronic kidney disease: Secondary | ICD-10-CM | POA: Diagnosis not present

## 2015-03-06 DIAGNOSIS — N183 Chronic kidney disease, stage 3 (moderate): Secondary | ICD-10-CM | POA: Diagnosis not present

## 2015-03-06 DIAGNOSIS — S88911S Complete traumatic amputation of right lower leg, level unspecified, sequela: Secondary | ICD-10-CM | POA: Diagnosis not present

## 2015-03-12 DIAGNOSIS — F0390 Unspecified dementia without behavioral disturbance: Secondary | ICD-10-CM | POA: Diagnosis not present

## 2015-03-12 DIAGNOSIS — I129 Hypertensive chronic kidney disease with stage 1 through stage 4 chronic kidney disease, or unspecified chronic kidney disease: Secondary | ICD-10-CM | POA: Diagnosis not present

## 2015-03-12 DIAGNOSIS — I4891 Unspecified atrial fibrillation: Secondary | ICD-10-CM | POA: Diagnosis not present

## 2015-03-12 DIAGNOSIS — N183 Chronic kidney disease, stage 3 (moderate): Secondary | ICD-10-CM | POA: Diagnosis not present

## 2015-03-12 DIAGNOSIS — S88911S Complete traumatic amputation of right lower leg, level unspecified, sequela: Secondary | ICD-10-CM | POA: Diagnosis not present

## 2015-03-19 ENCOUNTER — Encounter: Payer: Self-pay | Admitting: Family Medicine

## 2015-03-19 ENCOUNTER — Ambulatory Visit (INDEPENDENT_AMBULATORY_CARE_PROVIDER_SITE_OTHER): Payer: Medicare Other | Admitting: Family Medicine

## 2015-03-19 VITALS — BP 140/82 | HR 74 | Temp 98.2°F | Wt 163.0 lb

## 2015-03-19 DIAGNOSIS — R569 Unspecified convulsions: Secondary | ICD-10-CM

## 2015-03-19 DIAGNOSIS — R21 Rash and other nonspecific skin eruption: Secondary | ICD-10-CM

## 2015-03-19 DIAGNOSIS — I5022 Chronic systolic (congestive) heart failure: Secondary | ICD-10-CM | POA: Diagnosis not present

## 2015-03-19 DIAGNOSIS — I4891 Unspecified atrial fibrillation: Secondary | ICD-10-CM | POA: Diagnosis not present

## 2015-03-19 MED ORDER — TRIAMCINOLONE ACETONIDE 0.1 % EX CREA: 1.0000 "application " | TOPICAL_CREAM | Freq: Two times a day (BID) | CUTANEOUS | Status: DC

## 2015-03-19 NOTE — Progress Notes (Signed)
   Subjective:    Patient ID: Ann [name], female    DOB: 1928-06-12, 81 y.o.   MRN: [id number]  Seen for Same day visit for   CC: rash  RASH  Had rash for 3 weeks Location: Right side and arm with a few spots on her back, left side and left leg Medications tried: Hydrocortisone cream Similar rash in past: No New medications or antibiotics: No; has been on amiodarone for several years; carbamazepine was discontinued at last hospitalization Tick, Insect or new pet exposure: Possible - has been sitting outside a lot and daughter recently treated her dog for ticks Recent travel: No New detergent or soap: No Immunocompromised: No  Symptoms Itching: yes Pain over rash: yes - but also endorses pain without the rash Feeling ill all over: No Fever: No Mouth sores: no Face or tongue swelling: no Trouble breathing: no Joint swelling or pain: no  Review of Symptoms - see HPI PMH - Smoking status noted.    Review of Systems   See HPI for ROS. Objective:  BP 140/82 mmHg  Pulse 74  Temp(Src) 98.2 F (36.8 C) (Oral)  Wt 163 lb (73.936 kg)  General: NAD Skin: warm and dry, diffuse nonblanching petechiae mostly on right side but a few spots also noted on lower back, left side, and left leg     Assessment & Plan:  See Problem List Documentation

## 2015-03-19 NOTE — Patient Instructions (Signed)
It was great seeing you today.   1. Apply triamcinolone cream to rash twice a day for 1 week. Avoid mosquito and bug bites as possible.  2. Return to clinic if rash not improving after 1 week, or if she develops fevers, confusion, or new / worsening symptoms.   Next Appointment  Please make an appointment with Dr Doroteo Glassman to discuss Carbamezapine    If you have any questions or concerns before then, please call the clinic at [phone].  Take Care,   Dr Wenda Low

## 2015-03-19 NOTE — Assessment & Plan Note (Signed)
Daughter questioned why carbamazepine was discontinued at last hospitalization.  Unable to find reasons after reviewing medical records.  Tegretol was listed as continued on progress notes, but was discontinued on discharge note without noting reason.  Advised daughter to follow-up with PCP

## 2015-03-19 NOTE — Assessment & Plan Note (Signed)
Nonblanching petechial rash for 3 weeks.  Patient reports rash is painful and itching but has dementia and endorses pain and itching at other sites , not affected by rash.  Etiology unknown, possibly related to outdoor exposure and insect bites - Triamcinolone cream 0.1% for 7 days - Advised daughter, she should call if rash not improved after one week and could consider dermatology referral See AVS for return precautions

## 2015-03-20 ENCOUNTER — Telehealth: Payer: Self-pay | Admitting: Obstetrics and Gynecology

## 2015-03-20 DIAGNOSIS — I739 Peripheral vascular disease, unspecified: Secondary | ICD-10-CM | POA: Insufficient documentation

## 2015-03-20 DIAGNOSIS — I480 Paroxysmal atrial fibrillation: Secondary | ICD-10-CM | POA: Insufficient documentation

## 2015-03-20 DIAGNOSIS — I10 Essential (primary) hypertension: Secondary | ICD-10-CM | POA: Insufficient documentation

## 2015-03-20 NOTE — Telephone Encounter (Signed)
Patient's daughter is calling because since the patient has been off of her "seizure medication" she has been very aggravating. She would like for something to be Rx'd for "aggravation" in reference to her dementia. Please contact the patient at the earliest convenience. Thank you, Dorothey Baseman, ASA

## 2015-03-21 NOTE — Telephone Encounter (Signed)
Called and LVM that patient needs to make an appointment to be seen in order to make sure her medications are appropriate and for general check-up.

## 2015-03-24 ENCOUNTER — Ambulatory Visit (INDEPENDENT_AMBULATORY_CARE_PROVIDER_SITE_OTHER): Payer: Medicare Other | Admitting: Family Medicine

## 2015-03-24 VITALS — BP 133/83 | HR 75 | Temp 98.9°F | Wt 163.0 lb

## 2015-03-24 DIAGNOSIS — R569 Unspecified convulsions: Secondary | ICD-10-CM

## 2015-03-24 MED ORDER — LEVETIRACETAM 500 MG PO TABS: 500.0000 mg | ORAL_TABLET | Freq: Two times a day (BID) | ORAL | Status: DC

## 2015-03-24 NOTE — Patient Instructions (Addendum)
Thanks for coming in today.   We will start Keppra 500mg  you will take it twice daily. Follow up with neurology when you get your appointment.   Follow up with Dr. Doroteo Glassman as soon as you are able.   Thanks for letting us take care of you.   Sincerely,  Devota Pace, MD Family Medicine - PGY 2

## 2015-03-26 NOTE — Progress Notes (Signed)
Patient ID: Ann [name], female   DOB: 1927/11/23, 81 y.o.   MRN: [id number]   [facility] Yolande Jolly, MD Phone: [phone]  Subjective:   # Seizures - Pt. Is here with her daughter who is her primary caregiver.  - Pt. Is mostly nonverbal due to end stage dementia.  - She was hospitalized in April of this year, and at that time was placed on Xarelto.  - She was previously on Carbamazepine for partial seizures / convulsions but this was discontinued during her hospitalization for an unclear reason, but likely because of drug interactions with xarelto.  - Since that time, the daughter says that she has been having more frequent seizure activity / convulsions at home. She says she did not have this before when she was on the carbemazepine.  - She has been managed on this as an outpatient for years, but has not seen neurology for follow up in 3 years.  - She says that the convulsions consist of staring, generalized shaking / tremors / convulsions. No loss of bowel / bladder continence. She is unresponsive during these episodes. They are very brief.  - Daughter would like her to go back on the carbemazepine, but understands that there may be an interaction with xarelto.    All relevant systems were reviewed and were negative unless otherwise noted in the HPI  Past Medical History Reviewed problem list.  Medications- reviewed and updated Current Outpatient Prescriptions  Medication Sig Dispense Refill  . amiodarone (PACERONE) 200 MG tablet TAKE 1 TABLET BY MOUTH TWICE DAILY 60 tablet 6  . amLODipine (NORVASC) 10 MG tablet Take 1 tablet (10 mg total) by mouth daily. 30 tablet 2  . carvedilol (COREG) 3.125 MG tablet TAKE 1 TABLET BY MOUTH TWICE DAILY WITH A MEAL 60 tablet 5  . levETIRAcetam (KEPPRA) 500 MG tablet Take 1 tablet (500 mg total) by mouth 2 (two) times daily. 60 tablet 0  . memantine (NAMENDA) 10 MG tablet Take 1 tablet (10 mg total) by mouth 2 (two)  times daily. 180 tablet 1  . polyethylene glycol (MIRALAX / GLYCOLAX) packet Take 17 g by mouth daily as needed for moderate constipation.    . triamcinolone cream (KENALOG) 0.1 % Apply 1 application topically 2 (two) times daily. 30 g 0  . XARELTO 15 MG TABS tablet TAKE 1 TABLET BY MOUTH DAILY 30 tablet 6   No current facility-administered medications for this visit.   Chief complaint-noted No additions to family history Social history- patient is a non smoker  Objective: BP 133/83 mmHg  Pulse 75  Temp(Src) 98.9 F (37.2 C) (Oral)  Wt 163 lb (73.936 kg) Gen: NAD, alert, cooperative with exam, nonverbal / has difficulty expressing herself. In wheel chair due to amputation.  HEENT: NCAT, EOMI, PERRL Neck: FROM, supple CV: RRR, good S1/S2, no murmur Resp: CTABL, no wheezes, non-labored Abd: SNTND, BS present, no guarding or organomegaly Ext: No edema, warm, normal tone, moves UE/LE spontaneously. LLE BKA.  Neuro: Alert and not oriented, Unable to communicate clearly. This is a cognitive deficit, otherwise her neuro exam is normal.  Skin: no rashes no lesions  Assessment/Plan:  # Seizures / Convulsions  - pt. With ongoing convulsions after discontinuing carbemazepine. Concerns include induction of CYP3A4 and reduction of efficacy in Xarelto. This could lead to DVT / PE. She is on Xarelto because of previous DVT. However, given continuation of these seizure like episodes, she should be on some anti-convulsant and  should also be seen by neurology.  - given the benign nature of the drug, may just start keppra for now with plan for referral to neurology for further management of her convulsion disorder.  - Hold off on carbemazepine due to interaction.  - F/U with PCP for further management and discussion around this medication and the plan going forward.

## 2015-04-09 ENCOUNTER — Ambulatory Visit: Payer: Medicare Other | Admitting: Obstetrics and Gynecology

## 2015-04-15 ENCOUNTER — Ambulatory Visit: Payer: Medicare Other | Admitting: Podiatry

## 2015-05-15 ENCOUNTER — Telehealth: Payer: Self-pay | Admitting: *Deleted

## 2015-05-15 ENCOUNTER — Encounter: Payer: Self-pay | Admitting: Obstetrics and Gynecology

## 2015-05-15 ENCOUNTER — Ambulatory Visit (INDEPENDENT_AMBULATORY_CARE_PROVIDER_SITE_OTHER): Payer: Medicare Other | Admitting: Obstetrics and Gynecology

## 2015-05-15 VITALS — BP 130/83 | HR 69 | Temp 98.5°F | Ht 61.0 in

## 2015-05-15 DIAGNOSIS — R21 Rash and other nonspecific skin eruption: Secondary | ICD-10-CM

## 2015-05-15 DIAGNOSIS — L299 Pruritus, unspecified: Secondary | ICD-10-CM | POA: Diagnosis not present

## 2015-05-15 DIAGNOSIS — Z0000 Encounter for general adult medical examination without abnormal findings: Secondary | ICD-10-CM

## 2015-05-15 DIAGNOSIS — Z23 Encounter for immunization: Secondary | ICD-10-CM

## 2015-05-15 DIAGNOSIS — R569 Unspecified convulsions: Secondary | ICD-10-CM | POA: Diagnosis not present

## 2015-05-15 MED ORDER — LORATADINE 5 MG/5ML PO SYRP: 10.0000 mg | ORAL_SOLUTION | Freq: Every day | ORAL | Status: DC | PRN

## 2015-05-15 MED ORDER — LEVETIRACETAM 500 MG PO TABS: 500.0000 mg | ORAL_TABLET | Freq: Two times a day (BID) | ORAL | Status: DC

## 2015-05-15 NOTE — Patient Instructions (Signed)
Antihistamine prescribed for itching.  Refilled Keppra  Pruritus  Pruritus is an itch. There are many different problems that can cause an itch. Dry skin is one of the most common causes of itching. Most cases of itching do not require medical attention.  HOME CARE INSTRUCTIONS  Make sure your skin is moistened on a regular basis. A moisturizer that contains petroleum jelly is best for keeping moisture in your skin. If you develop a rash, you may try the following for relief:   Use corticosteroid cream.  Apply cool compresses to the affected areas.  Bathe with Epsom salts or baking soda in the bathwater.  Soak in colloidal oatmeal baths. These are available at your pharmacy.  Apply baking soda paste to the rash. Stir water into baking soda until it reaches a paste-like consistency.  Use an anti-itch lotion.  Take over-the-counter diphenhydramine medicine by mouth as the instructions direct.  Avoid scratching. Scratching may cause the rash to become infected. If itching is very bad, your caregiver may suggest prescription lotions or creams to lessen your symptoms.  Avoid hot showers, which can make itching worse. A cold shower may help with itching as long as you use a moisturizer after the shower. SEEK MEDICAL CARE IF: The itching does not go away after several days. Document Released: 05/05/2011 Document Revised: 01/07/2014 Document Reviewed: 05/05/2011 [facility] Patient Information 2015 [place], [place]. This information is not intended to replace advice given to you by your health care provider. Make sure you discuss any questions you have with your health care provider.

## 2015-05-15 NOTE — Progress Notes (Signed)
     Subjective: Chief Complaint  Patient presents with  . Medication Problem    History obtained from daughter as patient nonverbal due to end stage dementia.   HPI: Ann [name] is a 81 y.o. presenting to clinic today to discuss the following:  #Rash: -still present per family; now going on 2 months -describes rash as red spots over abdomen -Continues to itch mainly on her right abdomen -has gotten better in appearance but still itching -were prescribed triamcinolone cream without improvement. Has also tried cortisone cream. -daughter even thought patient might have had shingles and gave her some of her acyclovir -denies fevers, chills, pain -no known insect exposure or animal exposure, no new detergents or lotions  #Seizure Disorder: -no recent seizures -keppra working; needs refill -Has not followed up with neurologist since hospital discharge   Health Maintenance: Due for vaccines.    ROS in HPI.  Past Medical, Surgical, Social, and Family History Reviewed & Updated per EMR.   Objective: BP 130/83 mmHg  Pulse 69  Temp(Src) 98.5 F (36.9 C) (Oral)  Ht 5\' 1"  (1.549 m)  Wt   Physical Exam   Assessment/Plan: Please see problem based Assessment and Plan   Orders Placed This Encounter  Procedures  . Flu Vaccine QUAD 36+ mos IM  . Pneumococcal conjugate vaccine 13-valent    Meds ordered this encounter  Medications  . levETIRAcetam (KEPPRA) 500 MG tablet    Sig: Take 1 tablet (500 mg total) by mouth 2 (two) times daily.    Dispense:  60 tablet    Refill:  0  . loratadine (CLARITIN) 5 MG/5ML syrup    Sig: Take 10 mLs (10 mg total) by mouth daily as needed for itching.    Dispense:  120 mL    Refill:  2     Caryl Ada, DO PGY-2, [facility]

## 2015-05-15 NOTE — Telephone Encounter (Signed)
Received a refill request for Iprat-Albut 0.5-0.3 mg/3 Ml.  Medication is not listed on current medication list. Fax placed in provider box for review.   Clovis Pu, RN

## 2015-05-20 DIAGNOSIS — Z0000 Encounter for general adult medical examination without abnormal findings: Secondary | ICD-10-CM | POA: Insufficient documentation

## 2015-05-20 NOTE — Assessment & Plan Note (Signed)
No recent seizure activity. Continue keppra. Refill given. Strongly encouraged patient to follow-up with neurology. Will obtain levels at next visit.

## 2015-05-20 NOTE — Assessment & Plan Note (Signed)
Received pneumococcal and flu vaccine today.

## 2015-05-20 NOTE — Assessment & Plan Note (Signed)
No rash on exam but does have nonspecific skin eruption. There are scattered nonblanching petechia across upper abdomen. Area is painful to patient. No excoriations appreciated or skin breakdown. Etiology still unknown. No concerning signs on exam. Rx for Claritin to help with pruritis. Potential differential includes possible exposure or insect bite. Discussed avoidance of any irritating/fragrented lotions or soaps. If continues to bother patient at follow-up will place dermatology referral due to chronicity of issue.

## 2015-05-23 ENCOUNTER — Ambulatory Visit (INDEPENDENT_AMBULATORY_CARE_PROVIDER_SITE_OTHER): Payer: Medicare Other | Admitting: Podiatry

## 2015-05-23 ENCOUNTER — Encounter: Payer: Self-pay | Admitting: Podiatry

## 2015-05-23 DIAGNOSIS — B351 Tinea unguium: Secondary | ICD-10-CM | POA: Diagnosis not present

## 2015-05-23 DIAGNOSIS — M79673 Pain in unspecified foot: Secondary | ICD-10-CM | POA: Diagnosis not present

## 2015-05-23 DIAGNOSIS — M79609 Pain in unspecified limb: Principal | ICD-10-CM

## 2015-05-23 DIAGNOSIS — I739 Peripheral vascular disease, unspecified: Secondary | ICD-10-CM

## 2015-05-23 NOTE — Progress Notes (Signed)
Patient ID: Ann [name], female   DOB: 1928-06-15, 81 y.o.   MRN: [id number] HPI  Complaint:  Visit Type: Patient presents  to my office for continued preventative foot care services. Complaint: Patient states" my nails have grown long and thick and become painful to walk and wear shoes" Patient has been diagnosed with DM and had BK amputation right leg. . This patient  presents for preventative foot care service left foot. No changes to ROS  Podiatric Exam: Vascular: dorsalis pedis and posterior tibial pulses are non-palpable left foot. Capillary return is diminished. Cold feet noted.. Skin turgor WNL,   Sensorium: Normal Semmes Weinstein monofilament test. Normal tactile sensation bilaterally.  Nail Exam: Pt has thick disfigured discolored nails with subungual debris noted bilateral entire nail hallux through fifth toenails Ulcer Exam: There is no evidence of ulcer or pre-ulcerative changes or infection. Orthopedic Exam: Muscle tone and strength are WNL. No limitations in general ROM. No crepitus or effusions noted. Foot type and digits show no abnormalities. Bony prominences are unremarkable. Skin: No Porokeratosis. No infection or ulcers  Diagnosis:  Onychomycosis, pain in left toes  Treatment & Plan Procedures and Treatment: Consent by patient was obtained for treatment procedures. The patient understood the discussion of treatment and procedures well. All questions were answered thoroughly reviewed. Debridement of mycotic and hypertrophic toenails, 1 through 5 left foot and clearing of subungual debris. No ulceration, no infection noted.  Return Visit-Office Procedure: Patient instructed to return to the office for a follow up visit 3 months for continued evaluation and treatment.

## 2015-05-30 ENCOUNTER — Other Ambulatory Visit: Payer: Self-pay | Admitting: Obstetrics and Gynecology

## 2015-05-30 NOTE — Telephone Encounter (Signed)
Claritin is not helping the itching. She is still itching, could she get something stronger Please advise

## 2015-06-02 ENCOUNTER — Telehealth: Payer: Self-pay | Admitting: Obstetrics and Gynecology

## 2015-06-02 NOTE — Telephone Encounter (Signed)
Will forward to MD. Marti Mclane,CMA  

## 2015-06-02 NOTE — Telephone Encounter (Signed)
The med given for her rash is not helping. Could something differnent be called in? [facility] outpatient pharmacy

## 2015-06-03 NOTE — Telephone Encounter (Signed)
Daughter called back and will try creams.  If no relief she will bring patient back in to clinic. Jazmin Hartsell,CMA

## 2015-06-03 NOTE — Telephone Encounter (Signed)
Patient last seen in clinic and areas of concern are not consistent with a rash. It appears that it is just dry skin causing pruritis. If steroid cream not working my best advice would be to use a lotion such as Eucerin or Aveeno for dry skin. If thinks it is worsening can follow-up in clinic in [place] or with me.

## 2015-06-03 NOTE — Telephone Encounter (Signed)
LM for daughter to call back to clinic. Jerzi Tigert,CMA

## 2015-06-06 ENCOUNTER — Other Ambulatory Visit: Payer: Self-pay | Admitting: Obstetrics and Gynecology

## 2015-06-16 ENCOUNTER — Other Ambulatory Visit: Payer: Self-pay | Admitting: Obstetrics and Gynecology

## 2015-06-26 ENCOUNTER — Other Ambulatory Visit: Payer: Self-pay | Admitting: Obstetrics and Gynecology

## 2015-07-03 ENCOUNTER — Other Ambulatory Visit: Payer: Self-pay | Admitting: Obstetrics and Gynecology

## 2015-07-03 NOTE — Telephone Encounter (Signed)
Will forward to MD to inquire about this. Jazmin Hartsell,CMA

## 2015-07-03 NOTE — Telephone Encounter (Signed)
Daughter wanted to know why she's not on the atorvastatin any more.  Please advise.

## 2015-07-03 NOTE — Telephone Encounter (Signed)
Please inform daughter that medication was discontinued back when patient was hospitalized 12/2014. The risk in her age group outweigh the benefits at this time. Also her last cholesterol check that was performed this year was normal. I will continue to monitor levels yearly and if they increase we can discuss restarting.

## 2015-07-14 ENCOUNTER — Other Ambulatory Visit: Payer: Self-pay | Admitting: Obstetrics and Gynecology

## 2015-08-07 ENCOUNTER — Ambulatory Visit: Payer: Medicare Other | Admitting: Podiatry

## 2015-08-08 ENCOUNTER — Ambulatory Visit: Payer: Medicare Other | Admitting: Podiatry

## 2015-08-12 ENCOUNTER — Other Ambulatory Visit: Payer: Self-pay | Admitting: *Deleted

## 2015-08-12 MED ORDER — MEMANTINE HCL 10 MG PO TABS: 10.0000 mg | ORAL_TABLET | Freq: Two times a day (BID) | ORAL | Status: DC

## 2015-08-18 ENCOUNTER — Other Ambulatory Visit: Payer: Self-pay | Admitting: Family Medicine

## 2015-09-04 ENCOUNTER — Telehealth: Payer: Self-pay | Admitting: Obstetrics and Gynecology

## 2015-09-04 NOTE — Telephone Encounter (Signed)
Patient's granddaughter called back requesting that doctor do a home visit for possible pressure ulcer on her bottom.  Advised her that she should be seen in the office for any assessment.  Granddaughter stated that they can't transport patient due to weakness.  Every time she comes for an appointment patient is left in the chair and a full assessment or lab work is not done per granddaughter.  Granddaughter stated that a doctor did a home visit before and requesting for a doctor to come back out.  Advised her that if they are having a problem transporting they should call EMS, have patient transported to ED where they can transfer from bed to bed and complete a full assessment with lab work or imaging if needed.  If they were really concerned instead of waiting for a doctor to come out for a home visit.  If patient is admitted they can set up home health services for a nurse to care for the pressure ulcer.  Patient also can get wound care in the hospital.  She kept assisting that a doctor come out; informed her again that the provider who is Dr. Perley Jain is on vacation will not return until next week.  Advised her to call EMS again to have patient transported to ED for further evaluation.  Will forward to PCP. Clovis Pu, RN

## 2015-09-04 NOTE — Telephone Encounter (Signed)
Left message for patient's granddaughter to call nurse back.  Clovis Pu, RN

## 2015-09-04 NOTE — Telephone Encounter (Signed)
Pt granddaughter calling because pt recently had an amputated leg and they feel as if she needs attention. She would like to speak to a member of the clinical staff. Sadie Reynolds, ASA

## 2015-09-15 MED FILL — AMIODARONE HCL 200 MG TAB: 200 | 30 days supply | Qty: 60 | Fill #5

## 2015-09-15 MED FILL — XARELTO 15 MG TABLET: 15 | 30 days supply | Qty: 30 | Fill #6

## 2015-09-22 MED FILL — AMLODIPINE BESYLATE 10 MG T: 10 | 30 days supply | Qty: 30 | Fill #1

## 2015-09-24 MED FILL — levETIRAcetam 500 MG TABS: 500 | 30 days supply | Qty: 60 | Fill #2

## 2015-09-30 MED FILL — CARVEDILOL 3.125 MG TABLET: 3.125 | 30 days supply | Qty: 60 | Fill #5

## 2015-10-13 ENCOUNTER — Ambulatory Visit: Payer: Medicare Other | Admitting: Family Medicine

## 2015-10-30 ENCOUNTER — Other Ambulatory Visit: Payer: Self-pay | Admitting: Obstetrics and Gynecology

## 2015-10-30 MED FILL — AMLODIPINE BESYLATE 10 MG T: 10 | 30 days supply | Qty: 30 | Fill #2

## 2015-10-31 MED FILL — XARELTO 15 MG TABLET: 15 | 30 days supply | Qty: 30 | Fill #0

## 2015-11-10 ENCOUNTER — Emergency Department (HOSPITAL_COMMUNITY)
Admit: 2015-11-10 | Discharge: 2015-11-10 | Disposition: A | Discharge status: Home / Self Care | Payer: Medicare Other | Attending: Emergency Medicine | Admitting: Emergency Medicine

## 2015-11-10 ENCOUNTER — Encounter (HOSPITAL_COMMUNITY): Payer: Self-pay | Admitting: Emergency Medicine

## 2015-11-10 ENCOUNTER — Inpatient Hospital Stay (HOSPITAL_COMMUNITY)
Admission: EM | Admit: 2015-11-10 | Discharge: 2015-11-19 | DRG: 240 | Disposition: A | Discharge status: Hospice | Payer: Medicare Other | Attending: Family Medicine | Admitting: Family Medicine

## 2015-11-10 ENCOUNTER — Emergency Department (HOSPITAL_COMMUNITY): Payer: Medicare Other

## 2015-11-10 DIAGNOSIS — G309 Alzheimer's disease, unspecified: Secondary | ICD-10-CM | POA: Diagnosis present

## 2015-11-10 DIAGNOSIS — K5900 Constipation, unspecified: Secondary | ICD-10-CM | POA: Diagnosis not present

## 2015-11-10 DIAGNOSIS — Z89612 Acquired absence of left leg above knee: Secondary | ICD-10-CM | POA: Diagnosis not present

## 2015-11-10 DIAGNOSIS — I7090 Unspecified atherosclerosis: Secondary | ICD-10-CM | POA: Diagnosis present

## 2015-11-10 DIAGNOSIS — E7800 Pure hypercholesterolemia, unspecified: Secondary | ICD-10-CM | POA: Diagnosis not present

## 2015-11-10 DIAGNOSIS — I480 Paroxysmal atrial fibrillation: Secondary | ICD-10-CM | POA: Diagnosis not present

## 2015-11-10 DIAGNOSIS — M79672 Pain in left foot: Secondary | ICD-10-CM | POA: Diagnosis not present

## 2015-11-10 DIAGNOSIS — R109 Unspecified abdominal pain: Secondary | ICD-10-CM | POA: Diagnosis not present

## 2015-11-10 DIAGNOSIS — F0280 Dementia in other diseases classified elsewhere without behavioral disturbance: Secondary | ICD-10-CM | POA: Diagnosis present

## 2015-11-10 DIAGNOSIS — Z7901 Long term (current) use of anticoagulants: Secondary | ICD-10-CM

## 2015-11-10 DIAGNOSIS — Z515 Encounter for palliative care: Secondary | ICD-10-CM | POA: Insufficient documentation

## 2015-11-10 DIAGNOSIS — K219 Gastro-esophageal reflux disease without esophagitis: Secondary | ICD-10-CM | POA: Diagnosis present

## 2015-11-10 DIAGNOSIS — M79673 Pain in unspecified foot: Secondary | ICD-10-CM

## 2015-11-10 DIAGNOSIS — I110 Hypertensive heart disease with heart failure: Secondary | ICD-10-CM | POA: Diagnosis not present

## 2015-11-10 DIAGNOSIS — R410 Disorientation, unspecified: Secondary | ICD-10-CM | POA: Diagnosis not present

## 2015-11-10 DIAGNOSIS — M79609 Pain in unspecified limb: Secondary | ICD-10-CM

## 2015-11-10 DIAGNOSIS — E860 Dehydration: Secondary | ICD-10-CM | POA: Diagnosis not present

## 2015-11-10 DIAGNOSIS — I749 Embolism and thrombosis of unspecified artery: Secondary | ICD-10-CM

## 2015-11-10 DIAGNOSIS — L8990 Pressure ulcer of unspecified site, unspecified stage: Secondary | ICD-10-CM | POA: Diagnosis present

## 2015-11-10 DIAGNOSIS — G500 Trigeminal neuralgia: Secondary | ICD-10-CM | POA: Diagnosis present

## 2015-11-10 DIAGNOSIS — H269 Unspecified cataract: Secondary | ICD-10-CM | POA: Diagnosis present

## 2015-11-10 DIAGNOSIS — E785 Hyperlipidemia, unspecified: Secondary | ICD-10-CM | POA: Diagnosis not present

## 2015-11-10 DIAGNOSIS — I70262 Atherosclerosis of native arteries of extremities with gangrene, left leg: Secondary | ICD-10-CM | POA: Diagnosis not present

## 2015-11-10 DIAGNOSIS — L97929 Non-pressure chronic ulcer of unspecified part of left lower leg with unspecified severity: Secondary | ICD-10-CM | POA: Diagnosis present

## 2015-11-10 DIAGNOSIS — Z89619 Acquired absence of unspecified leg above knee: Secondary | ICD-10-CM | POA: Insufficient documentation

## 2015-11-10 DIAGNOSIS — Z86718 Personal history of other venous thrombosis and embolism: Secondary | ICD-10-CM

## 2015-11-10 DIAGNOSIS — R569 Unspecified convulsions: Secondary | ICD-10-CM | POA: Diagnosis not present

## 2015-11-10 DIAGNOSIS — I959 Hypotension, unspecified: Secondary | ICD-10-CM | POA: Diagnosis not present

## 2015-11-10 DIAGNOSIS — I5022 Chronic systolic (congestive) heart failure: Secondary | ICD-10-CM | POA: Diagnosis not present

## 2015-11-10 DIAGNOSIS — M79606 Pain in leg, unspecified: Secondary | ICD-10-CM | POA: Diagnosis not present

## 2015-11-10 DIAGNOSIS — R4182 Altered mental status, unspecified: Secondary | ICD-10-CM | POA: Insufficient documentation

## 2015-11-10 DIAGNOSIS — I96 Gangrene, not elsewhere classified: Secondary | ICD-10-CM | POA: Diagnosis not present

## 2015-11-10 DIAGNOSIS — H9190 Unspecified hearing loss, unspecified ear: Secondary | ICD-10-CM | POA: Diagnosis not present

## 2015-11-10 DIAGNOSIS — R1310 Dysphagia, unspecified: Secondary | ICD-10-CM | POA: Diagnosis not present

## 2015-11-10 DIAGNOSIS — R52 Pain, unspecified: Secondary | ICD-10-CM | POA: Diagnosis not present

## 2015-11-10 DIAGNOSIS — L97129 Non-pressure chronic ulcer of left thigh with unspecified severity: Secondary | ICD-10-CM | POA: Diagnosis not present

## 2015-11-10 DIAGNOSIS — Z66 Do not resuscitate: Secondary | ICD-10-CM | POA: Diagnosis not present

## 2015-11-10 DIAGNOSIS — L89312 Pressure ulcer of right buttock, stage 2: Secondary | ICD-10-CM | POA: Diagnosis present

## 2015-11-10 DIAGNOSIS — D62 Acute posthemorrhagic anemia: Secondary | ICD-10-CM | POA: Diagnosis not present

## 2015-11-10 DIAGNOSIS — N390 Urinary tract infection, site not specified: Secondary | ICD-10-CM | POA: Diagnosis not present

## 2015-11-10 DIAGNOSIS — Z89511 Acquired absence of right leg below knee: Secondary | ICD-10-CM | POA: Diagnosis not present

## 2015-11-10 DIAGNOSIS — I998 Other disorder of circulatory system: Secondary | ICD-10-CM | POA: Diagnosis not present

## 2015-11-10 DIAGNOSIS — M79605 Pain in left leg: Secondary | ICD-10-CM | POA: Diagnosis not present

## 2015-11-10 LAB — CBC WITH DIFFERENTIAL/PLATELET
BASOS ABS: 0 10*3/uL (ref 0.0–0.1)
BASOS PCT: 0 %
Eosinophils Absolute: 0 10*3/uL (ref 0.0–0.7)
Eosinophils Relative: 0 %
HEMATOCRIT: 37.3 % (ref 36.0–46.0)
Hemoglobin: 12.5 g/dL (ref 12.0–15.0)
Lymphocytes Relative: 7 %
Lymphs Abs: 0.8 10*3/uL (ref 0.7–4.0)
MCH: 26.7 pg (ref 26.0–34.0)
MCHC: 33.5 g/dL (ref 30.0–36.0)
MCV: 79.7 fL (ref 78.0–100.0)
MONO ABS: 0.9 10*3/uL (ref 0.1–1.0)
Monocytes Relative: 8 %
NEUTROS ABS: 9.2 10*3/uL — AB (ref 1.7–7.7)
Neutrophils Relative %: 85 %
Platelets: 223 10*3/uL (ref 150–400)
RBC: 4.68 MIL/uL (ref 3.87–5.11)
RDW: 19.4 % — ABNORMAL HIGH (ref 11.5–15.5)
WBC: 10.9 10*3/uL — AB (ref 4.0–10.5)

## 2015-11-10 LAB — COMPREHENSIVE METABOLIC PANEL
ALBUMIN: 2.7 g/dL — AB (ref 3.5–5.0)
ALK PHOS: 83 U/L (ref 38–126)
ALT: 34 U/L (ref 14–54)
AST: 117 U/L — AB (ref 15–41)
Anion gap: 11 (ref 5–15)
BUN: 21 mg/dL — ABNORMAL HIGH (ref 6–20)
CALCIUM: 8.7 mg/dL — AB (ref 8.9–10.3)
CO2: 26 mmol/L (ref 22–32)
CREATININE: 1.21 mg/dL — AB (ref 0.44–1.00)
Chloride: 103 mmol/L (ref 101–111)
GFR calc Af Amer: 46 mL/min — ABNORMAL LOW (ref >60)
GFR calc non Af Amer: 40 mL/min — ABNORMAL LOW (ref >60)
GLUCOSE: 119 mg/dL — AB (ref 65–99)
Potassium: 4 mmol/L (ref 3.5–5.1)
SODIUM: 140 mmol/L (ref 135–145)
Total Bilirubin: 3.3 mg/dL — ABNORMAL HIGH (ref 0.3–1.2)
Total Protein: 7.4 g/dL (ref 6.5–8.1)

## 2015-11-10 LAB — URINE MICROSCOPIC-ADD ON

## 2015-11-10 LAB — URINALYSIS, ROUTINE W REFLEX MICROSCOPIC
GLUCOSE, UA: NEGATIVE mg/dL
Ketones, ur: 15 mg/dL — AB
Nitrite: POSITIVE — AB
Protein, ur: 100 mg/dL — AB
SPECIFIC GRAVITY, URINE: 1.028 (ref 1.005–1.030)
pH: 5.5 (ref 5.0–8.0)

## 2015-11-10 LAB — LACTIC ACID, PLASMA
Lactic Acid, Venous: 1.7 mmol/L (ref 0.5–2.0)
Lactic Acid, Venous: 2 mmol/L (ref 0.5–2.0)

## 2015-11-10 MED ORDER — MEMANTINE HCL 10 MG PO TABS
10.0000 mg | ORAL_TABLET | Freq: Two times a day (BID) | ORAL | Status: DC
  Administered 2015-11-10 – 2015-11-19 (×11): 10 mg via ORAL
  Filled 2015-11-10 (×14): qty 1

## 2015-11-10 MED ORDER — CEPHALEXIN 500 MG PO CAPS: 500.0000 mg | ORAL_CAPSULE | Freq: Four times a day (QID) | ORAL | Status: DC

## 2015-11-10 MED ORDER — POLYETHYLENE GLYCOL 3350 17 G PO PACK
17.0000 g | PACK | Freq: Two times a day (BID) | ORAL | Status: DC
  Administered 2015-11-12 – 2015-11-15 (×2): 17 g via ORAL
  Filled 2015-11-10 (×4): qty 1

## 2015-11-10 MED ORDER — AMIODARONE HCL 200 MG PO TABS
200.0000 mg | ORAL_TABLET | Freq: Two times a day (BID) | ORAL | Status: DC
Start: 2015-11-10 — End: 2015-11-19
  Administered 2015-11-10 – 2015-11-19 (×10): 200 mg via ORAL
  Filled 2015-11-10 (×14): qty 1

## 2015-11-10 MED ORDER — SODIUM CHLORIDE 0.9% FLUSH
3.0000 mL | Freq: Two times a day (BID) | INTRAVENOUS | Status: DC
  Administered 2015-11-10 – 2015-11-18 (×14): 3 mL via INTRAVENOUS

## 2015-11-10 MED ORDER — ATORVASTATIN CALCIUM 20 MG PO TABS
20.0000 mg | ORAL_TABLET | Freq: Every day | ORAL | Status: DC
  Administered 2015-11-14 – 2015-11-19 (×5): 20 mg via ORAL
  Filled 2015-11-10 (×2): qty 1
  Filled 2015-11-10: qty 2
  Filled 2015-11-10 (×4): qty 1

## 2015-11-10 MED ORDER — HEPARIN (PORCINE) IN NACL 100-0.45 UNIT/ML-% IJ SOLN: 800.0000 [IU]/h | INTRAMUSCULAR | Status: DC

## 2015-11-10 MED ORDER — AMLODIPINE BESYLATE 10 MG PO TABS: 10.0000 mg | ORAL_TABLET | Freq: Every day | ORAL | Status: DC

## 2015-11-10 MED ORDER — DEXTROSE 5 % IV SOLN
1.0000 g | Freq: Once | INTRAVENOUS | Status: AC
  Administered 2015-11-10: 1 g via INTRAVENOUS
  Filled 2015-11-10: qty 10

## 2015-11-10 MED ORDER — SODIUM CHLORIDE 0.9 % IV BOLUS (SEPSIS)
1000.0000 mL | Freq: Once | INTRAVENOUS | Status: AC
  Administered 2015-11-10: 1000 mL via INTRAVENOUS

## 2015-11-10 MED ORDER — CARVEDILOL 3.125 MG PO TABS: 3.1250 mg | ORAL_TABLET | Freq: Two times a day (BID) | ORAL | Status: DC

## 2015-11-10 MED ORDER — SODIUM CHLORIDE 0.9 % IV SOLN
INTRAVENOUS | Status: DC
  Administered 2015-11-10 – 2015-11-11 (×3): via INTRAVENOUS

## 2015-11-10 MED ORDER — DEXTROSE 5 % IV SOLN
1.0000 g | INTRAVENOUS | Status: AC
  Administered 2015-11-12 – 2015-11-17 (×6): 1 g via INTRAVENOUS
  Filled 2015-11-10 (×7): qty 10

## 2015-11-10 MED ORDER — LEVETIRACETAM 500 MG PO TABS
500.0000 mg | ORAL_TABLET | Freq: Two times a day (BID) | ORAL | Status: DC
  Administered 2015-11-10: 500 mg via ORAL
  Filled 2015-11-10 (×2): qty 1

## 2015-11-10 NOTE — ED Notes (Signed)
Please note that family did tell me that pt has a bed sore on her "bottom" and there is a 1inchx1inch open area (sheered off skin?) on her left lateral lower extremity

## 2015-11-10 NOTE — ED Provider Notes (Signed)
CSN: [id number]     Arrival date & time 11/10/15  1135 History   First MD Initiated Contact with Patient 11/10/15 1145     Chief Complaint  Patient presents with  . Leg Pain     (Consider location/radiation/quality/duration/timing/severity/associated sxs/prior Treatment) Patient is a 82 y.o. female presenting with leg pain. The history is provided by medical records and a caregiver. The history is limited by the condition of the patient.  Leg Pain Location:  Leg Injury: no   Leg location:  L leg and L lower leg Pain details:    Severity:  Mild   Onset quality:  Unable to specify   Timing:  Unable to specify   Progression:  Unchanged Chronicity:  New Dislocation: no   Prior injury to area:  No Relieved by:  None tried Worsened by:  Nothing tried Ineffective treatments:  None tried Associated symptoms: no back pain, no decreased ROM, no fever and no neck pain     Past Medical History  Diagnosis Date  . Hypertension   . Dementia   . Hyperlipidemia   . Seizures (HCC)   . Metatarsal bone fracture     Right - base of 5th  . DVT (deep venous thrombosis) (HCC) 02/22/2013    02/18/2013: Left lower extremity (femoral and popliteal) subacute Treated with coumadin x 6-7 months    . Stroke (HCC)   . Trigeminal neuralgia 11/03/2006    Qualifier: Diagnosis of  By: Abundio Miu    . CATARACT 11/03/2006    Qualifier: Diagnosis of  By: Abundio Miu    . HEARING LOSS NOS OR DEAFNESS 11/03/2006    Qualifier: Diagnosis of  By: Abundio Miu    . INCONTINENCE, URGE 11/03/2006    Qualifier: Diagnosis of  By: Abundio Miu    . Gangrene (HCC)   . Atrial fibrillation (HCC)   . Shortness of breath dyspnea    Past Surgical History  Procedure Laterality Date  . Toe surgery      right 5th toe amputation  . Amputation Right 10/26/2013    Procedure: AMPUTATION BELOW KNEE;  Surgeon: Nadara Mustard, MD;  Location: [facility] OR;  Service: Orthopedics;  Laterality: Right;  Right Below Knee  Amputation  . Abdominal aortagram N/A 09/13/2013    Procedure: ABDOMINAL Ronny Flurry;  Surgeon: Fransisco Hertz, MD;  Location: [facility] CATH LAB;  Service: Cardiovascular;  Laterality: N/A;  . Below knee leg amputation Right 10/2013   Family History  Problem Relation Age of Onset  . Hypertension Mother   . Arthritis Father   . Heart disease Daughter     Heart Disease before age 94  . Hyperlipidemia Daughter   . Hypertension Daughter    Social History  Substance Use Topics  . Smoking status: Never Smoker   . Smokeless tobacco: Never Used  . Alcohol Use: No   OB History    No data available     Review of Systems  Constitutional: Negative for fever and chills.  Musculoskeletal: Negative for back pain and neck pain.       Left foot pain  Skin: Positive for wound (sacral).  All other systems reviewed and are negative.     Allergies  Lisinopril  Home Medications   Prior to Admission medications   Medication Sig Start Date End Date Taking? Authorizing Provider  amiodarone (PACERONE) 200 MG tablet TAKE 1 TABLET BY MOUTH TWICE DAILY 11/29/14  Yes Pincus Large, DO  amLODipine (NORVASC) 10 MG tablet TAKE 1  TABLET BY MOUTH DAILY. 08/18/15  Yes Pincus Large, DO  carvedilol (COREG) 3.125 MG tablet TAKE 1 TABLET BY MOUTH TWICE DAILY WITH A MEAL 12/09/14  Yes Pincus Large, DO  levETIRAcetam (KEPPRA) 500 MG tablet TAKE 1 TABLET BY MOUTH 2 TIMES DAILY. 06/26/15  Yes Pincus Large, DO  loratadine (CLARITIN) 5 MG/5ML syrup Take 10 mLs (10 mg total) by mouth daily as needed for itching. 05/15/15  Yes Pincus Large, DO  memantine (NAMENDA) 10 MG tablet Take 1 tablet (10 mg total) by mouth 2 (two) times daily. 08/12/15  Yes Pincus Large, DO  polyethylene glycol (MIRALAX / GLYCOLAX) packet Take 17 g by mouth daily as needed for moderate constipation.   Yes Historical Provider, MD  triamcinolone cream (KENALOG) 0.1 % Apply 1 application topically 2 (two) times daily. 03/19/15  Yes Jamal Collin, MD   XARELTO 15 MG TABS tablet TAKE 1 TABLET BY MOUTH DAILY 10/31/15  Yes Pincus Large, DO  cephALEXin (KEFLEX) 500 MG capsule Take 1 capsule (500 mg total) by mouth 4 (four) times daily. 11/10/15   Barbara Cower Jaskirat Schwieger, MD   BP 105/77 mmHg  Pulse 86  Temp(Src) 99.3 F (37.4 C) (Oral)  Resp 23  SpO2 98% Physical Exam  Constitutional: She appears well-developed and well-nourished.  HENT:  Head: Normocephalic and atraumatic.  Neck: Normal range of motion.  Cardiovascular: Normal rate and regular rhythm.   Pulmonary/Chest: No stridor. No respiratory distress.  Abdominal: She exhibits no distension.  Musculoskeletal:  Right BKA Left cold foot, nonpalpable pulse, non dopplerable pulse  Neurological: She is alert.  Nursing note and vitals reviewed.   ED Course  Procedures (including critical care time) Labs Review Labs Reviewed  CBC WITH DIFFERENTIAL/PLATELET - Abnormal; Notable for the following:    WBC 10.9 (*)    RDW 19.4 (*)    Neutro Abs 9.2 (*)    All other components within normal limits  COMPREHENSIVE METABOLIC PANEL - Abnormal; Notable for the following:    Glucose, Bld 119 (*)    BUN 21 (*)    Creatinine, Ser 1.21 (*)    Calcium 8.7 (*)    Albumin 2.7 (*)    AST 117 (*)    Total Bilirubin 3.3 (*)    GFR calc non Af Amer 40 (*)    GFR calc Af Amer 46 (*)    All other components within normal limits  URINALYSIS, ROUTINE W REFLEX MICROSCOPIC (NOT AT [facility]) - Abnormal; Notable for the following:    Color, Urine ORANGE (*)    APPearance CLOUDY (*)    Hgb urine dipstick LARGE (*)    Bilirubin Urine MODERATE (*)    Ketones, ur 15 (*)    Protein, ur 100 (*)    Nitrite POSITIVE (*)    Leukocytes, UA SMALL (*)    All other components within normal limits  URINE MICROSCOPIC-ADD ON - Abnormal; Notable for the following:    Squamous Epithelial / LPF 0-5 (*)    Bacteria, UA RARE (*)    Casts HYALINE CASTS (*)    All other components within normal limits  LACTIC ACID, PLASMA   LACTIC ACID, PLASMA    Imaging Review No results found. I have personally reviewed and evaluated these images and lab results as part of my medical decision-making.   EKG Interpretation   Date/Time:  Monday November 10 2015 11:50:19 EST Ventricular Rate:  84 PR Interval:    QRS Duration: 110 QT  Interval:  508 QTC Calculation: 601 R Axis:   115 Text Interpretation:  Right and left arm electrode reversal,  interpretation assumes no reversal Atrial fibrillation Right axis  deviation Low voltage, extremity leads Consider anterior infarct  Nonspecific T abnormalities, lateral leads Prolonged QT interval No  significant change since last tracing Confirmed by [facility] MD, Barbara Cower  [phone]) on 11/10/2015 12:43:51 PM      MDM   Final diagnoses:  Foot pain  UTI (lower urinary tract infection)    Cold foot, non dopplerable pulse, also with decreased PO intake. Concern for arterial occlusion and infection, will eal appropriately.   Per vas lab no flow in left leg, vascular surgery consulted. Also with UTI on UA, will start rocephin,   Vascular surgery said no revascularization options, consulted Dr. Lajoyce Corners who did other BKA, who stated he would be willing to to do bka on left but wanted xarelto time to wash out first.   D/w family medicine and will admit.   Marily Memos, MD 11/10/15 [phone]

## 2015-11-10 NOTE — ED Notes (Signed)
Doppler at beside

## 2015-11-10 NOTE — Progress Notes (Addendum)
*  PRELIMINARY RESULTS* Vascular Ultrasound Left Lower Extremity Arterial Duplex has been completed. Study was technically limited and difficult due to poor patient cooperation, patient position, and patient pain. Unable to identify flow in the left external iliac, common femoral, femoral, popliteal, posterior tibial, and peroneal arteries by color and spectral doppler; this is suggestive of extensive occlusion.  The mid left femoral artery appears to have multiple areas of dilatation, measuring 1.83cm AP in its largest visualized dimension, and 0.98cm in its smallest in that area.  The right common femoral artery is patent with dampened monophasic flow. Unable to identify flow in the right proximal and mid femoral arteries by color and spectral doppler, suggestive of occlusion. The right profunda femoral artery is patent with monophasic flow.  The patient would not tolerate a blood pressure cuff on her left ankle to perform ABI. Due to technical limitations, unable to evaluate the left dorsalis pedis artery by duplex.  Left lower extremity venous duplex completed. Left lower extremity is negative for deep vein thrombosis. There is no evidence of left Baker's cyst.  Incidental finding: Bilateral lower extremity veins exhibit pulsatile flow, suggestive of elevated right sided heart pressure.  Preliminary results discussed with Dr. Clayborne Dana.  11/10/2015 12:50 PM  Gertie Fey, RVT, RDCS, RDMS

## 2015-11-10 NOTE — Care Management Note (Signed)
Case Management Note  Patient Details  Name: Ann [name] MRN: [id number] Date of Birth: March 25, 1928  Subjective/Objective:                  82 y.o. female presenting with leg pain. //Home with adult children.  Action/Plan: Follow for disposition needs; set up home health services.   Expected Discharge Date:       11/10/15           Expected Discharge Plan:  Home w Home Health Services  In-House Referral:  NA  Discharge planning Services  CM Consult  Post Acute Care Choice:  Home Health Choice offered to:  Adult Children  DME Arranged:  N/A DME Agency:  NA  HH Arranged:  RN HH Agency:  [facility]  Status of Service:  Completed, signed off  Medicare Important Message Given:    Date Medicare IM Given:    Medicare IM give by:    Date Additional Medicare IM Given:    Additional Medicare Important Message give by:     If discussed at Long Length of Stay Meetings, dates discussed:    Additional Comments: [name] J. Lucretia Roers, RN, BSN, [employer] [phone] Spoke with pt and adult daughter at bedside regarding discharge planning for Home Health Services. Offered pt list of home health agencies to choose from.  Pt daughter chose [facility] to render services. Angela Adam of [facility] notified.  No DME needs identified at this time.  Oletta Cohn, RN 11/10/2015, 3:40 PM

## 2015-11-10 NOTE — ED Notes (Signed)
Pt was taken up to floor by EMT after report was called

## 2015-11-10 NOTE — Progress Notes (Signed)
Pt admitted to room 3e23, temp 100, placed on tele.  Paged family medicine for orders.

## 2015-11-10 NOTE — H&P (Signed)
Family Medicine [facility] Admission History and Physical Service Pager: [phone]  Patient name: Ann [name] Medical record number: [id number] Date of birth: 09/18/1927 Age: 82 y.o. Gender: female  Primary Care Provider: Caryl Ada, DO Consultants: VVS, Ortho  Code Status: Full per discussion on admission  Chief Complaint: left leg pain  Assessment and Plan: Ann [name] is a 82 y.o. female presenting with cold, painful left LE, confusion, and concerns for UTI . PMH is significant for PAD s/p R BKA, dementia, DVT, PAF, HTN, and seizures  PAD: patient with a h/o of R BKA with Dr. Lajoyce Corners on 10/26/13, now with cold, painful left lower extremity. An aortogram in 10/2013 revealed occlusion of the popliteal and below on the right as well.  Given this, VVS saw the patient and noted she was not a revascularization candidate.  Patient could have had a AKA tomorrow, however wanted to wait for Dr. Lajoyce Corners.  - admit to inpatient teaching service, Dr. Jennette Kettle  - will start Lipitor, unsure why this was discontinued previously.  - will hold xarelto and start heparin gtt - patient is in no pain unless her leg is touched- will hold off on pain control given this and fact patient is more somnolent recently.   Urinary tract infection: Urinalysis consistent with UTI with nitrite and leukocyte esterase. Daughter notes that she's been wanting salve on her vulva the last week due to burning with urination. No evidence of yeast infection on external inspection, did not perform full pelvic exam due to limited mobility and pain. This may be contributing to confusion. No fevers or lower back pain.  - urine culture - continue ceftriaxone  Confusion: concern from family that the patient was more somnolent and less conversant over the last week.  Neuro exam non-focal. U/A appears consistent with UTI with nitrite and leukocyte esterase.  May also be a component of dehydration given poor PO intake recently. -  treat UTI  - hydrate with NS @ 100cc/hr - No head CT done given non-focal exam. If worsening status or focal findings develop, obtain CT head  - NPO for now  Dementia: High risk for delirium -continue home Namenda  Paroxysmal atrial fibrillation: currently in NSR.  - continue home amiodarone  - hold Xareltro, transition to heparin gtt in preparation for surgery.   Constipation: Reported for approximately 2 weeks. This could be the etiology of the patient's poor PO intake. Abdominal exam unremarkable.  - increase MiraLax to BID - if no improvement, consider abdominal x-ray.   HTN: currently normal despite not taking any medications this AM, concerning for dehydration. - hold home amlodipine - continue home coreg   Seizures: No recent seizure like activity. - continue Keppra  FEN/GI: NPO, NS @ 100cc/hr  Prophylaxis: heparin gtt for afib   Disposition: admit to inpatient, attending Dr. Jennette Kettle  History of Present Illness:  Ann [name] is a 82 y.o. female presenting with left leg pain.  Over the last month her daughter notes she's had pain with palpaiton of her left LE which has gotten more severe. They have also noted her left leg to be colder than previously.  They did not try anything at home. They deny any complaints of back or abdominal pain.  Patient unable to participate. Family notes initially she is at her baseline mental status, however then notes she's been more fatigued and somnolent. At baseline, she can feed herself, can turn on the TV, and is conversant. Now, she's sleeping more  and not as talkative. They are trying to make her eat however she stopped eating over the last 1-2 weeks. Prior to this she's been having difficulty with BMs. They not no stools for some time.  Now having small loose stools with MiraLax but not frequent.   They denies fever, chills, hematochezia, melana, chest pain, SOB.   Review Of Systems: Per HPI with the following additions: None  Otherwise  the remainder of the systems were negative.  Patient Active Problem List   Diagnosis Date Noted  . Arterial occlusion (HCC) 11/10/2015  . Healthcare maintenance 05/20/2015  . Paroxysmal a-fib (HCC) 03/20/2015  . PAD (peripheral artery disease) (HCC) 03/20/2015  . Rash and nonspecific skin eruption 03/19/2015  . Dementia without behavioral disturbance 03/05/2015  . Genital symptoms, female 05/17/2014  . Chronic systolic CHF (congestive heart failure) (HCC) 02/08/2014  . Swallowing difficulty 12/13/2013  . Moderate malnutrition (HCC) 11/20/2013  . S/P BKA (below knee amputation) unilateral (HCC) 10/26/2013  . Shoulder mass 10/07/2011  . Keratosis of plantar aspect of foot 12/23/2010  . BACK PAIN 12/13/2008  . BUNIONS, BILATERAL 08/06/2008  . HYPERCHOLESTEROLEMIA 11/03/2006  . DEMENTIA, NOT SPECIFIED 11/03/2006  . CATARACT 11/03/2006  . HEARING LOSS NOS OR DEAFNESS 11/03/2006  . HYPERTENSION, BENIGN SYSTEMIC 11/03/2006  . CVA 11/03/2006  . GASTROESOPHAGEAL REFLUX, NO ESOPHAGITIS 11/03/2006  . Convulsions/seizures (HCC) 11/03/2006  . INCONTINENCE, URGE 11/03/2006    Past Medical History: Past Medical History  Diagnosis Date  . Hypertension   . Dementia   . Hyperlipidemia   . Seizures (HCC)   . Metatarsal bone fracture     Right - base of 5th  . DVT (deep venous thrombosis) (HCC) 02/22/2013    02/18/2013: Left lower extremity (femoral and popliteal) subacute Treated with coumadin x 6-7 months    . Stroke (HCC)   . Trigeminal neuralgia 11/03/2006    Qualifier: Diagnosis of  By: Abundio Miu    . CATARACT 11/03/2006    Qualifier: Diagnosis of  By: Abundio Miu    . HEARING LOSS NOS OR DEAFNESS 11/03/2006    Qualifier: Diagnosis of  By: Abundio Miu    . INCONTINENCE, URGE 11/03/2006    Qualifier: Diagnosis of  By: Abundio Miu    . Gangrene (HCC)   . Atrial fibrillation (HCC)   . Shortness of breath dyspnea     Past Surgical History: Past Surgical  History  Procedure Laterality Date  . Toe surgery      right 5th toe amputation  . Amputation Right 10/26/2013    Procedure: AMPUTATION BELOW KNEE;  Surgeon: Nadara Mustard, MD;  Location: [facility] OR;  Service: Orthopedics;  Laterality: Right;  Right Below Knee Amputation  . Abdominal aortagram N/A 09/13/2013    Procedure: ABDOMINAL Ronny Flurry;  Surgeon: Fransisco Hertz, MD;  Location: [facility] CATH LAB;  Service: Cardiovascular;  Laterality: N/A;  . Below knee leg amputation Right 10/2013    Social History: Social History  Substance Use Topics  . Smoking status: Never Smoker   . Smokeless tobacco: Never Used  . Alcohol Use: No   Additional social history: never smoker, used to drink alcohol frequently, none in last several years.   Please also refer to relevant sections of EMR.  Family History: Family History  Problem Relation Age of Onset  . Hypertension Mother   . Arthritis Father   . Heart disease Daughter     Heart Disease before age 29  . Hyperlipidemia Daughter   . Hypertension Daughter  Allergies and Medications: Allergies  Allergen Reactions  . Lisinopril Other (See Comments)    Possible Angioedema on 11/16/13 admission.   No current facility-administered medications on file prior to encounter.   Current Outpatient Prescriptions on File Prior to Encounter  Medication Sig Dispense Refill  . amiodarone (PACERONE) 200 MG tablet TAKE 1 TABLET BY MOUTH TWICE DAILY 60 tablet 6  . amLODipine (NORVASC) 10 MG tablet TAKE 1 TABLET BY MOUTH DAILY. 30 tablet 2  . carvedilol (COREG) 3.125 MG tablet TAKE 1 TABLET BY MOUTH TWICE DAILY WITH A MEAL 60 tablet 5  . levETIRAcetam (KEPPRA) 500 MG tablet TAKE 1 TABLET BY MOUTH 2 TIMES DAILY. 60 tablet 3  . loratadine (CLARITIN) 5 MG/5ML syrup Take 10 mLs (10 mg total) by mouth daily as needed for itching. 120 mL 2  . memantine (NAMENDA) 10 MG tablet Take 1 tablet (10 mg total) by mouth 2 (two) times daily. 180 tablet 1  . polyethylene glycol  (MIRALAX / GLYCOLAX) packet Take 17 g by mouth daily as needed for moderate constipation.    . triamcinolone cream (KENALOG) 0.1 % Apply 1 application topically 2 (two) times daily. 30 g 0  . XARELTO 15 MG TABS tablet TAKE 1 TABLET BY MOUTH DAILY 30 tablet 6    Objective: BP 102/83 mmHg  Pulse 86  Temp(Src) 99.3 F (37.4 C) (Oral)  Resp 18  SpO2 98% Exam: General: Lying in bed in NAD initially. Chronically ill appearing. Very tearful with palpation of either lower extremity. Eyes: Conjunctivae non-injected.  ENTM: Tacky mucous membranes. Oropharynx clear. No nasal discharge.  Neck: Supple, no LAD Cardiovascular: RRR. No murmurs, rubs, or gallops noted. trace pitting edema of left LE. Respiratory: No increased WOB. CTAB without wheezing, rhonchi, or crackles noted. Abdomen: +BS, soft, non-distended, non-tender.  MSK: Atrophied. S/p Right BKA with well healed scar. Left LE cold to palpation from knee down. Nonpalpable DP pulse on the left. Eschar at the base of the 1st MTP on the left foot. Significantly tender with gentle palpation of both the R BKA and the L LE.  Skin: No rashes noted  Neuro: Alert. Not answering questions. Intermittently follows commands.     Labs and Imaging: CBC BMET   Recent Labs Lab 11/10/15 1220  WBC 10.9*  HGB 12.5  HCT 37.3  PLT 223    Recent Labs Lab 11/10/15 1220  NA 140  K 4.0  CL 103  CO2 26  BUN 21*  CREATININE 1.21*  GLUCOSE 119*  CALCIUM 8.7*     Urinalysis    Component Value Date/Time   COLORURINE ORANGE* 11/10/2015 1426   APPEARANCEUR CLOUDY* 11/10/2015 1426   LABSPEC 1.028 11/10/2015 1426   PHURINE 5.5 11/10/2015 1426   GLUCOSEU NEGATIVE 11/10/2015 1426   HGBUR LARGE* 11/10/2015 1426   BILIRUBINUR MODERATE* 11/10/2015 1426   BILIRUBINUR NEG 09/22/2012 1104   KETONESUR 15* 11/10/2015 1426   PROTEINUR 100* 11/10/2015 1426   PROTEINUR 30 09/22/2012 1104   UROBILINOGEN 1.0 12/30/2014 0010   UROBILINOGEN 1.0 09/22/2012 1104    NITRITE POSITIVE* 11/10/2015 1426   NITRITE POSITIVE 09/22/2012 1104   LEUKOCYTESUR SMALL* 11/10/2015 1426   Lactic acid 1.7  EKG: Atrial fibrillation, HR 84. No significant change from previous  Joanna Puff, MD 11/10/2015, 4:16 PM PGY-2, [facility] Family Medicine FPTS Intern pager: [phone], text pages welcome

## 2015-11-10 NOTE — ED Notes (Signed)
Pt has 3-4 day hx of left lower leg pain and cold left foot pt alread rt aka also has not been eating or d=rinking well this last few  days also

## 2015-11-10 NOTE — Progress Notes (Addendum)
ANTICOAGULATION CONSULT NOTE - Initial Consult  Pharmacy Consult for Heparin  Indication: atrial fibrillation & DVT  Allergies  Allergen Reactions  . Lisinopril Other (See Comments)    Possible Angioedema on 11/16/13 admission.    Patient Measurements: Weight: 132 lb 3.2 oz (59.966 kg) Heparin Dosing Weight: 60 kg  Vital Signs: Temp: 100 F (37.8 C) (03/06 1748) Temp Source: Oral (03/06 1748) BP: 117/67 mmHg (03/06 1748) Pulse Rate: 81 (03/06 1748)  Labs:  Recent Labs  11/10/15 1220  HGB 12.5  HCT 37.3  PLT 223  CREATININE 1.21*    Estimated Creatinine Clearance: 28.3 mL/min (by C-G formula based on Cr of 1.21).   Medical History: Past Medical History  Diagnosis Date  . Hypertension   . Dementia   . Hyperlipidemia   . Seizures (HCC)   . Metatarsal bone fracture     Right - base of 5th  . DVT (deep venous thrombosis) (HCC) 02/22/2013    02/18/2013: Left lower extremity (femoral and popliteal) subacute Treated with coumadin x 6-7 months    . Stroke (HCC)   . Trigeminal neuralgia 11/03/2006    Qualifier: Diagnosis of  By: Abundio Miu    . CATARACT 11/03/2006    Qualifier: Diagnosis of  By: Abundio Miu    . HEARING LOSS NOS OR DEAFNESS 11/03/2006    Qualifier: Diagnosis of  By: Abundio Miu    . INCONTINENCE, URGE 11/03/2006    Qualifier: Diagnosis of  By: Abundio Miu    . Gangrene (HCC)   . Atrial fibrillation (HCC)   . Shortness of breath dyspnea     Assessment: Ann [name] is a 82 y.o. female admitted on 11/10/2015 with an ischemic left leg related to severe arterial disease. Vascular surgery has been consulted and have recommended an AKA however the patient was on Xarelto PTA for Afib/DVT and will require a washout period. Pharmacy has been consulted to transition the patient to heparin for anticoagulation while awaiting AKA on Friday, 3/10. The patient's last dose is noted to be this AM at 0800 - will wait 24 hours prior to starting the  heparin drip.  The patient's heparin level will likely be falsely elevated in the setting of recent Xarelto. Will obtain a baseline HL and aPTT with AM labs to get a better idea of what labs to use for heparin monitoring moving forward.  Goal of Therapy:  Heparin level 0.3-0.7 units/ml aPTT 66-102 seconds Monitor platelets by anticoagulation protocol: Yes   Plan:  1. Start heparin at 800 units/hr (8 ml/hr) starting at 0800 on 2/7 2. Obtain baseline HL/aPTT with AM labs on 3/7 3. Will continue to monitor for any signs/symptoms of bleeding and will follow up with heparin level and aPTT 8 hours after heparin is initiated on 3/7.  Georgina Pillion, PharmD, BCPS Clinical Pharmacist Pager: [phone] 11/10/2015 6:15 PM

## 2015-11-10 NOTE — Consult Note (Signed)
[facility] NOTE   MRN : [id number]  Reason for Consult: Ischemic left LE Referring Physician: ED  History of Present Illness: This patient comes in with a reported increased pain on the left LE.  No openings in the skin per family reports to ED staff.  She was last seen in our office by Dr. Imogene Burn.  At that time she had sever arterial disease bilaterally.  The aortogram is below that shows bilaterally occluded popliteals and below.  She refused a recommended right BKA at that time 10/2013.  She then had a right BKA by Dr. Lajoyce Corners 10/26/2013.  This has healed well.       No current facility-administered medications for this encounter.   Current Outpatient Prescriptions  Medication Sig Dispense Refill  . amiodarone (PACERONE) 200 MG tablet TAKE 1 TABLET BY MOUTH TWICE DAILY 60 tablet 6  . amLODipine (NORVASC) 10 MG tablet TAKE 1 TABLET BY MOUTH DAILY. 30 tablet 2  . carvedilol (COREG) 3.125 MG tablet TAKE 1 TABLET BY MOUTH TWICE DAILY WITH A MEAL 60 tablet 5  . levETIRAcetam (KEPPRA) 500 MG tablet TAKE 1 TABLET BY MOUTH 2 TIMES DAILY. 60 tablet 3  . loratadine (CLARITIN) 5 MG/5ML syrup Take 10 mLs (10 mg total) by mouth daily as needed for itching. 120 mL 2  . memantine (NAMENDA) 10 MG tablet Take 1 tablet (10 mg total) by mouth 2 (two) times daily. 180 tablet 1  . polyethylene glycol (MIRALAX / GLYCOLAX) packet Take 17 g by mouth daily as needed for moderate constipation.    . triamcinolone cream (KENALOG) 0.1 % Apply 1 application topically 2 (two) times daily. 30 g 0  . XARELTO 15 MG TABS tablet TAKE 1 TABLET BY MOUTH DAILY 30 tablet 6    Pt meds include: Statin :No Betablocker: Yes ASA: No Other anticoagulants/antiplatelets: Xarelto  Past Medical History  Diagnosis Date  . Hypertension   . Dementia   . Hyperlipidemia   . Seizures (HCC)   . Metatarsal bone fracture     Right - base of 5th  . DVT (deep venous thrombosis) (HCC) 02/22/2013   02/18/2013: Left lower extremity (femoral and popliteal) subacute Treated with coumadin x 6-7 months    . Stroke (HCC)   . Trigeminal neuralgia 11/03/2006    Qualifier: Diagnosis of  By: Abundio Miu    . CATARACT 11/03/2006    Qualifier: Diagnosis of  By: Abundio Miu    . HEARING LOSS NOS OR DEAFNESS 11/03/2006    Qualifier: Diagnosis of  By: Abundio Miu    . INCONTINENCE, URGE 11/03/2006    Qualifier: Diagnosis of  By: Abundio Miu    . Gangrene (HCC)   . Atrial fibrillation (HCC)   . Shortness of breath dyspnea     Past Surgical History  Procedure Laterality Date  . Toe surgery      right 5th toe amputation  . Amputation Right 10/26/2013    Procedure: AMPUTATION BELOW KNEE;  Surgeon: Nadara Mustard, MD;  Location: [facility] OR;  Service: Orthopedics;  Laterality: Right;  Right Below Knee Amputation  . Abdominal aortagram N/A 09/13/2013    Procedure: ABDOMINAL Ronny Flurry;  Surgeon: Fransisco Hertz, MD;  Location: [facility] CATH LAB;  Service: Cardiovascular;  Laterality: N/A;  . Below knee leg amputation Right 10/2013    Social History Social History  Substance Use Topics  . Smoking status: Never Smoker   . Smokeless tobacco: Never Used  . Alcohol Use:  No    Family History Family History  Problem Relation Age of Onset  . Hypertension Mother   . Arthritis Father   . Heart disease Daughter     Heart Disease before age 43  . Hyperlipidemia Daughter   . Hypertension Daughter     Allergies  Allergen Reactions  . Lisinopril Other (See Comments)    Possible Angioedema on 11/16/13 admission.     REVIEW OF SYSTEMS unable to obtain  General:  Weight loss,  Fever,  chills Neurologic:  Dizziness,  Blackouts,  Seizure  Stroke,  "Mini stroke",  Slurred speech,  Temporary blindness;  weakness in arms or legs,  Hoarseness  Dysphagia Cardiac:  Chest pain/pressure,  Shortness of breath at rest  Shortness of breath with exertion,   Atrial fibrillation or irregular heartbeat  Vascular:  Pain in legs with walking,  Pain in legs at rest,  Pain in legs at night,   Non-healing ulcer,  Blood clot in vein/DVT,   Pulmonary:  Home oxygen,  Productive cough,  Coughing up blood,  Asthma,   Wheezing  COPD Musculoskeletal:   Arthritis,  Low back pain,  Joint pain Hematologic:  Easy Bruising,  Anemia;  Hepatitis Gastrointestinal:  Blood in stool,  Gastroesophageal Reflux/heartburn, Urinary:  chronic Kidney disease,  on HD -  MWF or  TTHS,  Burning with urination,  Difficulty urinating Skin:  Rashes,  Wounds Psychological:  Anxiety,  Depression  Physical Examination Filed Vitals:   11/10/15 1145 11/10/15 1200 11/10/15 1430  BP: 115/78 100/77 113/90  Pulse: 79 88   Temp: 99.9 F (37.7 C)    TempSrc: Oral    Resp: SpO2: 93% 97%    There is no weight on file to calculate BMI.  General:   NAD HENT: WNL Eyes: Pupils equal Pulmonary: normal non-labored breathing , without Rales, rhonchi,  wheezing Cardiac: irregularly irregular, without  Murmurs, rubs or gallops; No carotid bruits Abdomen: soft, NT, no masses Skin: no rashes, ulcers noted;  no Gangrene , no cellulitis; no open wounds;   Vascular Exam/Pulses:Palpable bilateral femoral pulses, right BKA stump well healed, Left LE from the knee down cool to touch.   Musculoskeletal:  no edema  Neurologic: reacts to touch on the left foot with cries of pain  SENSATION:painful to left foot     Significant Diagnostic Studies: CBC Lab Results  Component Value Date   WBC 10.9* 11/10/2015   HGB 12.5 11/10/2015   HCT 37.3 11/10/2015   MCV 79.7 11/10/2015   PLT 223 11/10/2015    BMET    Component Value Date/Time   NA 140 11/10/2015 1220   K 4.0 11/10/2015 1220   CL 103 11/10/2015 1220   CO2 26 11/10/2015 1220   GLUCOSE 119* 11/10/2015 1220   BUN 21* 11/10/2015 1220    CREATININE 1.21* 11/10/2015 1220   CREATININE 1.11* 02/28/2013 1652   CALCIUM 8.7* 11/10/2015 1220   GFRNONAA 40* 11/10/2015 1220   GFRAA 46* 11/10/2015 1220   CrCl cannot be calculated (Unknown ideal weight.).  COAG Lab Results  Component Value Date   INR 2.37* 12/29/2014   INR 1.72* 10/29/2013   INR 1.36 10/28/2013     Non-Invasive Vascular Imaging:  FINDING(S):  Aorta: patent, suggestion of ectatic suprarenal segment  Superior mesenteric artery: patent  Celiac artery: patent   Right Left  RA patent patent  CIA patent patent  EIA patent patent  IIA patent patent  CFA patent patent  SFA patent patent  PFA patent patent  Pop Patent proximally, occluded distally Patent proximally, occluded distally  Trif Occluded Occluded  AT Occluded Occluded  Pero Occluded Occluded  PT Occluded Occluded  Bilateral ankle demonstrate only reconstituted collateral arteries.       ASSESSMENT/PLAN:  Chronic ischemic changes to left LE Sever arterial disease with occludes popliteal, and tibials left LE. There are no revascularization options.  We recommend BKA verses AKA     Thomasena Edis, EMMA [facility] 11/10/2015 2:47 PM  History and exam findings as above.  Has femoral pulse.  Pt not a revascularization candidate.  Offered her left AKA tomorrow by my partner Dr Imogene Burn who has previously seen her.  Pt family would prefer to have Dr Lajoyce Corners do her amputation.  I spoke with Dr Lajoyce Corners directly and he has tentattively scheduled this for Friday.  Pt is on NOAC and will need this held.  Will need admission to hospitalist service for pain control.  Will sign off.  Call if questions.  Fabienne Bruns, MD [facility] [place] Office: [phone] Pager: [phone]

## 2015-11-11 ENCOUNTER — Encounter (HOSPITAL_COMMUNITY)
Admission: EM | Disposition: A | Discharge status: Hospice | Payer: Self-pay | Source: Home / Self Care | Attending: Family Medicine

## 2015-11-11 ENCOUNTER — Inpatient Hospital Stay (HOSPITAL_COMMUNITY): Payer: Medicare Other | Admitting: Certified Registered Nurse Anesthetist

## 2015-11-11 ENCOUNTER — Encounter (HOSPITAL_COMMUNITY): Payer: Self-pay | Admitting: Anesthesiology

## 2015-11-11 DIAGNOSIS — N390 Urinary tract infection, site not specified: Secondary | ICD-10-CM | POA: Diagnosis present

## 2015-11-11 DIAGNOSIS — L8990 Pressure ulcer of unspecified site, unspecified stage: Secondary | ICD-10-CM | POA: Diagnosis present

## 2015-11-11 DIAGNOSIS — F0280 Dementia in other diseases classified elsewhere without behavioral disturbance: Secondary | ICD-10-CM | POA: Diagnosis present

## 2015-11-11 DIAGNOSIS — G309 Alzheimer's disease, unspecified: Secondary | ICD-10-CM

## 2015-11-11 DIAGNOSIS — M79673 Pain in unspecified foot: Secondary | ICD-10-CM | POA: Diagnosis present

## 2015-11-11 HISTORY — PX: AMPUTATION: SHX166

## 2015-11-11 LAB — BASIC METABOLIC PANEL
Anion gap: 10 (ref 5–15)
BUN: 21 mg/dL — AB (ref 6–20)
CO2: 23 mmol/L (ref 22–32)
Calcium: 7.9 mg/dL — ABNORMAL LOW (ref 8.9–10.3)
Chloride: 109 mmol/L (ref 101–111)
Creatinine, Ser: 1.09 mg/dL — ABNORMAL HIGH (ref 0.44–1.00)
GFR calc Af Amer: 52 mL/min — ABNORMAL LOW (ref >60)
GFR, EST NON AFRICAN AMERICAN: 45 mL/min — AB (ref >60)
GLUCOSE: 89 mg/dL (ref 65–99)
POTASSIUM: 3.8 mmol/L (ref 3.5–5.1)
Sodium: 142 mmol/L (ref 135–145)

## 2015-11-11 LAB — CBC
HEMATOCRIT: 31.6 % — AB (ref 36.0–46.0)
Hemoglobin: 10.8 g/dL — ABNORMAL LOW (ref 12.0–15.0)
MCH: 27.4 pg (ref 26.0–34.0)
MCHC: 34.2 g/dL (ref 30.0–36.0)
MCV: 80.2 fL (ref 78.0–100.0)
Platelets: 207 10*3/uL (ref 150–400)
RBC: 3.94 MIL/uL (ref 3.87–5.11)
RDW: 19.1 % — AB (ref 11.5–15.5)
WBC: 11.8 10*3/uL — ABNORMAL HIGH (ref 4.0–10.5)

## 2015-11-11 LAB — TYPE AND SCREEN
ABO/RH(D): O POS
ANTIBODY SCREEN: NEGATIVE

## 2015-11-11 LAB — ABO/RH: ABO/RH(D): O POS

## 2015-11-11 LAB — APTT: aPTT: 45 seconds — ABNORMAL HIGH (ref 24–37)

## 2015-11-11 LAB — HEPARIN LEVEL (UNFRACTIONATED): Heparin Unfractionated: >2.2 IU/mL — ABNORMAL HIGH (ref 0.30–0.70)

## 2015-11-11 SURGERY — AMPUTATION, ABOVE KNEE
Anesthesia: General | Laterality: Left

## 2015-11-11 MED ORDER — EPHEDRINE SULFATE 50 MG/ML IJ SOLN
INTRAMUSCULAR | Status: AC
  Filled 2015-11-11: qty 1

## 2015-11-11 MED ORDER — ADULT MULTIVITAMIN W/MINERALS CH
1.0000 | ORAL_TABLET | Freq: Every day | ORAL | Status: DC
  Administered 2015-11-12 – 2015-11-19 (×4): 1 via ORAL
  Filled 2015-11-11 (×6): qty 1

## 2015-11-11 MED ORDER — PROPOFOL 10 MG/ML IV BOLUS
INTRAVENOUS | Status: DC | PRN
  Administered 2015-11-11: 100 mg via INTRAVENOUS

## 2015-11-11 MED ORDER — METOCLOPRAMIDE HCL 5 MG/ML IJ SOLN: 5.0000 mg | Freq: Three times a day (TID) | INTRAMUSCULAR | Status: DC | PRN

## 2015-11-11 MED ORDER — VECURONIUM BROMIDE 10 MG IV SOLR
INTRAVENOUS | Status: AC
  Filled 2015-11-11: qty 10

## 2015-11-11 MED ORDER — STERILE WATER FOR INJECTION IJ SOLN
INTRAMUSCULAR | Status: AC
  Filled 2015-11-11: qty 40

## 2015-11-11 MED ORDER — LACTATED RINGERS IV SOLN
INTRAVENOUS | Status: DC | PRN
  Administered 2015-11-11: 18:00:00 via INTRAVENOUS

## 2015-11-11 MED ORDER — CEFAZOLIN SODIUM-DEXTROSE 2-3 GM-% IV SOLR
2.0000 g | INTRAVENOUS | Status: AC
  Administered 2015-11-11: 2 g via INTRAVENOUS
  Filled 2015-11-11: qty 50

## 2015-11-11 MED ORDER — ACETAMINOPHEN 325 MG PO TABS: 650.0000 mg | ORAL_TABLET | Freq: Four times a day (QID) | ORAL | Status: DC | PRN

## 2015-11-11 MED ORDER — SUCCINYLCHOLINE CHLORIDE 20 MG/ML IJ SOLN
INTRAMUSCULAR | Status: AC
  Filled 2015-11-11: qty 1

## 2015-11-11 MED ORDER — PROMETHAZINE HCL 25 MG/ML IJ SOLN: 6.2500 mg | INTRAMUSCULAR | Status: DC | PRN

## 2015-11-11 MED ORDER — ONDANSETRON HCL 4 MG/2ML IJ SOLN
INTRAMUSCULAR | Status: AC
  Filled 2015-11-11: qty 10

## 2015-11-11 MED ORDER — METHOCARBAMOL 500 MG PO TABS: 500.0000 mg | ORAL_TABLET | Freq: Four times a day (QID) | ORAL | Status: DC | PRN

## 2015-11-11 MED ORDER — ONDANSETRON HCL 4 MG/2ML IJ SOLN: 4.0000 mg | Freq: Four times a day (QID) | INTRAMUSCULAR | Status: DC | PRN

## 2015-11-11 MED ORDER — FENTANYL CITRATE (PF) 100 MCG/2ML IJ SOLN: 25.0000 ug | INTRAMUSCULAR | Status: DC | PRN

## 2015-11-11 MED ORDER — ARTIFICIAL TEARS OP OINT
TOPICAL_OINTMENT | OPHTHALMIC | Status: AC
  Filled 2015-11-11: qty 10.5

## 2015-11-11 MED ORDER — LIDOCAINE HCL (CARDIAC) 20 MG/ML IV SOLN
INTRAVENOUS | Status: DC | PRN
  Administered 2015-11-11: 50 mg via INTRAVENOUS

## 2015-11-11 MED ORDER — CARVEDILOL 3.125 MG PO TABS
3.1250 mg | ORAL_TABLET | Freq: Two times a day (BID) | ORAL | Status: DC
  Administered 2015-11-12: 3.125 mg via ORAL
  Filled 2015-11-11 (×2): qty 1

## 2015-11-11 MED ORDER — METOCLOPRAMIDE HCL 5 MG PO TABS: 5.0000 mg | ORAL_TABLET | Freq: Three times a day (TID) | ORAL | Status: DC | PRN

## 2015-11-11 MED ORDER — ONDANSETRON HCL 4 MG/2ML IJ SOLN
INTRAMUSCULAR | Status: DC | PRN
  Administered 2015-11-11: 4 mg via INTRAVENOUS

## 2015-11-11 MED ORDER — SUCCINYLCHOLINE CHLORIDE 20 MG/ML IJ SOLN
INTRAMUSCULAR | Status: DC | PRN
  Administered 2015-11-11: 100 mg via INTRAVENOUS

## 2015-11-11 MED ORDER — ACETAMINOPHEN 650 MG RE SUPP
650.0000 mg | Freq: Four times a day (QID) | RECTAL | Status: DC | PRN
Start: 2015-11-11 — End: 2015-11-13
  Administered 2015-11-12 – 2015-11-13 (×2): 650 mg via RECTAL
  Filled 2015-11-11 (×2): qty 1

## 2015-11-11 MED ORDER — SODIUM CHLORIDE 0.9 % IJ SOLN
INTRAMUSCULAR | Status: AC
  Filled 2015-11-11: qty 30

## 2015-11-11 MED ORDER — PHENYLEPHRINE 40 MCG/ML (10ML) SYRINGE FOR IV PUSH (FOR BLOOD PRESSURE SUPPORT)
PREFILLED_SYRINGE | INTRAVENOUS | Status: AC
  Filled 2015-11-11: qty 30

## 2015-11-11 MED ORDER — SODIUM CHLORIDE 0.45 % IV SOLN: INTRAVENOUS | Status: DC

## 2015-11-11 MED ORDER — LIDOCAINE HCL (CARDIAC) 20 MG/ML IV SOLN
INTRAVENOUS | Status: AC
  Filled 2015-11-11: qty 15

## 2015-11-11 MED ORDER — PHENYLEPHRINE HCL 10 MG/ML IJ SOLN
INTRAMUSCULAR | Status: DC | PRN
  Administered 2015-11-11: 120 ug via INTRAVENOUS
  Administered 2015-11-11 (×3): 80 ug via INTRAVENOUS
  Administered 2015-11-11: 120 ug via INTRAVENOUS
  Administered 2015-11-11: 80 ug via INTRAVENOUS

## 2015-11-11 MED ORDER — FENTANYL CITRATE (PF) 250 MCG/5ML IJ SOLN
INTRAMUSCULAR | Status: AC
  Filled 2015-11-11: qty 5

## 2015-11-11 MED ORDER — METHOCARBAMOL 1000 MG/10ML IJ SOLN
500.0000 mg | Freq: Four times a day (QID) | INTRAVENOUS | Status: DC | PRN
  Administered 2015-11-14: 500 mg via INTRAVENOUS
  Filled 2015-11-11 (×3): qty 5

## 2015-11-11 MED ORDER — PROPOFOL 10 MG/ML IV BOLUS
INTRAVENOUS | Status: AC
  Filled 2015-11-11: qty 20

## 2015-11-11 MED ORDER — ONDANSETRON HCL 4 MG/2ML IJ SOLN
INTRAMUSCULAR | Status: AC
  Filled 2015-11-11: qty 2

## 2015-11-11 MED ORDER — SODIUM CHLORIDE 0.9 % IV SOLN: INTRAVENOUS | Status: DC

## 2015-11-11 MED ORDER — ONDANSETRON HCL 4 MG PO TABS: 4.0000 mg | ORAL_TABLET | Freq: Four times a day (QID) | ORAL | Status: DC | PRN

## 2015-11-11 SURGICAL SUPPLY — 41 items
BLADE SAW RECIP 87.9 MT (BLADE) ×3 IMPLANT
BNDG COHESIVE 6X5 TAN STRL LF (GAUZE/BANDAGES/DRESSINGS) ×6 IMPLANT
BNDG GAUZE ELAST 4 BULKY (GAUZE/BANDAGES/DRESSINGS) ×3 IMPLANT
COVER SURGICAL LIGHT HANDLE (MISCELLANEOUS) ×6 IMPLANT
DRAPE EXTREMITY T 121X128X90 (DRAPE) ×3 IMPLANT
DRAPE PROXIMA HALF (DRAPES) ×3 IMPLANT
DRAPE U-SHAPE 47X51 STRL (DRAPES) ×6 IMPLANT
DRSG ADAPTIC 3X8 NADH LF (GAUZE/BANDAGES/DRESSINGS) ×3 IMPLANT
DRSG PAD ABDOMINAL 8X10 ST (GAUZE/BANDAGES/DRESSINGS) ×6 IMPLANT
DURAPREP 26ML APPLICATOR (WOUND CARE) ×3 IMPLANT
ELECT CAUTERY BLADE 6.4 (BLADE) IMPLANT
ELECT REM PT RETURN 9FT ADLT (ELECTROSURGICAL) ×3
ELECTRODE REM PT RTRN 9FT ADLT (ELECTROSURGICAL) ×1 IMPLANT
GAUZE SPONGE 4X4 12PLY STRL (GAUZE/BANDAGES/DRESSINGS) ×3 IMPLANT
GLOVE BIO SURGEON STRL SZ8 (GLOVE) ×4 IMPLANT
GLOVE BIOGEL PI IND STRL 9 (GLOVE) ×1 IMPLANT
GLOVE BIOGEL PI INDICATOR 9 (GLOVE) ×2
GLOVE SURG ORTHO 9.0 STRL STRW (GLOVE) ×3 IMPLANT
GOWN SPEC L3 XXLG W/TWL (GOWN DISPOSABLE) ×2 IMPLANT
GOWN STRL REUS W/ TWL XL LVL3 (GOWN DISPOSABLE) ×2 IMPLANT
GOWN STRL REUS W/TWL XL LVL3 (GOWN DISPOSABLE) ×6
KIT BASIN OR (CUSTOM PROCEDURE TRAY) ×3 IMPLANT
KIT ROOM TURNOVER OR (KITS) ×3 IMPLANT
MANIFOLD NEPTUNE II (INSTRUMENTS) ×3 IMPLANT
NS IRRIG 1000ML POUR BTL (IV SOLUTION) ×3 IMPLANT
PACK GENERAL/GYN (CUSTOM PROCEDURE TRAY) ×3 IMPLANT
PAD ARMBOARD 7.5X6 YLW CONV (MISCELLANEOUS) ×3 IMPLANT
PREVENA INCISION MGT 90 150 (MISCELLANEOUS) ×2 IMPLANT
STAPLER VISISTAT 35W (STAPLE) IMPLANT
STOCKINETTE IMPERVIOUS LG (DRAPES) IMPLANT
SUT ETHILON 2 0 PSLX (SUTURE) ×7 IMPLANT
SUT PDS AB 1 CT  36 (SUTURE)
SUT PDS AB 1 CT 36 (SUTURE) IMPLANT
SUT SILK 2 0 (SUTURE) ×3
SUT SILK 2-0 18XBRD TIE 12 (SUTURE) ×1 IMPLANT
SUT VIC AB 1 CTX 27 (SUTURE) ×3 IMPLANT
SWAB COLLECTION DEVICE MRSA (MISCELLANEOUS) IMPLANT
TOWEL OR 17X24 6PK STRL BLUE (TOWEL DISPOSABLE) ×3 IMPLANT
TOWEL OR 17X26 10 PK STRL BLUE (TOWEL DISPOSABLE) ×3 IMPLANT
TUBING VACUUM 7/8 X6' (MISCELLANEOUS) ×2 IMPLANT
WATER STERILE IRR 1000ML POUR (IV SOLUTION) ×3 IMPLANT

## 2015-11-11 NOTE — Op Note (Signed)
11/10/2015 - 11/11/2015  6:53 PM  PATIENT:  Ann [name]    PRE-OPERATIVE DIAGNOSIS: Cold ischemic gangrenous left leg  POST-OPERATIVE DIAGNOSIS:  Same  PROCEDURE:  AMPUTATION ABOVE KNEE left Application of Prevena wound VAC.  SURGEON:  Nadara Mustard, MD  PHYSICIAN ASSISTANT:None ANESTHESIA:   General  PREOPERATIVE INDICATIONS:  [name] is a  82 y.o. female with a diagnosis of Infected Left Leg who failed conservative measures and elected for surgical management.    The risks benefits and alternatives were discussed with the patient preoperatively including but not limited to the risks of infection, bleeding, nerve injury, cardiopulmonary complications, the need for revision surgery, among others, and the patient was willing to proceed.  OPERATIVE IMPLANTS: None  OPERATIVE FINDINGS: Severe ischemia with minimal petechial bleeding at the amputation site  OPERATIVE PROCEDURE: Patient was brought to the operating room and underwent a general anesthetic. After adequate levels anesthesia obtained patient's left lower extremity was prepped using DuraPrep draped into a sterile field the leg was prepped and draped out of the field to the popliteal fossa with impervious stockinette. A timeout was called. A fishmouth incision was made through the mid femur. This was carried down to bone with electrocautery. The bone was transected with a reciprocating saw. The vascular bundle was clamped and the amputation was completed. The vascular bundle was suture ligated with 2-0 silk. There was massive clot within the vascular bundle. The deep and superficial fascial layers and skin was closed using 2-0 nylon. A Prevena wound VAC was applied this had a good suction fit patient was extubated taken to the PACU in stable condition.

## 2015-11-11 NOTE — Progress Notes (Signed)
Utilization review completed. Darnesha Diloreto, RN, BSN. 

## 2015-11-11 NOTE — Progress Notes (Signed)
Initial Nutrition Assessment  INTERVENTION:  -Monitor for diet advancement per MD, provide Ensure Enlive BID, 350 kcal, 20 grams of protein per serving.  -If pt remains NPO, consider initiating nutrition support. Recommend initiating Jevity 1.2 @ 20 ml/h increasing by 10 ml until goal of 50 ml/h is met. Provide 30 ml Prostat daily 100 kcal, 15 grams of protein.   TF regimen + Prostat would provide 1540 kcal, 82 grams of protein, 968 ml of water.  -Provide Multivitamin with minerals daily.   NUTRITION DIAGNOSIS:   Increased nutrient needs related to wound healing as evidenced by estimated needs.  GOAL:   Patient will meet greater than or equal to 90% of their needs  MONITOR:   Diet advancement, Weight trends, Labs  REASON FOR ASSESSMENT:   Low Braden  ASSESSMENT:   Pt presenting with cold, painful left LE, confusion, and concerns for UTI . PMH is significant for PAD s/p R BKA, dementia, DVT, PAF, HTN, and seizures  Pt seen for low Braden. Pt was asleep during time of visit, attempted to wake her up but she was unresponsive. No family at bedside to obtain nutrition history. Per chart pt has a 31 lb (20%) weight loss within 8 months, which is severe for timeframe; however unable to conduct nutrition focused physical exam due to pt's sleeping position. Will follow up to complete nutrition assessment to assess if pt meets criteria for severe malnutrition.  Labs reviewed. Medications reviewed.   Diet Order:  Diet NPO time specified  Skin:  Wound (see comment) (Stage II PU on sacrum)  Last BM:  PTA  Height:   Ht Readings from Last 1 Encounters:  11/11/15 _0  (1.549 m)    Weight:   Wt Readings from Last 1 Encounters:  11/11/15 132 lb 6.4 oz (60.056 kg)    Ideal Body Weight:  44.5 kg (-6.5% for R BKA )  BMI:  Body mass index is 25.03 kg/(m^2).  Estimated Nutritional Needs:   Kcal:  1400-1600   Protein:  80-95 grams (1.3g/kg)   Fluid:  1.4-1.6 L   EDUCATION  NEEDS:   No education needs identified at this time  [place], Dietetic Intern Pager: [phone]

## 2015-11-11 NOTE — Progress Notes (Signed)
Family Medicine Teaching Service Daily Progress Note Intern Pager: [phone]  Patient name: Ann [name] Medical record number: [id number] Date of birth: 09/06/1928 Age: 82 y.o. Gender: female  Primary Care Provider: Caryl Ada, DO Consultants: Ann [name], Ann [name] Code Status: Full  Pt Overview and Major Events to Date:  Admitted 11/10/15 with dry gangrene left leg L AKA planned for 11/11/15  Assessment and Plan: EDELIN [name] is a 82 y.o. female presenting with cold, painful left LE, confusion, and concerns for UTI . PMH is significant for Ann [name] s/p R BKA, dementia, Ann [name], Ann [name], HTN, and seizures. She did have fevers overnight but other VS remained stable.   Dry gangrene in setting of Ann [name]: patient with a h/o of R BKA with Dr. Lajoyce [name] on 10/26/13, now with cold, painful left lower extremity. An aortogram in 10/2013 revealed occlusion of the popliteal and below on the right as well. Given this, Ann [name] saw the patient and noted she was not a revascularization candidate.Patient to have AKA by Dr. Lajoyce [name] this evening.  - Started Lipitor, unsure why this was discontinued previously.  - Holding xarelto and started heparin gtt - Patient is in no pain unless her leg is touched- will hold off on pain control given surgery today and fact patient is more somnolent recently.   Urinary tract infection: Urinalysis consistent with UTI with nitrite and leukocyte esterase. Daughter notes that she's been wanting salve on her vulva the last week due to burning with urination. No evidence of yeast infection on external inspection, did not perform full pelvic exam due to limited mobility and pain. This may be contributing to confusion. No lower back pain.  - continue ceftriaxone  Confusion: concern from family that the patient was more somnolent and less conversant over the last week. Neuro exam non-focal. U/A appears consistent with UTI with nitrite and leukocyte esterase. May also be a component of dehydration given poor PO  intake recently. - treat UTI with ceftriaxone x 3 days - hydrate with NS @ 100cc/hr - No head CT done given non-focal exam. If worsening status or focal findings develop, obtain CT head  - NPO for now  Dementia: High risk for delirium -continue home Namenda  Paroxysmal atrial fibrillation: currently in NSR.  - continue home amiodarone  - hold Xareltro, transition to heparin gtt in preparation for surgery.   Constipation: Reported for approximately 2 weeks. This could be the etiology of the patient's poor PO intake. Abdominal exam unremarkable.  - increase MiraLax to BID - if no improvement, consider abdominal x-ray.   HTN: Slightly low this morning at 98/57, concerning for dehydration. - hold home amlodipine - continue home coreg   Seizures: No recent seizure like activity. - continue Keppra  FEN/GI: NPO for surgery, NS @ 100cc/hr  Prophylaxis: heparin gtt for afib   Disposition: admitted to inpatient, attending Dr. Jennette [name]  Subjective:  Patient had a fever to 101.6 overnight. She expresses pain on exam but is otherwise unable to communicate.   Objective: Temp:  [99 F (37.2 C)-101.6 F (38.7 C)] 99.1 F (37.3 C) (03/07 0538) Pulse Rate:  [79-90] 90 (03/07 0112) Resp:  [16-23] 20 (03/07 0112) BP: (96-117)/(62-90) 96/62 mmHg (03/07 0112) SpO2:  [93 %-100 %] 100 % (03/07 0112) Weight:  [132 lb 3.2 oz (59.966 kg)-132 lb 6.4 oz (60.056 kg)] 132 lb 6.4 oz (60.056 kg) (03/07 0538) Physical Exam: General: [name], curled up in bed, difficult to arouse  Cardiovascular: Irregularly irregular.  Chest: Lungs CTAB. No increased  WOB.  Abdomen: +BS, S, moaned with abdominal palpation, ND Extremities: Left LE acutely tender to palpation below knee, cold to touch with no palpable DP pulses. 3x2cm open area at left lateral leg. Faint radial pulses.  Neuro: Responds to pain and withdrawal of bed sheets. Verbalized "no pain, no cold."  Laboratory:  Recent Labs Lab  11/10/15 1220 11/11/15 0528  WBC 10.9* 11.8*  HGB 12.5 10.8*  HCT 37.3 31.6*  PLT 223 207    Recent Labs Lab 11/10/15 1220 11/11/15 0528  NA 140 142  K 4.0 3.8  CL 103 109  CO2 26 23  BUN 21* 21*  CREATININE 1.21* 1.09*  CALCIUM 8.7* 7.9*  PROT 7.4  --   BILITOT 3.3*  --   ALKPHOS 83  --   ALT 34  --   AST 117*  --   GLUCOSE 119* 89    Imaging/Diagnostic Tests: No results found.  Ann Burkitt, MD 11/11/2015, 7:30 AM PGY-1, [facility] Family Medicine FPTS Intern pager: [phone], text pages welcome

## 2015-11-11 NOTE — Anesthesia Postprocedure Evaluation (Signed)
Anesthesia Post Note  Patient: Ann [name]  Procedure(s) Performed: Procedure(s) (LRB): AMPUTATION ABOVE KNEE (Left)  Patient location during evaluation: PACU Anesthesia Type: General Level of consciousness: awake and alert and patient cooperative Pain management: pain level controlled Vital Signs Assessment: post-procedure vital signs reviewed and stable Respiratory status: spontaneous breathing and respiratory function stable Cardiovascular status: stable Anesthetic complications: no    Last Vitals:  Filed Vitals:   11/11/15 2037 11/11/15 2133  BP: 93/70 100/68  Pulse:    Temp:    Resp:  18    Last Pain:  Filed Vitals:   11/11/15 2134  PainSc: Asleep                 [place] Scheiderer S

## 2015-11-11 NOTE — Consult Note (Signed)
ORTHOPAEDIC CONSULTATION  REQUESTING PHYSICIAN: Nestor Ramp, MD  Chief Complaint: Dry gangrene left leg  HPI: Ann [name] is a 82 y.o. female who presents with complete occlusion of her arterial circulation distal to the popliteal fossa left lower extremity. Patient complains of a cold painful gangrenous left leg.  Past Medical History  Diagnosis Date  . Hypertension   . Dementia   . Hyperlipidemia   . Seizures (HCC)   . Metatarsal bone fracture     Right - base of 5th  . DVT (deep venous thrombosis) (HCC) 02/22/2013    02/18/2013: Left lower extremity (femoral and popliteal) subacute Treated with coumadin x 6-7 months    . Stroke (HCC)   . Trigeminal neuralgia 11/03/2006    Qualifier: Diagnosis of  By: Abundio Miu    . CATARACT 11/03/2006    Qualifier: Diagnosis of  By: Abundio Miu    . HEARING LOSS NOS OR DEAFNESS 11/03/2006    Qualifier: Diagnosis of  By: Abundio Miu    . INCONTINENCE, URGE 11/03/2006    Qualifier: Diagnosis of  By: Abundio Miu    . Gangrene (HCC)   . Atrial fibrillation (HCC)   . Shortness of breath dyspnea    Past Surgical History  Procedure Laterality Date  . Toe surgery      right 5th toe amputation  . Amputation Right 10/26/2013    Procedure: AMPUTATION BELOW KNEE;  Surgeon: Nadara Mustard, MD;  Location: [facility] OR;  Service: Orthopedics;  Laterality: Right;  Right Below Knee Amputation  . Abdominal aortagram N/A 09/13/2013    Procedure: ABDOMINAL Ronny Flurry;  Surgeon: Fransisco Hertz, MD;  Location: [facility] CATH LAB;  Service: Cardiovascular;  Laterality: N/A;  . Below knee leg amputation Right 10/2013   Social History   Social History  . Marital Status: Widowed    Spouse Name: N/A  . Number of Children: N/A  . Years of Education: N/A   Social History Main Topics  . Smoking status: Never Smoker   . Smokeless tobacco: Never Used  . Alcohol Use: No  . Drug Use: No  . Sexual Activity: Not Asked   Other Topics Concern  . None    Social History Narrative   Family History  Problem Relation Age of Onset  . Hypertension Mother   . Arthritis Father   . Heart disease Daughter     Heart Disease before age 89  . Hyperlipidemia Daughter   . Hypertension Daughter    - negative except otherwise stated in the family history section Allergies  Allergen Reactions  . Lisinopril Other (See Comments)    Possible Angioedema on 11/16/13 admission.   Prior to Admission medications   Medication Sig Start Date End Date Taking? Authorizing Provider  amiodarone (PACERONE) 200 MG tablet TAKE 1 TABLET BY MOUTH TWICE DAILY 11/29/14  Yes Pincus Large, DO  amLODipine (NORVASC) 10 MG tablet TAKE 1 TABLET BY MOUTH DAILY. 08/18/15  Yes Pincus Large, DO  carvedilol (COREG) 3.125 MG tablet TAKE 1 TABLET BY MOUTH TWICE DAILY WITH A MEAL 12/09/14  Yes Pincus Large, DO  levETIRAcetam (KEPPRA) 500 MG tablet TAKE 1 TABLET BY MOUTH 2 TIMES DAILY. 06/26/15  Yes Pincus Large, DO  loratadine (CLARITIN) 5 MG/5ML syrup Take 10 mLs (10 mg total) by mouth daily as needed for itching. 05/15/15  Yes Pincus Large, DO  memantine (NAMENDA) 10 MG tablet Take 1 tablet (10 mg total) by mouth 2 (two)  times daily. 08/12/15  Yes Pincus Large, DO  polyethylene glycol (MIRALAX / GLYCOLAX) packet Take 17 g by mouth daily as needed for moderate constipation.   Yes Historical Provider, MD  triamcinolone cream (KENALOG) 0.1 % Apply 1 application topically 2 (two) times daily. 03/19/15  Yes Jamal Collin, MD  XARELTO 15 MG TABS tablet TAKE 1 TABLET BY MOUTH DAILY 10/31/15  Yes Pincus Large, DO  cephALEXin (KEFLEX) 500 MG capsule Take 1 capsule (500 mg total) by mouth 4 (four) times daily. 11/10/15   Marily Memos, MD   No results found. - pertinent xrays, CT, MRI studies were reviewed and independently interpreted  Positive ROS: All other systems have been reviewed and were otherwise negative with the exception of those mentioned in the HPI and as  above.  Physical Exam: General: Patient is lethargic Cardiovascular: No pedal edema Respiratory: No cyanosis, no use of accessory musculature GI: No organomegaly, abdomen is soft and non-tender Skin: Patient has ischemic ulcerations left lower extremity Neurologic: No protective sensation Psychiatric: Patient is confused and not alert. Lymphatic: No axillary or cervical lymphadenopathy  MUSCULOSKELETAL:  On examination patient's left lower extremity is cold ischemic from the knee distal. Her thigh is warm. She has a stable right transtibial amputation.  Assessment: Assessment: Cold gangrenous left lower extremity with complete occlusion of arterial circulation distal to the popliteal fossa.  Plan: Plan: Patient's only option is a above-the-knee amputation. Will have family consent for an above-the-knee amputation. Plan for surgery today at 5 PM. Nothing by mouth.  Thank you for the consult and the opportunity to see Ms. Ann Boss, MD [facility] [phone] 7:27 AM

## 2015-11-11 NOTE — Anesthesia Preprocedure Evaluation (Addendum)
Anesthesia Evaluation  Patient identified by MRN, date of birth, ID band Patient awake and Patient confused    Reviewed: Allergy & Precautions, NPO status , Patient's Chart, lab work & pertinent test results, reviewed documented beta blocker date and time   Airway Mallampati: II  TM Distance: >3 FB Neck ROM: Full    Dental   Pulmonary    breath sounds clear to auscultation       Cardiovascular hypertension, Pt. on medications and Pt. on home beta blockers + Peripheral Vascular Disease and +CHF   Rhythm:Regular Rate:Normal  12/2014 TTE: Low normal LV function; moderate LVH; restrictive LV filling; moderate biatrial enlargement; mildly reduced RV function; trace AI and MR; mild to moderate TR; mildly elevated pulmonary pressure.    Neuro/Psych Seizures -,  Dementia CVA    GI/Hepatic Neg liver ROS, GERD  ,  Endo/Other  negative endocrine ROS  Renal/GU negative Renal ROS     Musculoskeletal   Abdominal   Peds  Hematology negative hematology ROS (+)   Anesthesia Other Findings   Reproductive/Obstetrics                           Lab Results  Component Value Date   WBC 11.8* 11/11/2015   HGB 10.8* 11/11/2015   HCT 31.6* 11/11/2015   MCV 80.2 11/11/2015   PLT 207 11/11/2015   Lab Results  Component Value Date   CREATININE 1.09* 11/11/2015   BUN 21* 11/11/2015   NA 142 11/11/2015   K 3.8 11/11/2015   CL 109 11/11/2015   CO2 23 11/11/2015     Anesthesia Physical Anesthesia Plan  ASA: III  Anesthesia Plan: General   Post-op Pain Management:    Induction: Intravenous  Airway Management Planned: LMA and Oral ETT  Additional Equipment:   Intra-op Plan:   Post-operative Plan: Extubation in OR and Possible Post-op intubation/ventilation  Informed Consent: I have reviewed the patients History and Physical, chart, labs and discussed the procedure including the risks, benefits  and alternatives for the proposed anesthesia with the patient or authorized representative who has indicated his/her understanding and acceptance.     Plan Discussed with: CRNA  Anesthesia Plan Comments:        Anesthesia Quick Evaluation

## 2015-11-11 NOTE — Transfer of Care (Signed)
Immediate Anesthesia Transfer of Care Note  Patient: Ann [name]  Procedure(s) Performed: Procedure(s): AMPUTATION ABOVE KNEE (Left)  Patient Location: PACU  Anesthesia Type:General  Level of Consciousness: responds to stimulation  Airway & Oxygen Therapy: Patient Spontanous Breathing  Post-op Assessment: Report given to RN and Post -op Vital signs reviewed and stable  Post vital signs: Reviewed and stable  Last Vitals:  Filed Vitals:   11/11/15 1929 11/11/15 1954  BP:  87/52  Pulse: 75   Temp: 36.6 C 37.1 C  Resp: 20 20    Complications: No apparent anesthesia complications

## 2015-11-12 ENCOUNTER — Encounter (HOSPITAL_COMMUNITY): Payer: Self-pay | Admitting: Orthopedic Surgery

## 2015-11-12 LAB — BASIC METABOLIC PANEL
Anion gap: 11 (ref 5–15)
BUN: 19 mg/dL (ref 6–20)
CHLORIDE: 115 mmol/L — AB (ref 101–111)
CO2: 20 mmol/L — AB (ref 22–32)
Calcium: 7.8 mg/dL — ABNORMAL LOW (ref 8.9–10.3)
Creatinine, Ser: 1.03 mg/dL — ABNORMAL HIGH (ref 0.44–1.00)
GFR calc Af Amer: 56 mL/min — ABNORMAL LOW (ref >60)
GFR calc non Af Amer: 48 mL/min — ABNORMAL LOW (ref >60)
Glucose, Bld: 87 mg/dL (ref 65–99)
POTASSIUM: 4.2 mmol/L (ref 3.5–5.1)
SODIUM: 146 mmol/L — AB (ref 135–145)

## 2015-11-12 LAB — CBC
HEMATOCRIT: 29.9 % — AB (ref 36.0–46.0)
HEMOGLOBIN: 9.8 g/dL — AB (ref 12.0–15.0)
MCH: 26.5 pg (ref 26.0–34.0)
MCHC: 32.8 g/dL (ref 30.0–36.0)
MCV: 80.8 fL (ref 78.0–100.0)
Platelets: 197 10*3/uL (ref 150–400)
RBC: 3.7 MIL/uL — AB (ref 3.87–5.11)
RDW: 19.4 % — ABNORMAL HIGH (ref 11.5–15.5)
WBC: 9.9 10*3/uL (ref 4.0–10.5)

## 2015-11-12 MED ORDER — HEPARIN SODIUM (PORCINE) 5000 UNIT/ML IJ SOLN
5000.0000 [IU] | Freq: Three times a day (TID) | INTRAMUSCULAR | Status: AC
  Administered 2015-11-12 (×2): 5000 [IU] via SUBCUTANEOUS
  Filled 2015-11-12 (×2): qty 1

## 2015-11-12 MED ORDER — KETOROLAC TROMETHAMINE 30 MG/ML IJ SOLN
30.0000 mg | Freq: Once | INTRAMUSCULAR | Status: AC
  Administered 2015-11-12: 30 mg via INTRAVENOUS
  Filled 2015-11-12: qty 1

## 2015-11-12 MED ORDER — ENSURE ENLIVE PO LIQD
237.0000 mL | Freq: Two times a day (BID) | ORAL | Status: DC
  Administered 2015-11-17 – 2015-11-19 (×5): 237 mL via ORAL

## 2015-11-12 MED ORDER — SODIUM CHLORIDE 0.9 % IV BOLUS (SEPSIS)
500.0000 mL | Freq: Once | INTRAVENOUS | Status: AC
  Administered 2015-11-12: 500 mL via INTRAVENOUS

## 2015-11-12 MED ORDER — DEXTROSE-NACL 5-0.9 % IV SOLN
INTRAVENOUS | Status: DC
  Administered 2015-11-12 – 2015-11-14 (×5): via INTRAVENOUS
  Administered 2015-11-15: 1000 mL via INTRAVENOUS
  Administered 2015-11-15 – 2015-11-16 (×3): via INTRAVENOUS

## 2015-11-12 MED ORDER — SODIUM CHLORIDE 0.9 % IV SOLN
500.0000 mg | Freq: Two times a day (BID) | INTRAVENOUS | Status: DC
  Administered 2015-11-12 – 2015-11-18 (×12): 500 mg via INTRAVENOUS
  Filled 2015-11-12 (×16): qty 5

## 2015-11-12 MED ORDER — RIVAROXABAN 15 MG PO TABS
15.0000 mg | ORAL_TABLET | Freq: Every day | ORAL | Status: DC
  Administered 2015-11-14 – 2015-11-19 (×5): 15 mg via ORAL
  Filled 2015-11-12 (×7): qty 1

## 2015-11-12 NOTE — Evaluation (Signed)
Physical Therapy Evaluation Patient Details Name: Ann [name] MRN: [id number] DOB: [birth year] Today's Date: 11/12/2015   History of Present Illness  Ann [name] is a 82 y.o. female presenting with cold, painful left LE, confusion, and concerns for UTI . PMH is significant for PAD s/p R BKA, dementia, DVT, PAF, HTN, and seizures. Pt s/p L AKA 3/7 with wound vac placement.  Clinical Impression  Pt dependent for mobility at baseline but was able to propel self in w/c, feed self, and carry on a conversation. Pt now lethargic and dependent for all mobility. Pt with no command follow. Pt with noted skin breakdown on bottom as well and with L LE wound vac. Recommend SNF upon d/c for maximal functional recovery.    Follow Up Recommendations SNF;Supervision/Assistance - 24 hour    Equipment Recommendations  None recommended by PT    Recommendations for Other Services       Precautions / Restrictions Precautions Precautions: Fall (bilat LE amputee) Restrictions Weight Bearing Restrictions: No      Mobility  Bed Mobility Overal bed mobility: Needs Assistance;+2 for physical assistance Bed Mobility: Supine to Sit;Sit to Supine     Supine to sit: Max assist;+2 for physical assistance Sit to supine: Max assist;+2 for physical assistance   General bed mobility comments: pt with no initiation of task or command follow  Transfers                    Ambulation/Gait                Stairs            Wheelchair Mobility    Modified Rankin (Stroke Patients Only)       Balance Overall balance assessment: Needs assistance Sitting-balance support: Bilateral upper extremity supported Sitting balance-Leahy Scale: Poor Sitting balance - Comments: maxA to maintain EOB balance. RN attempted to give pt medications and pt pushed self backwards                                     Pertinent Vitals/Pain Pain Assessment: Faces Faces Pain Scale: Hurts  whole lot Pain Location: L LE, noted with mvmt, no sign of pain at rest Pain Intervention(s): Monitored during session    Home Living Family/patient expects to be discharged to:: Skilled nursing facility                 Additional Comments: pt was at home. per dtr [name], her and her nephew were manually lifting pt into w/c daily in which pt was pushing self around.    Prior Function Level of Independence: Needs assistance   Gait / Transfers Assistance Needed: per dtr pt transfered to w/c daily and was able to push self around  ADL's / Homemaking Assistance Needed: family assist with ADLs and IADLS but pt could feed self        Hand Dominance   Dominant Hand: Right    Extremity/Trunk Assessment   Upper Extremity Assessment: Generalized weakness           Lower Extremity Assessment: Generalized weakness (L AKA, R BKA, no active mvmt of either extremity)      Cervical / Trunk Assessment: Normal  Communication   Communication: HOH  Cognition Arousal/Alertness: Lethargic Behavior During Therapy: Flat affect Overall Cognitive Status: History of cognitive impairments - at baseline (pt with dementia)  General Comments General comments (skin integrity, edema, etc.): wound vac on L LE, pt with urinary incontinence and assist tech with log rolling pt to perform hygiene and change sheets    Exercises        Assessment/Plan    PT Assessment Patient needs continued PT services  PT Diagnosis Generalized weakness;Acute pain;Difficulty walking   PT Problem List Decreased strength;Decreased activity tolerance;Decreased balance;Decreased mobility;Decreased knowledge of use of DME  PT Treatment Interventions DME instruction;Functional mobility training;Therapeutic activities;Therapeutic exercise;Balance training;Wheelchair mobility training   PT Goals (Current goals can be found in the Care Plan section) Acute Rehab PT Goals Patient Stated  Goal: didn't report PT Goal Formulation: Patient unable to participate in goal setting Time For Goal Achievement: 11/19/15 Potential to Achieve Goals: Fair    Frequency Min 1X/week   Barriers to discharge        Co-evaluation               End of Session   Activity Tolerance: Patient limited by pain;Patient limited by lethargy Patient left: in bed;with call bell/phone within reach;with nursing/sitter in room;with bed alarm set Nurse Communication: Mobility status         Time: 1610-9604 PT Time Calculation (min) (ACUTE ONLY): 30 min   Charges:   PT Evaluation $PT Eval Moderate Complexity: 1 Procedure PT Treatments $Therapeutic Activity: 8-22 mins   PT G Codes:        Marcene Brawn 11/12/2015, 11:40 AM  Lewis Shock, PT, DPT Pager #: [phone] Office #: [phone]

## 2015-11-12 NOTE — Progress Notes (Signed)
Family Medicine Teaching Service Daily Progress Note Intern Pager: [phone]  Patient name: Ann [name] Medical record number: [id number] Date of birth: Feb 11, 1928 Age: 82 y.o. Gender: female  Primary Care Provider: Caryl Ada, DO Consultants: VVS, Ortho Code Status: Full  Pt Overview and Major Events to Date:  Admitted 11/10/15 with dry gangrene left leg L AKA 11/11/15  Assessment and Plan: Ann [name] is a 82 y.o. female presenting with cold, painful left LE, confusion, and concerns for UTI . PMH is significant for PAD s/p R BKA, dementia, DVT, PAF, HTN, and seizures. LKA performed yesterday; remained afebrile overnight.   Dry gangrene in setting of PAD, now s/p L AKA: patient with a h/o of R BKA with Dr. Lajoyce Corners on 10/26/13, now with cold, painful left lower extremity. An aortogram in 10/2013 revealed occlusion of the popliteal and below on the right as well. She failed conservative management and had L AKA 11/11/15.  - Continue Lipitor, unsure why this was discontinued previously.  - Patient is resting quietly unless her leg is touched - Tylenol 650 mg q6h prn for pain (none given) - Post op H/H 9.8/29.9, slightly down from 10.8/31.6; op note reports minimal blood loss - On 2L O2 by [place] for comfort  Urinary tract infection: Urinalysis consistent with UTI with nitrite and leukocyte esterase. Daughter notes that she's been wanting salve on her vulva the last week due to burning with urination. No evidence of yeast infection on external inspection, did not perform full pelvic exam due to limited mobility and pain. This may be contributing to confusion. No lower back pain.  - continue ceftriaxone  Confusion: concern from family that the patient was more somnolent and less conversant over the last week. Neuro exam non-focal. U/A appears consistent with UTI with nitrite and leukocyte esterase. May also be a component of dehydration given poor PO intake recently. - treat UTI with ceftriaxone x 3  days (Day 2 of 3) - hydrate with NS @ 100cc/hr - Appears to be at baseline  Dementia: High risk for delirium -continue home Namenda  Paroxysmal atrial fibrillation: currently in NSR.  - continue home amiodarone  - Restart Xareltro (confirmed with Ortho this a.m.)  Constipation: Had BM yesterday. Reported for approximately 2 weeks. This could be the etiology of the patient's poor PO intake. Abdominal exam unremarkable.  - increase MiraLax to BID  HTN: Slightly low this morning at 92/64 - Continue to hold home amlodipine - Continue home coreg   Seizures: No recent seizure like activity. - continue Keppra (made IV, as patient having trouble taking PO)  FEN/GI: Regular diet, NS @ 100cc/hr  Prophylaxis: xarelto  Disposition: admitted to inpatient, attending Dr. Jennette Kettle  Subjective:  Patient afebrile overnight, with improved leukocytosis. She expresses pain on exam but is otherwise unable to communicate.   Objective: Temp:  [97.9 F (36.6 C)-99.9 F (37.7 C)] 98.6 F (37 C) (03/08 0516) Pulse Rate:  [71-82] 71 (03/08 0516) Resp:  [16-22] 16 (03/08 0516) BP: (80-100)/(52-73) 92/64 mmHg (03/08 0516) SpO2:  [94 %-100 %] 100 % (03/08 0516) Weight:  [132 lb 6.4 oz (60.056 kg)-133 lb 10.6 oz (60.63 kg)] 133 lb 10.6 oz (60.63 kg) (03/08 0516) Physical Exam: General: [name], resting in bed, difficult to arouse  Cardiovascular: Irregularly irregular. Faint radial pulses.  Chest: Lungs CTAB. No increased WOB.  Abdomen: +BS, S, NT, ND Extremities: L AKA with wound vac in place. No drainage from wound vac. TTP at stump but no erythema.  Neuro: Responds to pain and withdrawal of bed sheets. Unable to verbalize. Faintly moaning.   Laboratory:  Recent Labs Lab 11/10/15 1220 11/11/15 0528 11/12/15 0520  WBC 10.9* 11.8* 9.9  HGB 12.5 10.8* 9.8*  HCT 37.3 31.6* 29.9*  PLT 223 207 197    Recent Labs Lab 11/10/15 1220 11/11/15 0528 11/12/15 0520  NA 140 142 146*   K 4.0 3.8 4.2  CL 103 109 115*  CO2 26 23 20*  BUN 21* 21* 19  CREATININE 1.21* 1.09* 1.03*  CALCIUM 8.7* 7.9* 7.8*  PROT 7.4  --   --   BILITOT 3.3*  --   --   ALKPHOS 83  --   --   ALT 34  --   --   AST 117*  --   --   GLUCOSE 119* 89 87    Imaging/Diagnostic Tests: No results found.  Casey Burkitt, MD 11/12/2015, 7:16 AM PGY-1, [facility] Family Medicine FPTS Intern pager: [phone], text pages welcome

## 2015-11-12 NOTE — Progress Notes (Signed)
Spoke with daughter who was at patient's bedside. She explained that at baseline her mother does have dementia but is oriented to self and place. Daughter did not try to feed her ensure that is at bedside because pt was sleeping. Grand-daughter is to visit this evening. Told daughter to encourage mom to eat while visiting.   Patient woke up more easily this afternoon, briefly opening her eyes when her arm was rubbed. She continues not to be interested in food.   Took sub-axillary temperature while in room, and reading was 102.4 F. Ordered one time dose of IV toradol 30 mg, as patient had recently received tylenol.

## 2015-11-12 NOTE — Progress Notes (Signed)
ANTICOAGULATION CONSULT NOTE  Pharmacy Consult for xarelto Indication: afib  Allergies  Allergen Reactions  . Lisinopril Other (See Comments)    Possible Angioedema on 11/16/13 admission.    Patient Measurements: Height: 5\' 1"  (154.9 cm) Weight: 133 lb 10.6 oz (60.63 kg) IBW/kg (Calculated) : 47.8 Heparin Dosing Weight: 60 kg  Vital Signs: Temp: 98.6 F (37 C) (03/08 0516) Temp Source: Oral (03/08 0516) BP: 92/64 mmHg (03/08 0516) Pulse Rate: 71 (03/08 0516)  Labs:  Recent Labs  11/10/15 1220 11/11/15 0528 11/12/15 0520  HGB 12.5 10.8* 9.8*  HCT 37.3 31.6* 29.9*  PLT 223 207 197  APTT  --  45*  --   HEPARINUNFRC  --  >2.20*  --   CREATININE 1.21* 1.09* 1.03*    Estimated Creatinine Clearance: 33.3 mL/min (by C-G formula based on Cr of 1.03).  Assessment: Ann [name] is a 82 y.o. Female on xarelto PTA for afib, admitted on 11/10/2015 with an ischemic left leg related to severe arterial disease, s/p AKA on 3/7. Now to resume xarelto. hgb 9.8, Pltc 197K. Renal function is at baseline, with est. crcl ~ 30 ml/min.     Goal of Therapy:  Monitor platelets by anticoagulation protocol: Yes   Plan:  Xarelto 15 mg po daily with evening meals If patient cannot swallow pills, xarelto can be crushed and give after mixing with 50 ml of water. D/C sq heparin after 1400 dose.   Bayard Hugger, PharmD, BCPS  Clinical Pharmacist  Pager: [phone]  11/12/2015 9:33 AM

## 2015-11-12 NOTE — Progress Notes (Signed)
ANTICOAGULATION CONSULT NOTE  Pharmacy Consult for Heparin  Indication: VTE prophylaxis  Allergies  Allergen Reactions  . Lisinopril Other (See Comments)    Possible Angioedema on 11/16/13 admission.    Patient Measurements: Height: 5\' 1"  (154.9 cm) Weight: 132 lb 6.4 oz (60.056 kg) IBW/kg (Calculated) : 47.8 Heparin Dosing Weight: 60 kg  Vital Signs: Temp: 98.3 F (36.8 C) (03/08 0026) Temp Source: Oral (03/08 0026) BP: 85/67 mmHg (03/08 0026) Pulse Rate: 73 (03/08 0026)  Labs:  Recent Labs  11/10/15 1220 11/11/15 0528  HGB 12.5 10.8*  HCT 37.3 31.6*  PLT 223 207  APTT  --  45*  HEPARINUNFRC  --  >2.20*  CREATININE 1.21* 1.09*    Estimated Creatinine Clearance: 31.4 mL/min (by C-G formula based on Cr of 1.09).  Assessment: Ann [name] is a 82 y.o. female admitted on 11/10/2015 with an ischemic left leg related to severe arterial disease. Vascular surgery has been consulted and have recommended an AKA however the patient was on Xarelto PTA for Afib and will require a washout period. (noted pt also with h/o DVT in 2014 - received 6 mos of coumadin for this).  Pharmacy initially consulted to transition the patient to heparin for anticoagulation while awaiting AKA. AKA originally planned for Friday 3/10. However, AKA done 3/7 p.m. so heparin gtt never started pre-op. Order to start heparin SQ for VTE prophylaxis dosing since post-op. Likely restart Xarelto tomorrow.  Goal of Therapy:  Monitor platelets by anticoagulation protocol: Yes   Plan:  Heparin SQ 5000 units q8h Will f/u restart of Xarelto tomorrow or transition back to full-dose heparin if needed  Christoper Fabian, PharmD, BCPS Clinical pharmacist, pager [phone] 11/12/2015 12:28 AM

## 2015-11-12 NOTE — Progress Notes (Signed)
HS meds (crushed in applesauce) attempted late during this shift due to anesthesia from AKA surgery this past evening. Pt also with dementia and unable to follow commands for PO intake. MD paged about switching meds to IV due to choking risk. Orders received for IV Keppra. Will continue to monitor.

## 2015-11-12 NOTE — Consult Note (Addendum)
WOC wound consult note Dr Lajoyce Corners of the ortho service is following for assessment and plan of care to left leg with Vac dressing.  Pt consult him for further questions regarding plan of care.  Reason for Consult: Consult requested for right buttock wound Wound type: Stage 2 pressure injury Pressure Ulcer POA: Yes Measurement: 3X1X.2cm Wound bed: beefy red and moist Drainage (amount, consistency, odor) Small amt tan drainage, no odor Periwound: Intact skin surrounding Dressing procedure/placement/frequency: Foam dressing to protect and promote healing. Please re-consult if further assistance is needed.  Thank-you,  Cammie Mcgee MSN, RN, CWOCN, [place], CNS [phone]

## 2015-11-12 NOTE — Progress Notes (Signed)
Patient ID: Ann [name], female   DOB: 1928/03/14, 82 y.o.   MRN: [id number] Postoperative day 1 left above-the-knee amputation for gangrene left lower extremity. Patient's white blood cell count has improved this morning. Patient is resting comfortably. There is no drainage and the wound VAC. Anticipate discharge to skilled nursing.

## 2015-11-13 DIAGNOSIS — L8990 Pressure ulcer of unspecified site, unspecified stage: Secondary | ICD-10-CM

## 2015-11-13 DIAGNOSIS — Z89612 Acquired absence of left leg above knee: Secondary | ICD-10-CM | POA: Diagnosis not present

## 2015-11-13 DIAGNOSIS — R410 Disorientation, unspecified: Secondary | ICD-10-CM | POA: Diagnosis present

## 2015-11-13 LAB — BASIC METABOLIC PANEL
Anion gap: 9 (ref 5–15)
BUN: 25 mg/dL — AB (ref 6–20)
CALCIUM: 7.8 mg/dL — AB (ref 8.9–10.3)
CHLORIDE: 118 mmol/L — AB (ref 101–111)
CO2: 17 mmol/L — AB (ref 22–32)
CREATININE: 1.3 mg/dL — AB (ref 0.44–1.00)
GFR calc Af Amer: 42 mL/min — ABNORMAL LOW (ref >60)
GFR calc non Af Amer: 36 mL/min — ABNORMAL LOW (ref >60)
Glucose, Bld: 206 mg/dL — ABNORMAL HIGH (ref 65–99)
Potassium: 3.9 mmol/L (ref 3.5–5.1)
SODIUM: 144 mmol/L (ref 135–145)

## 2015-11-13 LAB — CBC
HCT: 27 % — ABNORMAL LOW (ref 36.0–46.0)
HEMOGLOBIN: 9 g/dL — AB (ref 12.0–15.0)
MCH: 27.2 pg (ref 26.0–34.0)
MCHC: 33.3 g/dL (ref 30.0–36.0)
MCV: 81.6 fL (ref 78.0–100.0)
Platelets: 231 10*3/uL (ref 150–400)
RBC: 3.31 MIL/uL — ABNORMAL LOW (ref 3.87–5.11)
RDW: 19.5 % — AB (ref 11.5–15.5)
WBC: 12.3 10*3/uL — ABNORMAL HIGH (ref 4.0–10.5)

## 2015-11-13 LAB — GLUCOSE, CAPILLARY: GLUCOSE-CAPILLARY: 131 mg/dL — AB (ref 65–99)

## 2015-11-13 MED ORDER — SODIUM CHLORIDE 0.9 % IV BOLUS (SEPSIS)
500.0000 mL | Freq: Once | INTRAVENOUS | Status: AC
  Administered 2015-11-13: 500 mL via INTRAVENOUS

## 2015-11-13 MED ORDER — MORPHINE SULFATE (PF) 2 MG/ML IV SOLN
1.0000 mg | INTRAVENOUS | Status: DC | PRN
  Administered 2015-11-14 – 2015-11-17 (×2): 1 mg via INTRAVENOUS
  Filled 2015-11-13 (×2): qty 1

## 2015-11-13 MED ORDER — MORPHINE SULFATE (PF) 2 MG/ML IV SOLN
1.0000 mg | Freq: Four times a day (QID) | INTRAVENOUS | Status: DC
  Administered 2015-11-13 – 2015-11-17 (×6): 1 mg via INTRAVENOUS
  Filled 2015-11-13 (×7): qty 1

## 2015-11-13 MED ORDER — ACETAMINOPHEN 650 MG RE SUPP
650.0000 mg | Freq: Three times a day (TID) | RECTAL | Status: DC
  Administered 2015-11-13 – 2015-11-17 (×10): 650 mg via RECTAL
  Filled 2015-11-13 (×10): qty 1

## 2015-11-13 NOTE — Progress Notes (Signed)
Bladder scan done urine amount less than 100

## 2015-11-13 NOTE — Care Management Important Message (Signed)
Important Message  Patient Details  Name: Ann [name] MRN: [id number] Date of Birth: 11-09-1927   Medicare Important Message Given:  Yes    Kimberlin Scheel [place] 11/13/2015, 12:00 PM

## 2015-11-13 NOTE — Progress Notes (Signed)
Tried to wean off Oxygen this morning, pt's oxygen sat in RA was 78%, 2l via [place] started again, it is 95% now.

## 2015-11-13 NOTE — Progress Notes (Signed)
Patient ID: Ann [name], female   DOB: 10/04/1927, 82 y.o.   MRN: [id number] Postoperative day 2 above-the-knee amputation. There is minimal drainage and the wound VAC. Will leave the incisional wound VAC on for approximately 1 week.

## 2015-11-13 NOTE — Progress Notes (Signed)
Family Medicine Teaching Service Daily Progress Note Intern Pager: [phone]  Patient name: Ann [name] Medical record number: [id number] Date of birth: Sep 11, 1927 Age: 82 y.o. Gender: female  Primary Care Provider: Caryl Ada, DO Consultants: VVS, Ortho Code Status: Full  Pt Overview and Major Events to Date:  Admitted 11/10/15 with dry gangrene left leg L AKA 11/11/15  Assessment and Plan: Ann [name] is a 82 y.o. female who presented with cold, painful left LE, confusion, and concerns for UTI . PMH is significant for PAD s/p R BKA, dementia, DVT, PAF, HTN, and seizures. LKA performed 11/11/15. Patient had fevers yesterday afternoon/early evening. She was afebrile overnight. She continues not to take food. WBC increased to 12.3 from 9.9 yesterday, suspect post-op reactive leukocytosis. She is more alert this morning but will not answer questions.  Dry gangrene in setting of PAD, now s/p L AKA: patient with a h/o of R BKA with Dr. Lajoyce Corners on 10/26/13, presented with cold, painful left lower extremity. An aortogram in 10/2013 revealed occlusion of the popliteal and below on the right as well. She failed conservative management and had L AKA 11/11/15.  - Continue Lipitor - Patient is not indicating pain at AKA site - Schedule Tylenol 650 mg q8h  - Schedule IV morphine 1 mg q6h and prn 1 mg q3h ordered - Hgb/hct decreased to 9.0/27.0 from post op H/H 9.8/29.9, which is down from 10.8/31.6; op note reports minimal blood loss - On 2L O2 by [place] for comfort after surgery. Wean today.  - PT recommending SNF for discharge, daughter [name] in agreement  Urinary tract infection: Urinalysis consistent with UTI with nitrite and leukocyte esterase. This may be contributing to confusion. No lower back pain.  - continue ceftriaxone (Day 3 of 3)  Confusion: concern from family that the patient was more somnolent and less conversant over the 1-2 weeks. Neuro exam non-focal. U/A appears consistent with UTI with  nitrite and leukocyte esterase. May also be a component of dehydration given poor PO intake recently. Patient has been minimally verbal during hospitalization but was speaking to herself this morning.  - treat UTI with ceftriaxone x 3 days (Day 3 of 3) - hydrate with D5NS @ 100cc/hr - More alert today but still refusing PO - Obtain bladder scan - Up to chair  Dementia: High risk for delirium - continue home Namenda  Paroxysmal atrial fibrillation: currently in NSR.  - Continue home amiodarone  - Continue Xareltro  Constipation: Reported for approximately 2 weeks, though had BM 11/11/15. This could be a cause of patient's poor PO intake. Abdominal exam unremarkable.  - Increased MiraLax to BID  HTN: Soft this morning at 80/67 - Will give 500 cc NS bolus - Continue to hold home amlodipine - Continue home coreg   Seizures: No recent seizure like activity. - continue Keppra (made IV, as patient having trouble taking PO)  FEN/GI: Regular diet, D5NS @ 100cc/hr  Prophylaxis: xarelto  Disposition: admitted to inpatient, attending Dr. Jennette Kettle  Subjective:  Patient is calling out for her mother asking for help when covers removed today. She will not open her eyes. Spit out oatmeal for nurse when offered.    Objective: Temp:  [97.6 F (36.4 C)-100.4 F (38 C)] 98.1 F (36.7 C) (03/09 0515) Pulse Rate:  [66-85] 85 (03/09 0515) Resp:  [20] 20 (03/09 0515) BP: (80-100)/(65-68) 80/67 mmHg (03/09 0515) SpO2:  [93 %-95 %] 95 % (03/09 0515) Weight:  [140 lb (63.504 kg)] 140 lb (63.504  kg) (03/09 0515) Physical Exam: General: [name], sitting up in bed with eyes closed, moaning  Cardiovascular: RRR. No murmurs appreciated. Faint radial pulses.  Chest: Crackles appreciated over left lower lung field. No increased WOB. Mayo in place.  Abdomen: +BS, S, NT, ND Extremities: L AKA with wound vac in place. No drainage from wound vac. No TTP at stump or erythema.  Neuro: Responds to  withdrawal of bed sheets and cold stimuli. Calling out for mother to help her. Faintly moaning.   Laboratory:  Recent Labs Lab 11/11/15 0528 11/12/15 0520 11/13/15 0512  WBC 11.8* 9.9 12.3*  HGB 10.8* 9.8* 9.0*  HCT 31.6* 29.9* 27.0*  PLT 207 197 231    Recent Labs Lab 11/10/15 1220 11/11/15 0528 11/12/15 0520 11/13/15 0512  NA 140 142 146* 144  K 4.0 3.8 4.2 3.9  CL 103 109 115* 118*  CO2 26 23 20* 17*  BUN 21* 21* 19 25*  CREATININE 1.21* 1.09* 1.03* 1.30*  CALCIUM 8.7* 7.9* 7.8* 7.8*  PROT 7.4  --   --   --   BILITOT 3.3*  --   --   --   ALKPHOS 83  --   --   --   ALT 34  --   --   --   AST 117*  --   --   --   GLUCOSE 119* 89 87 206*    Imaging/Diagnostic Tests: No results found.  Casey Burkitt, MD 11/13/2015, 7:37 AM PGY-1, [facility] Family Medicine FPTS Intern pager: [phone], text pages welcome

## 2015-11-14 ENCOUNTER — Inpatient Hospital Stay (HOSPITAL_COMMUNITY): Payer: Medicare Other

## 2015-11-14 DIAGNOSIS — Z89619 Acquired absence of unspecified leg above knee: Secondary | ICD-10-CM | POA: Insufficient documentation

## 2015-11-14 DIAGNOSIS — Z515 Encounter for palliative care: Secondary | ICD-10-CM | POA: Insufficient documentation

## 2015-11-14 DIAGNOSIS — K5900 Constipation, unspecified: Secondary | ICD-10-CM

## 2015-11-14 LAB — CBC
HEMATOCRIT: 27.1 % — AB (ref 36.0–46.0)
HEMOGLOBIN: 8.7 g/dL — AB (ref 12.0–15.0)
MCH: 26.4 pg (ref 26.0–34.0)
MCHC: 32.1 g/dL (ref 30.0–36.0)
MCV: 82.1 fL (ref 78.0–100.0)
Platelets: 205 10*3/uL (ref 150–400)
RBC: 3.3 MIL/uL — AB (ref 3.87–5.11)
RDW: 20.5 % — ABNORMAL HIGH (ref 11.5–15.5)
WBC: 10.2 10*3/uL (ref 4.0–10.5)

## 2015-11-14 LAB — TSH: TSH: 0.561 u[IU]/mL (ref 0.350–4.500)

## 2015-11-14 LAB — CORTISOL-AM, BLOOD: CORTISOL - AM: 16.2 ug/dL (ref 6.7–22.6)

## 2015-11-14 LAB — GLUCOSE, CAPILLARY
GLUCOSE-CAPILLARY: 160 mg/dL — AB (ref 65–99)
GLUCOSE-CAPILLARY: 230 mg/dL — AB (ref 65–99)
Glucose-Capillary: 139 mg/dL — ABNORMAL HIGH (ref 65–99)

## 2015-11-14 LAB — AMMONIA: Ammonia: 44 umol/L — ABNORMAL HIGH (ref 9–35)

## 2015-11-14 MED ORDER — METHYLPREDNISOLONE SODIUM SUCC 40 MG IJ SOLR
30.0000 mg | Freq: Two times a day (BID) | INTRAMUSCULAR | Status: DC
  Administered 2015-11-14: 30 mg via INTRAVENOUS
  Filled 2015-11-14: qty 1

## 2015-11-14 MED ORDER — HYDROCORTISONE NA SUCCINATE PF 100 MG IJ SOLR
50.0000 mg | Freq: Four times a day (QID) | INTRAMUSCULAR | Status: DC
  Administered 2015-11-14 – 2015-11-18 (×13): 50 mg via INTRAVENOUS
  Filled 2015-11-14 (×13): qty 2

## 2015-11-14 MED ORDER — LACTULOSE ENEMA
300.0000 mL | Freq: Once | ORAL | Status: AC
  Administered 2015-11-14: 300 mL via RECTAL
  Filled 2015-11-14: qty 300

## 2015-11-14 MED ORDER — SODIUM CHLORIDE 0.9 % IV BOLUS (SEPSIS): 500.0000 mL | Freq: Once | INTRAVENOUS | Status: DC

## 2015-11-14 MED ORDER — SENNA 8.6 MG PO TABS: 1.0000 | ORAL_TABLET | Freq: Every day | ORAL | Status: DC

## 2015-11-14 NOTE — [place] FL2 (Signed)
MEDICAID FL2 LEVEL OF CARE SCREENING TOOL     IDENTIFICATION  Patient Name: Ann [name] Birthdate: Dec 18, 1927 Sex: female Admission Date (Current Location): 11/10/2015  [facility] and [place] Number:  Producer, television/film/video and Address:  [facility], [address], [place], [place] [address]      Provider Number: [id number]  Attending Physician Name and Address:  Nestor Ramp, MD  Relative Name and Phone Number:       Current Level of Care: Hospital Recommended Level of Care: Skilled Nursing Facility Prior Approval Number:    Date Approved/Denied: 10/28/13 PASRR Number: [id number]  Discharge Plan: Home    Current Diagnoses: Patient Active Problem List   Diagnosis Date Noted  . Acute delirium   . Status post above knee amputation of left lower extremity (HCC)   . Pressure ulcer 11/11/2015  . Foot pain   . UTI (lower urinary tract infection)   . Alzheimer's disease   . Arterial occlusion (HCC) 11/10/2015  . Healthcare maintenance 05/20/2015  . Paroxysmal a-fib (HCC) 03/20/2015  . PAD (peripheral artery disease) (HCC) 03/20/2015  . Rash and nonspecific skin eruption 03/19/2015  . Dementia without behavioral disturbance 03/05/2015  . Genital symptoms, female 05/17/2014  . Chronic systolic CHF (congestive heart failure) (HCC) 02/08/2014  . Swallowing difficulty 12/13/2013  . Moderate malnutrition (HCC) 11/20/2013  . S/P BKA (below knee amputation) unilateral (HCC) 10/26/2013  . Shoulder mass 10/07/2011  . Keratosis of plantar aspect of foot 12/23/2010  . BACK PAIN 12/13/2008  . BUNIONS, BILATERAL 08/06/2008  . HYPERCHOLESTEROLEMIA 11/03/2006  . DEMENTIA, NOT SPECIFIED 11/03/2006  . CATARACT 11/03/2006  . HEARING LOSS NOS OR DEAFNESS 11/03/2006  . HYPERTENSION, BENIGN SYSTEMIC 11/03/2006  . CVA 11/03/2006  . GASTROESOPHAGEAL REFLUX, NO ESOPHAGITIS 11/03/2006  . Convulsions/seizures (HCC) 11/03/2006  . INCONTINENCE, URGE 11/03/2006     Orientation RESPIRATION BLADDER Height & Weight     Self  O2 (2 Liters) Incontinent Weight: 133 lb (60.328 kg) Height:   (154.9 cm)  BEHAVIORAL SYMPTOMS/MOOD NEUROLOGICAL BOWEL NUTRITION STATUS      Continent    AMBULATORY STATUS COMMUNICATION OF NEEDS Skin   Extensive Assist Verbally Surgical wounds, Other (Comment) (pressure ulcers)                       Personal Care Assistance Level of Assistance  Bathing, Feeding, Dressing Bathing Assistance: Maximum assistance Feeding assistance: Independent Dressing Assistance: Maximum assistance     Functional Limitations Info  Sight, Hearing, Speech Sight Info: Adequate Hearing Info: Impaired Speech Info: Adequate    SPECIAL CARE FACTORS FREQUENCY  PT (By licensed PT), OT (By licensed OT)                    Contractures      Additional Factors Info  Code Status, Allergies Code Status Info: FULL Allergies Info: Lisinopril           Current Medications (11/14/2015):  This is the current hospital active medication list Current Facility-Administered Medications  Medication Dose Route Frequency Provider Last Rate Last Dose  . acetaminophen (TYLENOL) suppository 650 mg  650 mg Rectal Q8H Arvilla Market, DO   650 mg at 11/14/15 0402  . amiodarone (PACERONE) tablet 200 mg  200 mg Oral BID Joanna Puff, MD   200 mg at 11/12/15 2254  . atorvastatin (LIPITOR) tablet 20 mg  20 mg Oral q1800 Joanna Puff, MD      .  cefTRIAXone (ROCEPHIN) 1 g in dextrose 5 % 50 mL IVPB  1 g Intravenous Q24H Joanna Puff, MD   1 g at 11/13/15 1557  . dextrose 5 %-0.9 % sodium chloride infusion   Intravenous Continuous Kathee Delton, MD 100 mL/hr at 11/14/15 0436    . feeding supplement (ENSURE ENLIVE) (ENSURE ENLIVE) liquid 237 mL  237 mL Oral BID BM Nestor Ramp, MD   237 mL at 11/12/15 1413  . lactulose (CHRONULAC) enema 200 gm  300 mL Rectal Once Kathee Delton, MD      . levETIRAcetam (KEPPRA) 500 mg in sodium  chloride 0.9 % 100 mL IVPB  500 mg Intravenous Q12H Kathee Delton, MD   500 mg at 11/14/15 0433  . memantine (NAMENDA) tablet 10 mg  10 mg Oral BID Joanna Puff, MD   10 mg at 11/13/15 2252  . methocarbamol (ROBAXIN) tablet 500 mg  500 mg Oral Q6H PRN Nadara Mustard, MD       Or  . methocarbamol (ROBAXIN) 500 mg in dextrose 5 % 50 mL IVPB  500 mg Intravenous Q6H PRN Nadara Mustard, MD      . metoCLOPramide (REGLAN) tablet 5-10 mg  5-10 mg Oral Q8H PRN Nadara Mustard, MD       Or  . metoCLOPramide (REGLAN) injection 5-10 mg  5-10 mg Intravenous Q8H PRN Aldean Baker V, MD      . morphine 2 MG/ML injection 1 mg  1 mg Intravenous Q6H Arvilla Market, DO   1 mg at 11/13/15 1651  . morphine 2 MG/ML injection 1 mg  1 mg Intravenous Q3H PRN Casey Burkitt, MD      . multivitamin with minerals tablet 1 tablet  1 tablet Oral Daily Salem Senate, RD   1 tablet at 11/12/15 1044  . ondansetron (ZOFRAN) tablet 4 mg  4 mg Oral Q6H PRN Nadara Mustard, MD       Or  . ondansetron [facility]) injection 4 mg  4 mg Intravenous Q6H PRN Nadara Mustard, MD      . polyethylene glycol (MIRALAX / GLYCOLAX) packet 17 g  17 g Oral BID Joanna Puff, MD   17 g at 11/12/15 2254  . Rivaroxaban (XARELTO) tablet 15 mg  15 mg Oral Q supper Robinette Haines, RPH   15 mg at 11/12/15 1705  . sodium chloride flush (NS) 0.9 % injection 3 mL  3 mL Intravenous Q12H Joanna Puff, MD   3 mL at 11/14/15 1975     Discharge Medications: Please see discharge summary for a list of discharge medications.  Relevant Imaging Results:  Relevant Lab Results:   Additional Information SS#: [id number]  Loleta Dicker, LCSW

## 2015-11-14 NOTE — Progress Notes (Signed)
Chaplain stopped by to complete an AD. However Pt. was not a mental state to complete it. If you have any questions please page the chaplain office.   Thanks  Chaplain [employer]

## 2015-11-14 NOTE — Progress Notes (Signed)
Talked with Family medicine regarding pt's status (pt is not eating or drinking since 3 days and do not have urine output), bladder scan shows less than 100, incontinent just for 1 time overnight, just responding to voice and pain, getting pain medicine around the clock, BP is running low (80/60), talked with daughter at bed side this morning, they want to change the code status of the patient and go for palliative care at this point, informed to family medicine, will continue to monitor the patient.

## 2015-11-14 NOTE — Consult Note (Addendum)
Consultation Note Date: 11/14/2015   Patient Name: Ann [name]  DOB: 1927/09/16  MRN: [id number]  Age / Sex: 82 y.o., female  PCP: Katheren Shams, DO Referring Physician: Dickie La, MD  Reason for Consultation: Establishing goals of care and Psychosocial/spiritual support    Clinical Assessment/Narrative: Patient is an 82 year old female with a history of dementia peripheral artery disease, right below the knee amputation in 2015, DVT hypertension, seizures. She was admitted with a cold, painful left lower extremity. Family reports that she didn't confused and not eating. Patient underwent a left below the knee amputation on 11/11/2015 patient has been hypotensive, agitated, delirious, and has stopped eating 3 days ago. I met with her daughter Leilana Mcquire with whom she lives to initiate goals of care discussions. Patient lives with her daughter her nephew as well as her granddaughter. Patient has another daughter, Kennyth Lose, but neither daughter has been legally stipulated as a healthcare power of attorney. Her daughter Florian Buff reports that she had her sister are not close and getting them together might be difficult. Florian Buff is able to articulate that this amputation and patient's ability to bounce back has been different than what she saw in 2015. Postop in 2015 patient was delirious initially did not eat but she reports that this appears worse to her as well as her daughter, Estill Bamberg, who is one of patient's primary caregivers. Her leg was amputated in 2015, patient did go to a skilled nursing facility for rehabilitation and then returned home. Family has had hospice in the home, and was pleased with this service, but patient stabilized and was discharged from hospice services. I spent a lot of time on the disease trajectory related to dementia and how often times these patients with fragile brains have difficulty bouncing back to  the previous baseline. He seems to have a good grasp of how ill her mother is, but is wrestling with wanting to make sure she gets her every chance. After further discussion she continues to want to try to treat the treatable, such as antibiotics fluids and even rehabilitation if she is able to do this. She does not however want to escalate care and would not want to see her on a breathing machine. We discussed in more depth full code versus DO NOT RESUSCITATE. I stressed that DO NOT RESUSCITATE does not mean do not treat and she feels at this point DO NOT RESUSCITATE DO NOT INTUBATE would be what her mother wouldn't want. She doesn't want to meet again on 11/15/2015 with her daughter being present as we discuss further goals of care.  Contacts/Participants in Discussion: Primary Decision Maker: Judene Companion   Relationship to Patient daughter HCPOA: no  Patient has another daughter, named Kennyth Lose. Per Florian Buff she makes all the decisions but it does not sound as though there is a legal healthcare power of attorney at this point. Patient is unable to make decisions herself. Per nurse at bedside, the other daughter Kennyth Lose, made a comment to contact her sister are all decisions, but I am quoting second hand information here  SUMMARY OF RECOMMENDATIONS Ideally need to have a family meeting and have both children agree. I did leave a message for Kennyth Lose to return the call Agree with the decision not to escalate care in terms of cardio pulmonary resuscitation and ventilation. We'll contact attending and update them as to decision made by one daughter but our healthcare power of attorney situation. Per Beulah patient has been under the care of hospice  and a DO NOT RESUSCITATE status in the past I did broach the subject the patient continued not to eat about feeding tubes. I dressed the literature does not support that feeding tubes extend life in the setting of advanced dementia and that it does not avoid the risk  of aspiration. This is a scenario that she hopes that we discuss tomorrow with her daughter Rhina Brackett continue with full code for now. I will continue to try to reach other daughter in order to arrive at a consensus that is clear to all family members. I did convey this to her attending.  Code Status/Advance Care Planning: Full code    Code Status Orders        Start     Ordered   11/10/15 1756  Full code   Continuous     11/10/15 1758    Code Status History    Date Active Date Inactive Code Status Order ID Comments User Context   12/30/2014  3:11 AM 01/01/2015 10:17 PM Full Code [id number]  Veatrice Bourbon, MD ED   11/19/2013  3:09 AM 11/23/2013  9:17 PM Full Code [id number]  Leeanne Rio, MD Inpatient   10/26/2013  4:40 PM 10/29/2013  9:47 PM Full Code [id number]  Newt Minion, MD Inpatient   02/18/2013 10:29 PM 02/22/2013  7:50 PM Full Code [id number]  Leeanne Rio, MD Inpatient      Other Directives:None  Symptom Management:   Pain: Needs improvement. Continue with scheduled Tylenol as well as morphine IV 1 mg when necessary  Palliative Prophylaxis:   Aspiration, Bowel Regimen, Delirium Protocol, Frequent Pain Assessment, Oral Care, Palliative Wound Care and Turn Reposition  Additional Recommendations (Limitations, Scope, Preferences):  Full Scope Treatment until we can speak to daughter Kennyth Lose   Psycho-social/Spiritual:  Support System: Adequate Desire for further Chaplaincy support:no   Prognosis: Less than 6 months. Likely would meet hospice in-home criteria. However she continues to not eat anything her prognosis could very widely and she might meet inpatient hospice criteria  Discharge Planning: [place] for rehab with Palliative care service follow-up   Chief Complaint/ Primary Diagnoses: Present on Admission:  . Arterial occlusion ([place]) . Pressure ulcer . Foot pain . UTI (lower urinary tract infection) . Alzheimer's disease .  Acute delirium  I have reviewed the medical record, interviewed the patient and family, and examined the patient. The following aspects are pertinent.  Past Medical History  Diagnosis Date  . Hypertension   . Dementia   . Hyperlipidemia   . Seizures ([place])   . Metatarsal bone fracture     Right - base of 5th  . DVT (deep venous thrombosis) ([place]) 02/22/2013    02/18/2013: Left lower extremity (femoral and popliteal) subacute Treated with coumadin x 6-7 months    . Stroke ([place])   . Trigeminal neuralgia 11/03/2006    Qualifier: Diagnosis of  By: Eusebio Friendly    . CATARACT 11/03/2006    Qualifier: Diagnosis of  By: Eusebio Friendly    . HEARING LOSS NOS OR DEAFNESS 11/03/2006    Qualifier: Diagnosis of  By: Eusebio Friendly    . INCONTINENCE, URGE 11/03/2006    Qualifier: Diagnosis of  By: Eusebio Friendly    . Gangrene ([place])   . Atrial fibrillation ([place])   . Shortness of breath dyspnea    Social History   Social History  . Marital Status: Widowed    Spouse Name: N/A  . Number of Children: N/A  .  Years of Education: N/A   Social History Main Topics  . Smoking status: Never Smoker   . Smokeless tobacco: Never Used  . Alcohol Use: No  . Drug Use: No  . Sexual Activity: Not Asked   Other Topics Concern  . None   Social History Narrative   Family History  Problem Relation Age of Onset  . Hypertension Mother   . Arthritis Father   . Heart disease Daughter     Heart Disease before age 52  . Hyperlipidemia Daughter   . Hypertension Daughter    Scheduled Meds: . acetaminophen  650 mg Rectal Q8H  . amiodarone  200 mg Oral BID  . atorvastatin  20 mg Oral q1800  . cefTRIAXone (ROCEPHIN)  IV  1 g Intravenous Q24H  . feeding supplement (ENSURE ENLIVE)  237 mL Oral BID BM  . levETIRAcetam  500 mg Intravenous Q12H  . memantine  10 mg Oral BID  . methylPREDNISolone (SOLU-MEDROL) injection  30 mg Intravenous Q12H  .  morphine injection  1 mg Intravenous Q6H  .  multivitamin with minerals  1 tablet Oral Daily  . polyethylene glycol  17 g Oral BID  . rivaroxaban  15 mg Oral Q supper  . sodium chloride flush  3 mL Intravenous Q12H   Continuous Infusions: . dextrose 5 % and 0.9% NaCl 100 mL/hr at 11/14/15 1528   PRN Meds:.methocarbamol **OR** methocarbamol (ROBAXIN)  IV, metoCLOPramide **OR** metoCLOPramide (REGLAN) injection, morphine injection, ondansetron **OR** ondansetron (ZOFRAN) IV Medications Prior to Admission:  Prior to Admission medications   Medication Sig Start Date End Date Taking? Authorizing Provider  amiodarone (PACERONE) 200 MG tablet TAKE 1 TABLET BY MOUTH TWICE DAILY 11/29/14  Yes Katheren Shams, DO  amLODipine (NORVASC) 10 MG tablet TAKE 1 TABLET BY MOUTH DAILY. 08/18/15  Yes Katheren Shams, DO  carvedilol (COREG) 3.125 MG tablet TAKE 1 TABLET BY MOUTH TWICE DAILY WITH A MEAL 12/09/14  Yes Katheren Shams, DO  levETIRAcetam (KEPPRA) 500 MG tablet TAKE 1 TABLET BY MOUTH 2 TIMES DAILY. 06/26/15  Yes Katheren Shams, DO  loratadine (CLARITIN) 5 MG/5ML syrup Take 10 mLs (10 mg total) by mouth daily as needed for itching. 05/15/15  Yes Katheren Shams, DO  memantine (NAMENDA) 10 MG tablet Take 1 tablet (10 mg total) by mouth 2 (two) times daily. 08/12/15  Yes Katheren Shams, DO  polyethylene glycol (MIRALAX / GLYCOLAX) packet Take 17 g by mouth daily as needed for moderate constipation.   Yes Historical Provider, MD  triamcinolone cream (KENALOG) 0.1 % Apply 1 application topically 2 (two) times daily. 03/19/15  Yes Olam Idler, MD  XARELTO 15 MG TABS tablet TAKE 1 TABLET BY MOUTH DAILY 10/31/15  Yes Katheren Shams, DO  cephALEXin (KEFLEX) 500 MG capsule Take 1 capsule (500 mg total) by mouth 4 (four) times daily. 11/10/15   Merrily Pew, MD   Allergies  Allergen Reactions  . Lisinopril Other (See Comments)    Possible Angioedema on 11/16/13 admission.    Review of Systems  Unable to perform ROS   Physical Exam  Nursing note and vitals  reviewed. Constitutional:  Cachetic acutely ill appearing elderly female  HENT:  Head: Normocephalic and atraumatic.  Neck: Normal range of motion.  Cardiovascular:  irrg  Respiratory: Effort normal.  Musculoskeletal:  bilat AKA  Skin: Skin is warm.  Psychiatric:  Agitated, confused    Vital Signs: BP 80/60 mmHg  Pulse 72  Temp(Src) 98.8  F (37.1 C) (Oral)  Resp 18  Ht _0  (1.549 m)  Wt 60.328 kg (133 lb)  BMI 25.14 kg/m2  SpO2 96%  SpO2: SpO2: 96 % O2 Device:SpO2: 96 % O2 Flow Rate: .O2 Flow Rate (L/min): 2 L/min  IO: Intake/output summary:  Intake/Output Summary (Last 24 hours) at 11/14/15 1714 Last data filed at 11/14/15 0600  Gross per 24 hour  Intake 2806.67 ml  Output      1 ml  Net 2805.67 ml    LBM: Last BM Date: 11/12/15 Baseline Weight: Weight: 59.966 kg (132 lb 3.2 oz) Most recent weight: Weight: 60.328 kg (133 lb)      Palliative Assessment/Data:  Flowsheet Rows        Most Recent Value   Intake Tab    Referral Department  Hospitalist   Unit at Time of Referral  Med/Surg Unit   Palliative Care Primary Diagnosis  Sepsis/Infectious Disease   Date Notified  11/14/15   Palliative Care Type  New Palliative care   Reason for referral  Clarify Goals of Care   Date of Admission  11/10/15   Date first seen by Palliative Care  11/14/15   # of days Palliative referral response time  0 Day(s)   # of days IP prior to Palliative referral  4   Clinical Assessment    Palliative Performance Scale Score  20%   Pain Max last 24 hours  Not able to report   Pain Min Last 24 hours  Not able to report   Dyspnea Max Last 24 Hours  Not able to report   Dyspnea Min Last 24 hours  Not able to report   Nausea Max Last 24 Hours  Not able to report   Nausea Min Last 24 Hours  Not able to report   Anxiety Max Last 24 Hours  Not able to report   Anxiety Min Last 24 Hours  Not able to report   Other Max Last 24 Hours  Not able to report   Psychosocial & Spiritual  Assessment    Palliative Care Outcomes    Patient/Family meeting held?  Yes   Who was at the meeting?  daughter Quintin Alto   Palliative Care Outcomes  Clarified goals of care   Patient/Family wishes: Interventions discontinued/not started   Mechanical Ventilation, NIPPV, BiPAP, Trach   Palliative Care follow-up planned  Yes, Facility      Additional Data Reviewed:  CBC:    Component Value Date/Time   WBC 10.2 11/14/2015 0420   HGB 8.7* 11/14/2015 0420   HCT 27.1* 11/14/2015 0420   HCT 33.9* 12/30/2014 0340   PLT 205 11/14/2015 0420   MCV 82.1 11/14/2015 0420   NEUTROABS 9.2* 11/10/2015 1220   LYMPHSABS 0.8 11/10/2015 1220   MONOABS 0.9 11/10/2015 1220   EOSABS 0.0 11/10/2015 1220   BASOSABS 0.0 11/10/2015 1220   Comprehensive Metabolic Panel:    Component Value Date/Time   NA 144 11/13/2015 0512   K 3.9 11/13/2015 0512   CL 118* 11/13/2015 0512   CO2 17* 11/13/2015 0512   BUN 25* 11/13/2015 0512   CREATININE 1.30* 11/13/2015 0512   CREATININE 1.11* 02/28/2013 1652   GLUCOSE 206* 11/13/2015 0512   CALCIUM 7.8* 11/13/2015 0512   AST 117* 11/10/2015 1220   ALT 34 11/10/2015 1220   ALKPHOS 83 11/10/2015 1220   BILITOT 3.3* 11/10/2015 1220   PROT 7.4 11/10/2015 1220   ALBUMIN 2.7* 11/10/2015 1220     Time In:  1630 Time Out: 1740 Time Total: 70 min Greater than 50%  of this time was spent counseling and coordinating care related to the above assessment and plan.  Signed by: Dory Horn, NP  Dory Horn, NP  11/14/2015, 5:14 PM  Please contact Palliative Medicine Team phone at [phone] for questions and concerns.   Spoke with other daughter Kennyth Lose, about advanced directives and further goals of care, as well as inviting her to family meeting scheduled for 11/15/2015. Kennyth Lose states her relationship with her sister is very contentious and she declines to be part of the meeting. She is in agreement with DO NOT RESUSCITATE status. I did ask her about her  views on feeding tubes, and she stated she would not be in favor of that however she is deferring all decisions to her sister, Florian Buff. Apparently Mrs. Brener has a son as well who is incarcerated. Per Kennyth Lose, her brother, and she are in agreement that their mother has suffered enough. I offered to keep Kennyth Lose in the loop with continued phone conversations and she was appreciative of that.

## 2015-11-14 NOTE — Progress Notes (Signed)
Family Medicine Teaching Service Daily Progress Note Intern Pager: [phone]  Patient name: Ann [name] Medical record number: [id number] Date of birth: [birth year] Age: 82 y.o. Gender: female  Primary Care Provider: Caryl Ada, DO Consultants: VVS, Ortho Code Status: Full  Pt Overview and Major Events to Date:  Admitted 11/10/15 with dry gangrene left leg L AKA 11/11/15  Assessment and Plan: Ann [name] is a 82 y.o. female who presented with cold, painful left LE, confusion, and concerns for UTI . PMH is significant for PAD s/p R BKA, dementia, DVT, PAF, HTN, and seizures. LKA performed 11/11/15. Patient had fevers yesterday afternoon/early evening. She was afebrile overnight. She continues not to take food. WBC improved at 10.2 from 12.3 yesterday, suspect post-op reactive leukocytosis. She continues to be hypotensive. She is more alert this morning, trying to answer questions but not having fully intelligible speech.   Dry gangrene in setting of PAD, now s/p L AKA: patient with a h/o of R BKA with Dr. Lajoyce Corners on 10/26/13, presented with cold, painful left lower extremity. An aortogram in 10/2013 revealed occlusion of the popliteal and below on the right as well. She failed conservative management and had L AKA 11/11/15.  - Continue Lipitor - Patient is not indicating pain at AKA site - Continue scheduled Tylenol 650 mg q8h  - Continue scheduled IV morphine 1 mg q6h and prn 1 mg q3h ordered - Hgb/hct stable at 8.7/27.1 from 9.0/27.0 yesterday and from post op H/H 9.8/29.9, which is down from 10.8/31.6; op note reports minimal blood loss - On 2L O2 by [place] for comfort after surgery. Tried to wean yesterday but patient desaturated to the high 70's.  - PT recommending SNF for discharge, daughter Ann [name] in agreement  Hypotension: - Continue MIVFs. Bolus as needed. - Start steroid burst. - Consider transfer to SDU if not improved. - Continue to hold home amlodipine - Holding home coreg   Urinary  tract infection: Urinalysis consistent with UTI with nitrite and leukocyte esterase. This may be contributing to confusion. No lower back pain.  - continue ceftriaxone for 7 days (Day 4)  Confusion: concern from family that the patient was more somnolent and less conversant over the 1-2 weeks. Neuro exam non-focal. U/A appears consistent with UTI with nitrite and leukocyte esterase.May also be a component of dehydration given poor PO intake recently. Patient has been minimally verbal during hospitalization but was trying to hold conversation this morning with prompting. Bladder scan yesterday was negative for retention. Patient not taking miralax and has not had recent BM. Did not initially obtain head CT, as patient has dementia at baseline, but given slow improvement will order today. Diminished stress response could also be contributing.   - treat UTI with ceftriaxone x 7 days  - hydrate with D5NS @ 100cc/hr - More alert today but still refusing PO - Start steroid burst - Obtain non contrast head CT - Obtain KUB - Give lactulose enema.  - Obtain cortisol level - Up to chair - Daughter interested in re-addressing goals of care. Chaplain consulted to assist in discussion and address advance directives.  Dementia: High risk for delirium - continue home Namenda  Paroxysmal atrial fibrillation: currently in NSR.  - Continue home amiodarone (did not take yesterday's dose) - Continue Xareltro  Constipation: Reported for approximately 2 weeks, though had BM 11/11/15. This could be a cause of patient's poor PO intake. Abdominal exam unremarkable.  - Increased MiraLax to BID   Seizures: No recent seizure  like activity. - continue Keppra (made IV, as patient having trouble taking PO)  FEN/GI: Regular diet, D5NS @ 100cc/hr  Prophylaxis: xarelto  Disposition: admitted to inpatient, attending Dr. Jennette Kettle  Subjective:  Patient is more easy to arouse this morning and tries to respond to  questions. Took one mouthful of pudding, per daughter.   Objective: Temp:  [97.6 F (36.4 C)-98.8 F (37.1 C)] 98.8 F (37.1 C) (03/10 0357) Pulse Rate:  [74-80] 78 (03/10 0357) Resp:  [16-20] 18 (03/10 0357) BP: (77-90)/(51-67) 80/60 mmHg (03/10 0357) SpO2:  [77 %-96 %] 95 % (03/10 0357) Weight:  [133 lb (60.328 kg)] 133 lb (60.328 kg) (03/10 0351) Physical Exam: General: [name], sitting up in bed  Cardiovascular: RRR. No murmurs appreciated. Strong radial pulses.  Chest: Lungs CTAB. No increased WOB. [place] in place.  Abdomen: +BS, S, some moaning with palpation of abdomen, ND Extremities: L AKA with wound vac in place. No drainage from wound vac. No TTP at stump or erythema.  Neuro: Opens eyes to questions or tactile stimuli. Trying to answer questions but speech mostly unintelligible.   Laboratory:  Recent Labs Lab 11/12/15 0520 11/13/15 0512 11/14/15 0420  WBC 9.9 12.3* 10.2  HGB 9.8* 9.0* 8.7*  HCT 29.9* 27.0* 27.1*  PLT 197 231 205    Recent Labs Lab 11/10/15 1220 11/11/15 0528 11/12/15 0520 11/13/15 0512  NA 140 142 146* 144  K 4.0 3.8 4.2 3.9  CL 103 109 115* 118*  CO2 26 23 20* 17*  BUN 21* 21* 19 25*  CREATININE 1.21* 1.09* 1.03* 1.30*  CALCIUM 8.7* 7.9* 7.8* 7.8*  PROT 7.4  --   --   --   BILITOT 3.3*  --   --   --   ALKPHOS 83  --   --   --   ALT 34  --   --   --   AST 117*  --   --   --   GLUCOSE 119* 89 87 206*    Imaging/Diagnostic Tests: No results found.  Casey Burkitt, MD 11/14/2015, 8:12 AM PGY-1, [facility] Family Medicine FPTS Intern pager: [phone], text pages welcome

## 2015-11-14 NOTE — Clinical Social Work Note (Signed)
Clinical Social Work Assessment  Patient Details  Name: Ann [name] MRN: [id number] Date of Birth: Aug 03, 1928  Date of referral:  11/14/15               Reason for consult:  Facility Placement                Permission sought to share information with:  Case Manager, Family Supports Permission granted to share information::  Yes, Verbal Permission Granted  Name::     Ann [name]  Agency::     Relationship::  Daughter  Contact Information:  [phone]  Housing/Transportation Living arrangements for the past 2 months:  Single Family Home (Patient lives with daughter) Source of Information:  Adult Children Patient Interpreter Needed:  None Criminal Activity/Legal Involvement Pertinent to Current Situation/Hospitalization:  No - Comment as needed Significant Relationships:  Adult Children Lives with:  Adult Children Do you feel safe going back to the place where you live?  Yes Need for family participation in patient care:  Yes (Comment)  Care giving concerns:  Daughter informed CSW that she spoke with the patient regarding short term rehab and believes this is the best choice for her at this time.    Social Worker assessment / plan:  CSW received consult by MD. CSW went to speak with patient regarding the possible need for short term rehab. CSW introduced self and acknowledged the patient. Patient appeared to be in pain and unable to assess at this time.CSW contacted daughter [name] to discuss plans for discharge. Daughter was calm and cooperative with CSW assessment. CSW informed daughter of PT recommendation for short term rehab. Daughter is agreeable to SNF placement. CSW was provided permission to fax clinical information out to the facilities in [facility]. Patient has been to [facility] and [facility] in the past. Daughter said at this time she does not have any preferences. CSW to complete FL2 for MD signature. CSW to initiate SNF placement process.    Employment status:   Disabled (Comment on whether or not currently receiving Disability) Insurance information:  Managed Medicare PT Recommendations:  Skilled Nursing Facility Information / Referral to community resources:     Patient/Family's Response to care:  Daughter is agreeable to SNF placement for the patient.   Patient/Family's Understanding of and Emotional Response to Diagnosis, Current Treatment, and Prognosis: Daughter was very cooperative with CSW assessment. Family is very involved in patient's care and is aware of current treatment and prognosis. Daughter is agreeable to disposition plan. Daughter reports patient lives with her and would probably like to go home. However, feels short term placement will be beneficial for the patient.  Daughter is hopeful for patient's progress and return back home. Daughter was appreciative of CSW intervention.    Emotional Assessment Appearance:  Appears stated age Attitude/Demeanor/Rapport:   (Unable to assess at this time) Affect (typically observed):   (Unable to assess at this time) Orientation:   (Unable to assess at this time) Alcohol / Substance use:  Not Applicable Psych involvement (Current and /or in the community):  No (Comment)  Discharge Needs  Concerns to be addressed:  Decision making concerns, Discharge Planning Concerns Readmission within the last 30 days:  No Current discharge risk:  Physical Impairment Barriers to Discharge:  Continued Medical Work up   [employer], LCSW 11/14/2015, 4:06 PM

## 2015-11-15 DIAGNOSIS — I959 Hypotension, unspecified: Secondary | ICD-10-CM | POA: Insufficient documentation

## 2015-11-15 DIAGNOSIS — R4182 Altered mental status, unspecified: Secondary | ICD-10-CM

## 2015-11-15 LAB — CBC
HEMATOCRIT: 28.3 % — AB (ref 36.0–46.0)
HEMOGLOBIN: 9.1 g/dL — AB (ref 12.0–15.0)
MCH: 26.1 pg (ref 26.0–34.0)
MCHC: 32.2 g/dL (ref 30.0–36.0)
MCV: 81.1 fL (ref 78.0–100.0)
Platelets: 282 10*3/uL (ref 150–400)
RBC: 3.49 MIL/uL — ABNORMAL LOW (ref 3.87–5.11)
RDW: 20.5 % — ABNORMAL HIGH (ref 11.5–15.5)
WBC: 11.8 10*3/uL — ABNORMAL HIGH (ref 4.0–10.5)

## 2015-11-15 LAB — GLUCOSE, CAPILLARY
GLUCOSE-CAPILLARY: 192 mg/dL — AB (ref 65–99)
GLUCOSE-CAPILLARY: 171 mg/dL — AB (ref 65–99)
GLUCOSE-CAPILLARY: 191 mg/dL — AB (ref 65–99)

## 2015-11-15 LAB — BASIC METABOLIC PANEL
Anion gap: 9 (ref 5–15)
BUN: 24 mg/dL — ABNORMAL HIGH (ref 6–20)
CHLORIDE: 125 mmol/L — AB (ref 101–111)
CO2: 15 mmol/L — ABNORMAL LOW (ref 22–32)
Calcium: 7.7 mg/dL — ABNORMAL LOW (ref 8.9–10.3)
Creatinine, Ser: 1.21 mg/dL — ABNORMAL HIGH (ref 0.44–1.00)
GFR calc Af Amer: 46 mL/min — ABNORMAL LOW (ref >60)
GFR, EST NON AFRICAN AMERICAN: 40 mL/min — AB (ref >60)
Glucose, Bld: 232 mg/dL — ABNORMAL HIGH (ref 65–99)
POTASSIUM: 3.8 mmol/L (ref 3.5–5.1)
Sodium: 149 mmol/L — ABNORMAL HIGH (ref 135–145)

## 2015-11-15 MED ORDER — POLYETHYLENE GLYCOL 3350 17 G PO PACK
17.0000 g | PACK | Freq: Every day | ORAL | Status: DC
  Administered 2015-11-17 – 2015-11-19 (×3): 17 g via ORAL
  Filled 2015-11-15 (×3): qty 1

## 2015-11-15 NOTE — Progress Notes (Addendum)
LM for pt's daughter Annice Pih ( message #2 ) to return call. I also included in the message that we were having a family meeting at 1430 and I extended an invitation to her. Will continue to try and reach daughter Annice Pih. Will continue and try and reach daughter. Eduard Roux, ACHPN-ANP  Spoke to dtr [place] later. She is in agreement with DNR. She also stated that she would not be in favor of feeding tube but will defer all decision making to her sister beaulah, even feeding tube, hospice etc. Appt scheduled again for Mon at 1230 hopefully with [name] and her daughter. Dtr debating between home with hospice vs. SNF She recognizes that her ability to rehab is very limited. Watchful waiting over the weekened Eduard Roux, ANP

## 2015-11-15 NOTE — Progress Notes (Signed)
CSW contacted patient's daughter Fronie Magnin via telephone to provide bed offers. However, daughter is currently at work and unable to speak on phone. Daughter informed CSW that she will be at the hospital around 1:00pm for a meeting, and she will call CSW around that time.   CSW will continue to follow and provide support to patient while in hospital. Awaiting phone call back from daughter to discuss bed offers.   Fernande Boyden, LCSWA Clinical Social Worker [facility] Ph: [phone]

## 2015-11-15 NOTE — Progress Notes (Signed)
Family Medicine Teaching Service Daily Progress Note Intern Pager: [phone]  Patient name: Ann [name] Medical record number: [id number] Date of birth: [birth year] Age: 82 y.o. Gender: female  Primary Care Provider: Caryl Ada, DO Consultants: VVS, Ortho Code Status: Full  Pt Overview and Major Events to Date:  Admitted 11/10/15 with dry gangrene left leg L AKA 11/11/15  Assessment and Plan: Ann [name] is a 82 y.o. female who presented with cold, painful left LE, confusion, and concerns for UTI . PMH is significant for PAD s/p R BKA, dementia, DVT, PAF, HTN, and seizures. LKA performed 11/11/15. Patient had fevers yesterday afternoon/early evening. She was afebrile overnight. She continues to be hypotensive. Somnolent this AM.   Dry gangrene in setting of PAD, now s/p L AKA: patient with a h/o of R BKA with Dr. Lajoyce Corners on 10/26/13, presented with cold, painful left lower extremity. An aortogram in 10/2013 revealed occlusion of the popliteal and below on the right as well. She failed conservative management and had L AKA 11/11/15.  - Continue Lipitor - Patient is not indicating pain at AKA site- wound vac in place.  - Continue scheduled Tylenol 650 mg q8h  - Continue IV morphine1 mg q3h PRN pain  - On RA satting 92-95%.  - PT recommending SNF for discharge, daughter [name] in agreement - CSW consulted   Hypotension: - Continue MIVFs. Bolus as needed. - Solu-cortef day 2.  - continue to monitor and transfer to SDU if worsening.  - Continue to hold home amlodipine  - Holding home coreg   Confusion: concern from family that the patient was more somnolent and less conversant over the 1-2 weeks. Neuro exam non-focal. U/A appears consistent with UTI with nitrite and leukocyte esterase.May also be a component of dehydration given poor PO intake recently. Patient has been minimally verbal during hospitalization but was trying to hold conversation this morning with prompting. Bladder scan yesterday  was negative for retention. Patient not taking miralax and has not had recent BM. CT without acute changes. Diminished stress response could also be contributing.  Unsure if poor PO intake is secondary to confusion or if confusion is secondary to poor PO intake.  - treat UTI with ceftriaxone x 7 days (day 5) - hydrate with D5NS @ 100cc/hr - Up to chair - Daughter interested in re-addressing goals of care. Chaplain consulted to assist in discussion and address advance directives. - given decreased alertness, speech eval for swallow, currently NPO with sips with meds.   Dementia: High risk for delirium - continue home Namenda  Paroxysmal atrial fibrillation: currently in NSR.  - Continue home amiodarone (did not take yesterday's dose) - Continue Xareltro  Constipation: Reported for approximately 2 weeks, though had BM 11/11/15. KUB without significant stool burden on my exam.  Decreased BMs could be secondary to poor PO intake. Abdominal exam unremarkable.  - continue MiraLax daily.   Seizures: No recent seizure like activity. - continue Keppra (made IV, as patient having trouble taking PO)  FEN/GI: NPO, D5NS @ 100cc/hr  Prophylaxis: xarelto  Disposition: admitted to inpatient, attending Dr. Jennette Kettle  Subjective:  Asleep. Does not answer questions. Only intermittently moans with pulling the blankets off her.   Objective: Temp:  [97.6 F (36.4 C)-98.8 F (37.1 C)] 97.6 F (36.4 C) (03/11 0452) Pulse Rate:  [72-78] 78 (03/11 0639) Resp:  [14-18] 14 (03/11 0452) BP: (77-96)/(54-67) 96/63 mmHg (03/11 0639) SpO2:  [92 %-96 %] 93 % (03/11 0452) Physical Exam: General: [name],  lying in bed  Cardiovascular: RRR. No murmurs appreciated. Strong radial pulses.  Chest: Lungs CTAB. No increased WOB. [place] in place.  Abdomen: +BS, S, some moaning with palpation of abdomen, ND Extremities: L AKA with wound vac in place. No drainage from wound vac. No TTP at stump or erythema.  Neuro:  Moans with removing blankets and painful stimuli, does not open eyes.   Laboratory:  Recent Labs Lab 11/12/15 0520 11/13/15 0512 11/14/15 0420  WBC 9.9 12.3* 10.2  HGB 9.8* 9.0* 8.7*  HCT 29.9* 27.0* 27.1*  PLT 197 231 205    Recent Labs Lab 11/10/15 1220  11/12/15 0520 11/13/15 0512 11/15/15 0525  NA 140  < > 146* 144 149*  K 4.0  < > 4.2 3.9 3.8  CL 103  < > 115* 118* 125*  CO2 26  < > 20* 17* 15*  BUN 21*  < > 19 25* 24*  CREATININE 1.21*  < > 1.03* 1.30* 1.21*  CALCIUM 8.7*  < > 7.8* 7.8* 7.7*  PROT 7.4  --   --   --   --   BILITOT 3.3*  --   --   --   --   ALKPHOS 83  --   --   --   --   ALT 34  --   --   --   --   AST 117*  --   --   --   --   GLUCOSE 119*  < > 87 206* 232*  < > = values in this interval not displayed.  Imaging/Diagnostic Tests: Dg Abd 1 View  11/14/2015  CLINICAL DATA:  Abdominal pain, possible constipation EXAM: ABDOMEN - 1 VIEW COMPARISON:  None. FINDINGS: Scattered large and small bowel gas is noted. Mild fecal material is noted although no significant retention is seen. Degenerative changes of the lumbar spine are noted. Heavy vascular calcifications are seen. Diverticulosis is noted. IMPRESSION: No acute abnormality noted. Electronically Signed   By: Alcide Clever M.D.   On: 11/14/2015 14:21   Ct Head Wo Contrast  11/14/2015  CLINICAL DATA:  Altered mental status status post leg amputation, dementia EXAM: CT HEAD WITHOUT CONTRAST TECHNIQUE: Contiguous axial images were obtained from the base of the skull through the vertex without intravenous contrast. COMPARISON:  12/30/2014 FINDINGS: Motion degraded images. No evidence of parenchymal hemorrhage or extra-axial fluid collection. No mass lesion, mass effect, or midline shift. No CT evidence of acute infarction. Encephalomalacic changes in the right anterior temporal lobe, chronic. Extensive small vessel ischemic changes. Global cortical atrophy.  No ventriculomegaly. Chronic opacification of the  right maxillary sinus. Mild mucosal thickening of the bilateral frontal, right ethmoid, and left maxillary sinuses. Mastoid air cells are clear. Prior bi-frontotemporal craniectomy. No evidence of calvarial fracture. IMPRESSION: Motion degraded images. No evidence of acute intracranial abnormality. Encephalomalacic changes in the right anterior temporal lobe, chronic. Atrophy with extensive small vessel ischemic changes. Electronically Signed   By: Charline Bills M.D.   On: 11/14/2015 17:19    Joanna Puff, MD 11/15/2015, 7:28 AM PGY-2, [facility] FPTS Intern pager: [phone], text pages welcome

## 2015-11-15 NOTE — Discharge Summary (Signed)
Family Medicine Teaching [facility] Discharge Summary  Patient name: NANNA ERTLE Medical record number: [id number] Date of birth: 28-Apr-1928 Age: 82 y.o. Gender: female Date of Admission: 11/10/2015  Date of Discharge: 11/19/2015 Admitting Physician: Nestor Ramp, MD  Primary Care Provider: Caryl Ada, DO Consultants: VVS, Ortho, Palliative Care  Code status: DNR  Indication for Hospitalization: cold, painful left lower extremity  Discharge Diagnoses/Problem List:  Active Problems:   s/p Left AKA   Arterial occlusion   Pressure ulcer   Foot pain   UTI (lower urinary tract infection)   Alzheimer's disease   Acute delirium   Palliative care encounter   Constipation   Altered mental state   Arterial hypotension  Disposition: Home with HH PT, RN and palliative care services  Discharge Condition: Improved  Discharge Exam:  Blood pressure 107/84, pulse 74, temperature 98.1 F (36.7 C), temperature source Oral, resp. rate 20, height 5\' 1"  (1.549 m), weight 143 lb 9.6 oz (65.137 kg), SpO2 94 %. General: Chronically ill appearing woman, sitting up in bed awake Cardiovascular: RRR. No murmurs appreciated.  Chest: Lungs CTAB. No increased WOB. Abdomen: +BS, S, ND Extremities: L AKA with wound vac in place. No drainage from wound vac. No TTP at stump or erythema. Hands slightly edematous. Neuro: More alert today. Oriented to self and general place (hospital) but not time. Speech intermittently intelligible (also edentulous). Rarely responded to direct questioning.   Brief Hospital Course:  Ms. Birdsell is a 82 y/o with a PMHx R BKA, PAD, dementia, and PAF who presented to the ED with a painful, cold left lower extremity. She was also noted to be more somnolent over the last 2 weeks, eating less and sleeping more.    VVS saw the patient and noted she was not a revascularization candidate from a previous aortogram that was preformed. Dr. Lajoyce Corners, who performed the patient's previous  BKA, performed a left AKA on 11/11/15. Post operatively, the patient's blood pressures became soft, requiring solu-cortef, and she continued to have significant somnolence with poor PO intake. She had a U/A on admission significant for nitrites and LE, but treating this with a 7-day course of ceftriaxone did not help her mental status. CT head was negative for acute changes. Discontinuing scheduled narcotic pain medication did help mental status somewhat, so patient was discharged on prn tylenol, ibuprofen and robaxin. PO intake improved dramatically by day of discharge but mental status was still altered, though patient could express some desires like for food and drink. Patient had wound vac in place until discharge with minimal drainage. Dr. Lajoyce Corners recommended daily dry dressing changes until follow-up with him in 2 weeks. Wound care had also followed patient for right buttock Stage 2 pressure injury, recommending foam dressing to prevent further skin breakdown. Physical therapy recommended SNF. Palliative care was consulted and had goals of care discussion with family, who decided to discharge home with home health and hospice services through [facility]. Patient's code status was changed to DNR during this hospital stay.   Issues for Follow Up:  1. Discussion of whether to continue preventative medicines like lipitor, namenda, xarelto and daily multivitamin for healing given hospice status. 2. Home coreg and amlodipine were stopped for soft BPs. Consider restarting if BPs increase.  3. Control of pain on just prn tylenol, ibuprofen and robaxin.  4. Adequacy of PO intake once home. Nutrition recommended Ensure feeding supplements twice daily.  Significant Procedures: None  Significant Labs and Imaging:  Recent Labs Lab 11/16/15 0537 11/17/15 0414 11/18/15 0443  WBC 9.1 9.2 9.5  HGB 10.0* 8.7* 9.3*  HCT 30.3* 27.0* 28.0*  PLT 214 276 271    Recent Labs Lab  11/13/15 0512 11/15/15 0525 11/18/15 0443 11/19/15 0530  NA 144 149* 149* 143  K 3.9 3.8 3.4* 3.8  CL 118* 125* 122* 114*  CO2 17* 15* 20* 21*  GLUCOSE 206* 232* 135* 147*  BUN 25* 24* 20 22*  CREATININE 1.30* 1.21* 1.09* 1.05*  CALCIUM 7.8* 7.7* 7.4* 7.4*   UA 11/10/15: Large hgb, 15 ketones, positive nitrites and small leukocytes Cortisol 11/14/15: 16.2 (abnormally WNL) Ammonia 11/14/15: 44 (H) TSH 11/14/2015: 0.561 (WNL)  Results/Tests Pending at Time of Discharge:   Discharge Medications:    Medication List    STOP taking these medications        amLODipine 10 MG tablet  Commonly known as:  NORVASC     carvedilol 3.125 MG tablet  Commonly known as:  COREG      TAKE these medications        acetaminophen 325 MG tablet  Commonly known as:  TYLENOL  Take 2 tablets (650 mg total) by mouth every 8 (eight) hours as needed for mild pain or moderate pain.     amiodarone 200 MG tablet  Commonly known as:  PACERONE  TAKE 1 TABLET BY MOUTH TWICE DAILY     atorvastatin 20 MG tablet  Commonly known as:  LIPITOR  Take 1 tablet (20 mg total) by mouth daily at 6 PM.     feeding supplement (ENSURE ENLIVE) Liqd  Take 237 mLs by mouth 2 (two) times daily between meals.     ibuprofen 200 MG tablet  Commonly known as:  ADVIL,MOTRIN  Take 1 tablet (200 mg total) by mouth every 6 (six) hours as needed for mild pain or moderate pain.     levETIRAcetam 500 MG tablet  Commonly known as:  KEPPRA  TAKE 1 TABLET BY MOUTH 2 TIMES DAILY.     loratadine 5 MG/5ML syrup  Commonly known as:  CLARITIN  Take 10 mLs (10 mg total) by mouth daily as needed for itching.     memantine 10 MG tablet  Commonly known as:  NAMENDA  Take 1 tablet (10 mg total) by mouth 2 (two) times daily.     methocarbamol 500 MG tablet  Commonly known as:  ROBAXIN  Take 1 tablet (500 mg total) by mouth every 6 (six) hours as needed for muscle spasms.     multivitamin with minerals Tabs tablet  Take 1 tablet  by mouth daily.     ondansetron 4 MG tablet  Commonly known as:  ZOFRAN  Take 1 tablet (4 mg total) by mouth every 6 (six) hours as needed for nausea.     polyethylene glycol packet  Commonly known as:  MIRALAX / GLYCOLAX  Take 17 g by mouth daily as needed for moderate constipation.     triamcinolone cream 0.1 %  Commonly known as:  KENALOG  Apply 1 application topically 2 (two) times daily.     XARELTO 15 MG Tabs tablet  Generic drug:  Rivaroxaban  TAKE 1 TABLET BY MOUTH DAILY        Discharge Instructions: Please refer to Patient Instructions section of EMR for full details.  Patient was counseled important signs and symptoms that should prompt return to medical care, changes in medications, dietary instructions, activity restrictions, and follow up appointments.  Follow-Up Appointments: Follow-up Information    Follow up with [facility] EMERGENCY DEPARTMENT.   Specialty:  Emergency Medicine   Why:  If symptoms worsen   Contact information:   [address] [id number] mc [place] [address] [phone]      Follow up with Nadara Mustard, MD On 12/01/2015.   Specialty:  Orthopedic Surgery   Why:   At 8:30am   Contact information:   [address] [place] [address] [phone]       Casey Burkitt, MD 11/19/2015, 1:29 PM PGY-1, [facility]

## 2015-11-15 NOTE — Evaluation (Signed)
Clinical/Bedside Swallow Evaluation Patient Details  Name: Ann [name] MRN: [id number] Date of Birth: 1928-04-29  Today's Date: 11/15/2015 Time: SLP Start Time (ACUTE ONLY): 1530 SLP Stop Time (ACUTE ONLY): 1550 SLP Time Calculation (min) (ACUTE ONLY): 20 min  Past Medical History:  Past Medical History  Diagnosis Date  . Hypertension   . Dementia   . Hyperlipidemia   . Seizures (HCC)   . Metatarsal bone fracture     Right - base of 5th  . DVT (deep venous thrombosis) (HCC) 02/22/2013    02/18/2013: Left lower extremity (femoral and popliteal) subacute Treated with coumadin x 6-7 months    . Stroke (HCC)   . Trigeminal neuralgia 11/03/2006    Qualifier: Diagnosis of  By: Abundio Miu    . CATARACT 11/03/2006    Qualifier: Diagnosis of  By: Abundio Miu    . HEARING LOSS NOS OR DEAFNESS 11/03/2006    Qualifier: Diagnosis of  By: Abundio Miu    . INCONTINENCE, URGE 11/03/2006    Qualifier: Diagnosis of  By: Abundio Miu    . Gangrene (HCC)   . Atrial fibrillation (HCC)   . Shortness of breath dyspnea    Past Surgical History:  Past Surgical History  Procedure Laterality Date  . Toe surgery      right 5th toe amputation  . Amputation Right 10/26/2013    Procedure: AMPUTATION BELOW KNEE;  Surgeon: Nadara Mustard, MD;  Location: [facility] OR;  Service: Orthopedics;  Laterality: Right;  Right Below Knee Amputation  . Abdominal aortagram N/A 09/13/2013    Procedure: ABDOMINAL Ronny Flurry;  Surgeon: Fransisco Hertz, MD;  Location: [facility] CATH LAB;  Service: Cardiovascular;  Laterality: N/A;  . Below knee leg amputation Right 10/2013  . Amputation Left 11/11/2015    Procedure: AMPUTATION ABOVE KNEE;  Surgeon: Nadara Mustard, MD;  Location: [facility] OR;  Service: Orthopedics;  Laterality: Left;   HPI:  82 y.o. female who presented with cold, painful left LE, confusion, and concerns for UTI . PMH is significant for PAD s/p R BKA, dementia, DVT, PAF, HTN, and seizures. LKA performed 11/11/15.  Pt  with somnolence, poor PO intake.  Palliative Medicine involve to assist family with GOC.   Assessment / Plan / Recommendation Clinical Impression  Pt presents with a dysphagia impacted primarily by impaired MS.  She accepted thin liquids and purees, but held most boluses orally despite max tactile/verbal cues to initiate a swallow response.  (Single swallow of water did not raise concerns for aspiration.) Material eventually had to be manually removed from mouth.  Pt has been followed by SLP services during prior admissions, having transient issues with swallowing due to AMS.  Recommend allowing sips of liquid if pt expresses that desire - hold if exhibits s/s of aspiration.  SLP will follow for GOC s/p Palliative medicine meeting.      Aspiration Risk     mild  Diet Recommendation    Sips of liquid per pt's preferences - hold if coughing  Medication Administration: Whole meds with puree    Other  Recommendations Oral Care Recommendations: Oral care QID   Follow up Recommendations   (tba)    Frequency and Duration min 2x/week  1 week       Prognosis Prognosis for Safe Diet Advancement: Fair      Swallow Study   General HPI: 82 y.o. female who presented with cold, painful left LE, confusion, and concerns for UTI . PMH is significant for PAD  s/p R BKA, dementia, DVT, PAF, HTN, and seizures. LKA performed 11/11/15.  Pt with somnolence, poor PO intake.  Palliative Medicine involve to assist family with GOC. Type of Study: Bedside Swallow Evaluation Previous Swallow Assessment: 11/2013 and 12/2014 Diet Prior to this Study: NPO (sips with meds) Temperature Spikes Noted: No Respiratory Status: Room air History of Recent Intubation: No Behavior/Cognition: Lethargic/Drowsy Oral Cavity Assessment: Within Functional Limits Oral Care Completed by SLP: No Oral Cavity - Dentition: Edentulous Self-Feeding Abilities: Needs assist Patient Positioning: Upright in bed Baseline Vocal Quality:  Normal Volitional Cough: Cognitively unable to elicit Volitional Swallow: Unable to elicit    Oral/Motor/Sensory Function Overall Oral Motor/Sensory Function: Other (comment) (not f/c; symmetric at baseline)   Ice Chips Ice chips: Not tested   Thin Liquid Thin Liquid: Impaired Presentation: Cup Oral Phase Impairments: Reduced labial seal Oral Phase Functional Implications: Oral holding    Nectar Thick Nectar Thick Liquid: Not tested   Honey Thick Honey Thick Liquid: Not tested   Puree Puree: Impaired Oral Phase Functional Implications: Oral holding   Solid   Kory Panjwani L. [place], [place] CCC/SLP Pager [phone]    Solid: Not tested        Blenda Mounts [facility] 11/15/2015,4:23 PM

## 2015-11-16 LAB — GLUCOSE, CAPILLARY
GLUCOSE-CAPILLARY: 165 mg/dL — AB (ref 65–99)
GLUCOSE-CAPILLARY: 166 mg/dL — AB (ref 65–99)
Glucose-Capillary: 205 mg/dL — ABNORMAL HIGH (ref 65–99)

## 2015-11-16 LAB — CBC
HCT: 30.3 % — ABNORMAL LOW (ref 36.0–46.0)
Hemoglobin: 10 g/dL — ABNORMAL LOW (ref 12.0–15.0)
MCH: 26.4 pg (ref 26.0–34.0)
MCHC: 33 g/dL (ref 30.0–36.0)
MCV: 79.9 fL (ref 78.0–100.0)
PLATELETS: 214 10*3/uL (ref 150–400)
RBC: 3.79 MIL/uL — AB (ref 3.87–5.11)
RDW: 20.8 % — ABNORMAL HIGH (ref 11.5–15.5)
WBC: 9.1 10*3/uL (ref 4.0–10.5)

## 2015-11-16 NOTE — Progress Notes (Signed)
Family Medicine Teaching Service Daily Progress Note Intern Pager: [phone]  Patient name: Ann [name] Medical record number: [id number] Date of birth: 1928/01/05 Age: 82 y.o. Gender: female  Primary Care Provider: Caryl Ada, DO Consultants: VVS, Ortho Code Status: Full  Pt Overview and Major Events to Date:  Admitted 11/10/15 with dry gangrene left leg L AKA 11/11/15  Assessment and Plan: Ann [name] is a 82 y.o. female who presented with cold, painful left LE, confusion, and concerns for UTI . PMH is significant for PAD s/p R BKA, dementia, DVT, PAF, HTN, and seizures. LKA performed 11/11/15. Patient had fevers yesterday afternoon/early evening. She was afebrile overnight. She continues to be hypotensive. Somnolent this AM.   Dry gangrene in setting of PAD, now s/p L AKA: patient with a h/o of R BKA with Dr. Lajoyce Corners on 10/26/13, presented with cold, painful left lower extremity. An aortogram in 10/2013 revealed occlusion of the popliteal and below on the right as well. She failed conservative management and had L AKA 11/11/15.  - Continue Lipitor - Patient is not indicating pain at AKA site- wound vac in place.  - Continue scheduled Tylenol 650 mg q8h  - Continue IV morphine1 mg q3h PRN pain  - On RA satting 92-95%.  - PT recommending SNF for discharge, daughter [name] in agreement - CSW consulted: working w/ family for placement options  Hypotension: improving - Continue MIVFs. Bolus as needed. - Solu-cortef day 3.  - continue to monitor and transfer to SDU if worsening.  - Continue to hold home amiodarone - Holding home coreg   Confusion: concern from family that the patient was more somnolent and less conversant over the 1-2 weeks. Neuro exam non-focal. U/A appears consistent with UTI with nitrite and leukocyte esterase.May also be a component of dehydration given poor PO intake recently. Patient has been minimally verbal. Bladder scan negative for retention. CT without acute  changes. Diminished stress response could also be contributing.  - treat UTI with ceftriaxone x 7 days (day 6) - hydrate with D5NS @ 100cc/hr - Up to chair - Palliative to re-addressing goals of care >> meeting set up for 3/13 @12 :30pm - Speech eval for swallow >> NPO with sips with meds. Sips if patient requests  Anemia, likely 2/2 acute (recent surgery) on chronic disease vs anticoagulation; Improving: Hgb 10.0 today (3/12) - Monitor  Dementia: High risk for delirium - continue home Namenda  Paroxysmal atrial fibrillation: currently in NSR.  - Continue home amiodarone  - Continue Xareltro  Constipation: Reported for approximately 2 weeks, though had BM 11/11/15. KUB without significant stool burden on my exam.  Decreased BMs could be secondary to poor PO intake. Abdominal exam unremarkable.  - continue MiraLax daily.   Seizures: No recent seizure like activity. - continue Keppra (made IV, as patient having trouble taking PO)  FEN/GI: NPO, D5NS @ 100cc/hr  Prophylaxis: xarelto  Disposition: admitted to inpatient, attending Dr. Jennette Kettle  Subjective:  Asleep. Does not answer questions. Easily woken w/ gentle shake, continued to call out for mother and moan.    Objective: Temp:  [97 F (36.1 C)-97.7 F (36.5 C)] 97 F (36.1 C) (03/12 1225) Pulse Rate:  [72-76] 72 (03/12 1225) Resp:  [16-18] 18 (03/12 1225) BP: (94-115)/(68-86) 115/86 mmHg (03/12 1225) SpO2:  [98 %] 98 % (03/12 1225) Physical Exam: General: [name], lying in bed  Cardiovascular: RRR. No murmurs appreciated. Strong radial pulses.  Chest: Lungs CTAB. No increased WOB. Abdomen: +BS, S, some  moaning with palpation of abdomen, ND Extremities: L AKA with wound vac in place. No drainage from wound vac. No TTP at stump or erythema.  Neuro: Moans and calls out but does not respond to questions. Eyes open but no eye contact.   Laboratory:  Recent Labs Lab 11/14/15 0420 11/15/15 1117 11/16/15 0537   WBC 10.2 11.8* 9.1  HGB 8.7* 9.1* 10.0*  HCT 27.1* 28.3* 30.3*  PLT 205 282 214    Recent Labs Lab 11/10/15 1220  11/12/15 0520 11/13/15 0512 11/15/15 0525  NA 140  < > 146* 144 149*  K 4.0  < > 4.2 3.9 3.8  CL 103  < > 115* 118* 125*  CO2 26  < > 20* 17* 15*  BUN 21*  < > 19 25* 24*  CREATININE 1.21*  < > 1.03* 1.30* 1.21*  CALCIUM 8.7*  < > 7.8* 7.8* 7.7*  PROT 7.4  --   --   --   --   BILITOT 3.3*  --   --   --   --   ALKPHOS 83  --   --   --   --   ALT 34  --   --   --   --   AST 117*  --   --   --   --   GLUCOSE 119*  < > 87 206* 232*  < > = values in this interval not displayed.  Imaging/Diagnostic Tests: Dg Abd 1 View  11/14/2015  CLINICAL DATA:  Abdominal pain, possible constipation EXAM: ABDOMEN - 1 VIEW COMPARISON:  None. FINDINGS: Scattered large and small bowel gas is noted. Mild fecal material is noted although no significant retention is seen. Degenerative changes of the lumbar spine are noted. Heavy vascular calcifications are seen. Diverticulosis is noted. IMPRESSION: No acute abnormality noted. Electronically Signed   By: Alcide Clever M.D.   On: 11/14/2015 14:21   Ct Head Wo Contrast  11/14/2015  CLINICAL DATA:  Altered mental status status post leg amputation, dementia EXAM: CT HEAD WITHOUT CONTRAST TECHNIQUE: Contiguous axial images were obtained from the base of the skull through the vertex without intravenous contrast. COMPARISON:  12/30/2014 FINDINGS: Motion degraded images. No evidence of parenchymal hemorrhage or extra-axial fluid collection. No mass lesion, mass effect, or midline shift. No CT evidence of acute infarction. Encephalomalacic changes in the right anterior temporal lobe, chronic. Extensive small vessel ischemic changes. Global cortical atrophy.  No ventriculomegaly. Chronic opacification of the right maxillary sinus. Mild mucosal thickening of the bilateral frontal, right ethmoid, and left maxillary sinuses. Mastoid air cells are clear. Prior  bi-frontotemporal craniectomy. No evidence of calvarial fracture. IMPRESSION: Motion degraded images. No evidence of acute intracranial abnormality. Encephalomalacic changes in the right anterior temporal lobe, chronic. Atrophy with extensive small vessel ischemic changes. Electronically Signed   By: Charline Bills M.D.   On: 11/14/2015 17:19    Kathee Delton, MD 11/16/2015, 3:08 PM PGY-2, [place] [facility] FPTS Intern pager: [phone], text pages welcome

## 2015-11-17 LAB — CBC
HEMATOCRIT: 27 % — AB (ref 36.0–46.0)
HEMOGLOBIN: 8.7 g/dL — AB (ref 12.0–15.0)
MCH: 26 pg (ref 26.0–34.0)
MCHC: 32.2 g/dL (ref 30.0–36.0)
MCV: 80.6 fL (ref 78.0–100.0)
Platelets: 276 10*3/uL (ref 150–400)
RBC: 3.35 MIL/uL — AB (ref 3.87–5.11)
RDW: 20.7 % — ABNORMAL HIGH (ref 11.5–15.5)
WBC: 9.2 10*3/uL (ref 4.0–10.5)

## 2015-11-17 LAB — GLUCOSE, CAPILLARY: GLUCOSE-CAPILLARY: 190 mg/dL — AB (ref 65–99)

## 2015-11-17 NOTE — Progress Notes (Signed)
Family Medicine Teaching Service Daily Progress Note Intern Pager: [phone]  Patient name: Ann [name] Medical record number: [id number] Date of birth: [birth year] Age: 82 y.o. Gender: female  Primary Care Provider: Caryl Ada, DO Consultants: VVS, Ortho Code Status: Full  Pt Overview and Major Events to Date:  Admitted 11/10/15 with dry gangrene left leg L AKA 11/11/15  Assessment and Plan: Ann [name] is a 82 y.o. female who presented with cold, painful left LE, confusion, and concerns for UTI . PMH is significant for PAD s/p R BKA, dementia, DVT, PAF, HTN, and seizures. LKA performed 11/11/15.  Dry gangrene in setting of PAD, now s/p L AKA with wound vac: patient with a h/o of R BKA with Dr. Lajoyce Corners on 10/26/13, presented with cold, painful left lower extremity. An aortogram in 10/2013 revealed occlusion of the popliteal and below on the right as well. She failed conservative management and had L AKA 11/11/15.  - Continue Lipitor - Patient is not indicating pain at AKA site- wound vac in place.  - Continue scheduled Tylenol 650 mg q8h  - will discontinue morphine as patient may not be in pain and may be just agitated; additionally do not want to contribute to agitation/delerium will monitor closely - PT recommending SNF for discharge, daughter [name] in agreement - CSW consulted: working w/ family for placement options  Hypotension: improving (101-115/63-86) - Continue MIVFs. Bolus as needed. - Solu-cortef day 3.  - continue to monitor and transfer to SDU if worsening.  - Holding home coreg and amlodipine   Confusion: concern from family that the patient was more somnolent and less conversant over the 1-2 weeks. Neuro exam non-focal. U/A appears consistent with UTI with nitrite and leukocyte esterase.May also be a component of dehydration given poor PO intake recently. Patient has been minimally verbal. Bladder scan negative for retention. CT without acute changes. Diminished stress  response could also be contributing.  - treat UTI with ceftriaxone x 7 days (day 7 today) - Up to chair - Palliative to re-addressing goals of care >> meeting set up for 3/13 @12 :30pm - will allow trial of regular diet today ; will discontinue IVF and closely monitor  Anemia, likely 2/2 acute (recent surgery) on chronic disease vs anticoagulation; Decreased today 8.7 10/13  (from 10). No signs of bleeding noted - will monitor closely  Dementia: High risk for delirium - continue home Namenda  Paroxysmal atrial fibrillation: currently in NSR.  - Continue home amiodarone  - Continue Xareltro  Constipation: Reported for approximately 2 weeks, though had BM 11/11/15. KUB without significant stool burden on my exam.  Decreased BMs could be secondary to poor PO intake. Abdominal exam unremarkable.  - continue MiraLax daily, but patient declining   Seizures: No recent seizure like activity. - continue Keppra (made IV, as patient having trouble taking PO)  FEN/GI: trial of regular diet, SLIV Prophylaxis: xarelto  Disposition: pending Palliative meeting to discuss GOC   Subjective:  Does not answer questions. Continued to call out for mother and moan.    Objective: Temp:  [97 F (36.1 C)-97.6 F (36.4 C)] 97.4 F (36.3 C) (03/13 0500) Pulse Rate:  [72-82] 78 (03/13 0500) Resp:  [16-18] 16 (03/13 0500) BP: (101-115)/(63-86) 101/63 mmHg (03/13 0500) SpO2:  [98 %-100 %] 100 % (03/13 0500) Physical Exam: General: [name], lying in bed  Cardiovascular: RRR. No murmurs appreciated.  Chest: Lungs CTAB. No increased WOB. Abdomen: +BS, S,, ND Extremities: L AKA with wound vac in  place. No drainage from wound vac. No TTP at stump or erythema.  Neuro: Moans and calls out but does not respond to questions. Eyes open but no eye contact.   Laboratory:  Recent Labs Lab 11/15/15 1117 11/16/15 0537 11/17/15 0414  WBC 11.8* 9.1 9.2  HGB 9.1* 10.0* 8.7*  HCT 28.3* 30.3* 27.0*   PLT 282 214 276    Recent Labs Lab 11/10/15 1220  11/12/15 0520 11/13/15 0512 11/15/15 0525  NA 140  < > 146* 144 149*  K 4.0  < > 4.2 3.9 3.8  CL 103  < > 115* 118* 125*  CO2 26  < > 20* 17* 15*  BUN 21*  < > 19 25* 24*  CREATININE 1.21*  < > 1.03* 1.30* 1.21*  CALCIUM 8.7*  < > 7.8* 7.8* 7.7*  PROT 7.4  --   --   --   --   BILITOT 3.3*  --   --   --   --   ALKPHOS 83  --   --   --   --   ALT 34  --   --   --   --   AST 117*  --   --   --   --   GLUCOSE 119*  < > 87 206* 232*  < > = values in this interval not displayed.  Imaging/Diagnostic Tests: Dg Abd 1 View  11/14/2015  CLINICAL DATA:  Abdominal pain, possible constipation EXAM: ABDOMEN - 1 VIEW COMPARISON:  None. FINDINGS: Scattered large and small bowel gas is noted. Mild fecal material is noted although no significant retention is seen. Degenerative changes of the lumbar spine are noted. Heavy vascular calcifications are seen. Diverticulosis is noted. IMPRESSION: No acute abnormality noted. Electronically Signed   By: Alcide Clever M.D.   On: 11/14/2015 14:21   Ct Head Wo Contrast  11/14/2015  CLINICAL DATA:  Altered mental status status post leg amputation, dementia EXAM: CT HEAD WITHOUT CONTRAST TECHNIQUE: Contiguous axial images were obtained from the base of the skull through the vertex without intravenous contrast. COMPARISON:  12/30/2014 FINDINGS: Motion degraded images. No evidence of parenchymal hemorrhage or extra-axial fluid collection. No mass lesion, mass effect, or midline shift. No CT evidence of acute infarction. Encephalomalacic changes in the right anterior temporal lobe, chronic. Extensive small vessel ischemic changes. Global cortical atrophy.  No ventriculomegaly. Chronic opacification of the right maxillary sinus. Mild mucosal thickening of the bilateral frontal, right ethmoid, and left maxillary sinuses. Mastoid air cells are clear. Prior bi-frontotemporal craniectomy. No evidence of calvarial fracture.  IMPRESSION: Motion degraded images. No evidence of acute intracranial abnormality. Encephalomalacic changes in the right anterior temporal lobe, chronic. Atrophy with extensive small vessel ischemic changes. Electronically Signed   By: Charline Bills M.D.   On: 11/14/2015 17:19    Palma Holter, MD 11/17/2015, 6:53 AM PGY-1, [place] [facility] FPTS Intern pager: [phone], text pages welcome

## 2015-11-17 NOTE — Progress Notes (Signed)
Physical Therapy Treatment Patient Details Name: Ann [name] MRN: [id number] DOB: 24-Dec-1927 Today's Date: 11/17/2015    History of Present Illness Ann [name] is a 82 y.o. female presenting with cold, painful left LE, confusion, and concerns for UTI . PMH is significant for PAD s/p R BKA, dementia, DVT, PAF, HTN, and seizures. Pt s/p L AKA 3/7 with wound vac placement.    PT Comments    More alert with today's session, and better able to participate, though still with limited participation; Unable to follow commands consistently; continue to recommend the level of care provided at SNF; will follow Palliative Care's lead re: goals of care   Follow Up Recommendations  SNF;Supervision/Assistance - 24 hour     Equipment Recommendations  None recommended by PT    Recommendations for Other Services       Precautions / Restrictions Precautions Precautions: Fall (bilat LE amputee)    Mobility  Bed Mobility Overal bed mobility: Needs Assistance;+2 for physical assistance Bed Mobility: Supine to Sit;Sit to Supine     Supine to sit: Max assist;+2 for physical assistance Sit to supine: Max assist;+2 for physical assistance   General bed mobility comments: Noting some following of commands with increased time, though inconsistent; better ability to interact with her environment; Turned head on cue and helped with initiation of semi-roll and pushing up to sit; still requiring max assist  Transfers Overall transfer level: Needs assistance Equipment used:  (bed pad) Transfers: Lateral/Scoot Transfers          Lateral/Scoot Transfers: +2 physical assistance;Total assist General transfer comment: Simulated lateral scoot transfer to reposition higher towards HOB; required R knee block and scoot on bed pad; verbal and tactile cueing to weight shift off of hips for scooting  Ambulation/Gait                 Stairs            Wheelchair Mobility    Modified Rankin  (Stroke Patients Only)       Balance     Sitting balance-Leahy Scale: Poor (but improving) Sitting balance - Comments: Sat EOB at least 5 minutes, facing window; briefly worked on trunk extension and anterior pelvic tilt in sitting; able to keep balance for very brief moments while sitting EOB with very close guard for safety                            Cognition Arousal/Alertness: Awake/alert Behavior During Therapy: Flat affect Overall Cognitive Status: History of cognitive impairments - at baseline (pt with dementia)                      Exercises      General Comments        Pertinent Vitals/Pain Pain Assessment: Faces Faces Pain Scale: Hurts even more Pain Location: L residual limb, with movement or contact; no signs of pain at rest Pain Descriptors / Indicators: Grimacing Pain Intervention(s): Monitored during session    Home Living                      Prior Function            PT Goals (current goals can now be found in the care plan section) Acute Rehab PT Goals Patient Stated Goal: didn't report PT Goal Formulation: Patient unable to participate in goal setting Time For Goal Achievement: 11/19/15 Potential to Achieve  Goals: Fair Progress towards PT goals: Progressing toward goals (more alert today)    Frequency  Min 1X/week    PT Plan Current plan remains appropriate    Co-evaluation             End of Session Equipment Utilized During Treatment:  (bed pads to cradle hips during transitions) Activity Tolerance: Patient tolerated treatment well Patient left: in bed;with call bell/phone within reach;with bed alarm set (bed in semi-chair position)     Time: 1021-1173 PT Time Calculation (min) (ACUTE ONLY): 18 min  Charges:  $Therapeutic Activity: 8-22 mins                    G Codes:      Olen Pel 11/17/2015, 1:21 PM  Van Clines, [place]  Acute Rehabilitation Services Pager [phone] Office  [phone]

## 2015-11-17 NOTE — Progress Notes (Signed)
Speech Language Pathology Treatment: Dysphagia  Patient Details Name: Ann [name] MRN: [id number] DOB: May 06, 1928 Today's Date: 11/17/2015 Time: 4332-9518 SLP Time Calculation (min) (ACUTE ONLY): 18 min  Assessment / Plan / Recommendation Clinical Impression  ST follow up for PO readiness.  Per notes the patient began asking for things to drink today.  She is more alert and interactive.  Chart review revealed that lungs are diminished and she is afebrile.  The patient's daughter was present feeding the patient her lunch tray.  The patient was very slow orally to prepare chopped meats (i.e. Stew beef) but left no oral residue.  Swallow trigger given liquids was timely and hyo-laryngeal excursion was judged to be adequate.  The patient's daughter reported that at home the patient is normally able to feed herself and that due to her current illness she has not been able to feed herself.  There was a significant amount of belching with intake, almost after every swallow.  This has been an ongoing issue and suggests esophageal dysfunction.  Overt s/s of aspiration were not seen at bedside.  Recommend a dysphagia 3 diet with thin liquids with aspiration precautions as posted at the head of the bed.  ST will follow up to ensure toleration of the diet.       HPI: 82 y.o. female who presented with cold, painful left LE, confusion, and concerns for UTI . PMH is significant for PAD s/p R BKA, dementia, DVT, PAF, HTN, and seizures. LKA performed 11/11/15.  Pt with somnolence, poor PO intake.  Palliative Medicine involve to assist family with GOC.      SLP Plan  Goals updated     Recommendations  Diet recommendations: Dysphagia 3 (mechanical soft);Thin liquid Liquids provided via: Cup;Straw Medication Administration: Whole meds with puree Supervision: Trained caregiver to feed patient Compensations: Slow rate;Small sips/bites;Minimize environmental distractions Postural Changes and/or Swallow Maneuvers:  Seated upright 90 degrees;Upright 30-60 min after meal             Oral Care Recommendations: Oral care QID Follow up Recommendations:  (TBD) Plan: Goals updated     GO               Dimas Aguas, MA, CCC-SLP Acute Rehab SLP [phone] Ann [name] 11/17/2015, 2:12 PM

## 2015-11-17 NOTE — Consult Note (Signed)
   [facility] CM Inpatient Consult   11/17/2015  Ann [name] [birth year] [id number] Patient has been screened for [facility] Care Management services as an eligible service for this patient.  However, after careful chart review of progress notes of the social worker and Palliative care team, the patient is likely most appropriate for Hospice and Palliative care with the disposition to be determined for discharge per social worker notes and Palliative care team notes.  [facility] Care Management services does not seem appropriate at this time.  Please make a referral if this plan changes and a need for care management with [facility].  Please contact: Charlesetta Shanks, RN BSN CCM [facility]  [phone] business mobile phone Toll free office [phone]

## 2015-11-17 NOTE — Progress Notes (Signed)
Palliative Medicine RN Note: Rec'd call from RN Pattricia Boss that family has arrived for the family meeting that was scheduled for 1230. No provider available to meet this late in the day; family agreed to be in the room at 1400 tomorrow, after her daughter finishes her shift. Donn Pierini, RN, BSN, [facility] 11/17/2015 4:12 PM Cell [phone] 8:00-4:00 Monday-Friday Office [phone]

## 2015-11-17 NOTE — Progress Notes (Addendum)
Patient ID: Ann [name], female   DOB: 1928-03-17, 82 y.o.   MRN: [id number] Postoperative day 6 left AKA, patient being evaluated for hospice, wound vac functioning well.

## 2015-11-17 NOTE — Care Management Important Message (Signed)
Important Message  Patient Details  Name: SAMMY DARRAS MRN: [id number] Date of Birth: Apr 01, 1928   Medicare Important Message Given:  Yes    Clotilde Loth P Karmen Altamirano 11/17/2015, 12:16 PM

## 2015-11-17 NOTE — Progress Notes (Signed)
Palliative Medicine RN Note: Meeting was set for 1230 today. RN waited for 20 minutes on the floor/in the room; family did not show up. Provided contact information in case they show up later. Of note, the pt has had a significant appetite increase per RN. PMT RN helped pt drink 2 cups of cold water and some Sprite.  Donn Pierini, RN, BSN, [facility] 11/17/2015 1:33 PM Cell [phone] 8:00-4:00 Monday-Friday Office [phone]

## 2015-11-17 NOTE — Progress Notes (Signed)
Nutrition Follow-up   INTERVENTION:  Continue Ensure Enlive po BID, each supplement provides 350 kcal and 20 grams of protein Provide Magic Cup ice cream once daily with dinner, provides 290 kcal and 9 grams of protein   NUTRITION DIAGNOSIS:   Increased nutrient needs related to wound healing as evidenced by estimated needs.  ongoing  GOAL:   Patient will meet greater than or equal to 90% of their needs  Unmet  MONITOR:   Diet advancement, Weight trends, Labs  REASON FOR ASSESSMENT:   Low Braden    ASSESSMENT:   Pt presenting with cold, painful left LE, confusion, and concerns for UTI . PMH is significant for PAD s/p R BKA, dementia, DVT, PAF, HTN, and seizures  Pt was made NPO on 3/11 and diet was advanced later this morning. Pt finishing up eating lunch at time of visit; asking her daughter for juice. Daughter reports that patient ate well; meal tray at bedside with ~20% consumed. Patient has not been receiving Ensure due to NPO and concern for aspiration. Daughter states that patient likes Ensure.   Labs: low hemoglobin, glucose ranging 165 to 230 mg/dL  Diet Order:  DIET DYS 3 Room service appropriate?: Yes; Fluid consistency:: Thin  Skin:  Wound (see comment) (stage II pressure ulcer on buttocks)  Last BM:  3/8  Height:   Ht Readings from Last 1 Encounters:  11/11/15 5\' 1"  (1.549 m)    Weight:   Wt Readings from Last 1 Encounters:  11/14/15 133 lb (60.328 kg)    Ideal Body Weight:  44.5 kg (-6.5% for R BKA )  BMI:  Body mass index is 25.14 kg/(m^2).  Estimated Nutritional Needs:   Kcal:  1400-1600   Protein:  80-95 grams (1.3g/kg)   Fluid:  1.4-1.6 L   EDUCATION NEEDS:   No education needs identified at this time  Dorothea Ogle RD, LDN Inpatient Clinical Dietitian Pager: [phone] After Hours Pager: [phone]

## 2015-11-18 LAB — GLUCOSE, CAPILLARY
GLUCOSE-CAPILLARY: 186 mg/dL — AB (ref 65–99)
GLUCOSE-CAPILLARY: 157 mg/dL — AB (ref 65–99)
Glucose-Capillary: 113 mg/dL — ABNORMAL HIGH (ref 65–99)

## 2015-11-18 LAB — BASIC METABOLIC PANEL
Anion gap: 7 (ref 5–15)
BUN: 20 mg/dL (ref 6–20)
CALCIUM: 7.4 mg/dL — AB (ref 8.9–10.3)
CO2: 20 mmol/L — ABNORMAL LOW (ref 22–32)
CREATININE: 1.09 mg/dL — AB (ref 0.44–1.00)
Chloride: 122 mmol/L — ABNORMAL HIGH (ref 101–111)
GFR calc Af Amer: 52 mL/min — ABNORMAL LOW (ref >60)
GFR, EST NON AFRICAN AMERICAN: 45 mL/min — AB (ref >60)
GLUCOSE: 135 mg/dL — AB (ref 65–99)
Potassium: 3.4 mmol/L — ABNORMAL LOW (ref 3.5–5.1)
Sodium: 149 mmol/L — ABNORMAL HIGH (ref 135–145)

## 2015-11-18 LAB — CBC
HCT: 28 % — ABNORMAL LOW (ref 36.0–46.0)
Hemoglobin: 9.3 g/dL — ABNORMAL LOW (ref 12.0–15.0)
MCH: 26.4 pg (ref 26.0–34.0)
MCHC: 33.2 g/dL (ref 30.0–36.0)
MCV: 79.5 fL (ref 78.0–100.0)
PLATELETS: 271 10*3/uL (ref 150–400)
RBC: 3.52 MIL/uL — ABNORMAL LOW (ref 3.87–5.11)
RDW: 20.5 % — AB (ref 11.5–15.5)
WBC: 9.5 10*3/uL (ref 4.0–10.5)

## 2015-11-18 MED ORDER — IBUPROFEN 200 MG PO TABS
200.0000 mg | ORAL_TABLET | Freq: Four times a day (QID) | ORAL | Status: DC
  Administered 2015-11-18 – 2015-11-19 (×5): 200 mg via ORAL
  Filled 2015-11-18 (×7): qty 1

## 2015-11-18 MED ORDER — HYDROCORTISONE NA SUCCINATE PF 100 MG IJ SOLR: 50.0000 mg | Freq: Two times a day (BID) | INTRAMUSCULAR | Status: DC

## 2015-11-18 MED ORDER — ACETAMINOPHEN 650 MG RE SUPP
650.0000 mg | Freq: Three times a day (TID) | RECTAL | Status: DC
Start: 2015-11-18 — End: 2015-11-19

## 2015-11-18 MED ORDER — PANTOPRAZOLE SODIUM 40 MG PO TBEC
40.0000 mg | DELAYED_RELEASE_TABLET | Freq: Every day | ORAL | Status: DC
  Administered 2015-11-18 – 2015-11-19 (×2): 40 mg via ORAL
  Filled 2015-11-18 (×2): qty 1

## 2015-11-18 MED ORDER — LEVETIRACETAM 500 MG PO TABS
500.0000 mg | ORAL_TABLET | Freq: Two times a day (BID) | ORAL | Status: DC
  Administered 2015-11-18 – 2015-11-19 (×3): 500 mg via ORAL
  Filled 2015-11-18 (×3): qty 1

## 2015-11-18 MED ORDER — ACETAMINOPHEN 325 MG PO TABS
650.0000 mg | ORAL_TABLET | Freq: Three times a day (TID) | ORAL | Status: DC
  Administered 2015-11-18 – 2015-11-19 (×5): 650 mg via ORAL
  Filled 2015-11-18 (×5): qty 2

## 2015-11-18 NOTE — Progress Notes (Signed)
Speech Language Pathology Treatment: Dysphagia  Patient Details Name: Ann [name] MRN: [id number] DOB: [birth year] Today's Date: 11/18/2015 Time: 3612-2449 SLP Time Calculation (min) (ACUTE ONLY): 16 min  Assessment / Plan / Recommendation Clinical Impression  Pt is tolerating dysphagia 3, thin liquid diet well.  She is more alert, asking for coffee, able to feed herself food/drinks with good attention to POs and no s/s of aspiration.  She has ongoing s/s of esophageal issues, chronic in nature, which was discussed with Beulah, her dtr.  Pt appears to be back at baseline level of function - historically, her dysphagia tends to resolve as MS and overall medical condition improves.  No further SLP needs identified at this time - our services will sign off.    HPI HPI: 82 y.o. female who presented with cold, painful left LE, confusion, and concerns for UTI . PMH is significant for PAD s/p R BKA, dementia, DVT, PAF, HTN, and seizures. LKA performed 11/11/15.  Pt with somnolence, poor PO intake.  Palliative Medicine involve to assist family with [place].      SLP Plan  All goals met     Recommendations  Diet recommendations: Dysphagia 3 (mechanical soft);Thin liquid Liquids provided via: Cup;Straw Medication Administration: Whole meds with puree Supervision: Patient able to self feed;Staff to assist with self feeding Compensations: Slow rate;Small sips/bites Postural Changes and/or Swallow Maneuvers: Seated upright 90 degrees             Oral Care Recommendations: Oral care QID Follow up Recommendations: None Plan: All goals met     GO                Juan Quam Laurice 11/18/2015, 2:54 PM

## 2015-11-18 NOTE — Progress Notes (Signed)
Family Medicine Teaching Service Daily Progress Note Intern Pager: [phone]  Patient name: Ann [name] Medical record number: [id number] Date of birth: 1927-11-06 Age: 82 y.o. Gender: female  Primary Care Provider: Caryl Ada, DO Consultants: VVS, Ortho Code Status: Full  Pt Overview and Major Events to Date:  Admitted 11/10/15 with dry gangrene left leg L AKA 11/11/15  Assessment and Plan: Ann [name] is a 82 y.o. female who presented with cold, painful left LE, confusion, and concerns for UTI . PMH is significant for PAD s/p R BKA, dementia, DVT, PAF, HTN, and seizures. LKA performed 11/11/15.  Dry gangrene in setting of PAD, now s/p L AKA with wound vac: patient with a h/o of R BKA with Dr. Lajoyce Corners on 10/26/13, presented with cold, painful left lower extremity. An aortogram in 10/2013 revealed occlusion of the popliteal and below on the right as well. She failed conservative management and had L AKA 11/11/15.  - Continue Lipitor - Patient indicates pain at AKA site when moved- wound vac in place.  - Continue scheduled Tylenol 650 mg q8h (PO or rectal suppository) - Add scheduled ibuprofen 200 mg q6h - Discontinued scheduled morphine 11/17/15, as patient may not be in pain and may be just agitated; do not want to contribute to agitation/delerium, will monitor closely - PT recommending SNF for discharge, daughter [name] in agreement - CSW consulted: working w/ family for placement options  Hypotension: improving, 113/83-143/88 - Bolus as needed. - Solu-cortef day 4. Decrease dose to BID for q6h. Then once tomorrow, 3/15, then d/c.  - Added protonix for gut protection with steroid use - continue to monitor and transfer to SDU if worsening.  - Holding home coreg and amlodipine   Confusion: concern from family that the patient was more somnolent and less conversant over the 1-2 weeks. Neuro exam non-focal. U/A appears consistent with UTI with nitrite and leukocyte esterase.May also be a  component of dehydration given poor PO intake recently. Patient has been minimally verbal. Bladder scan negative for retention. CT without acute changes. Diminished stress response could also be contributing.  - Completed treatment of UTI with ceftriaxone x 7 days on 11/17/15 - Up to chair - Palliative to re-addressing goals of care >> meeting set up for 3/14 :00 - Dysphagia 3 diet started and IVFs stopped 11/17/15; restart IVFs if poor PO continues  Anemia, likely 2/2 acute (recent surgery) on chronic disease vs anticoagulation; stable/increased at 9.3 today 11/18/15 (from 8.7 yesterday, 12.5 on admission). No signs of bleeding noted - will monitor closely  Dementia: High risk for delirium - continue home Namenda  Paroxysmal atrial fibrillation: currently in NSR.  - Continue home amiodarone  - Continue Xareltro  Constipation: Reported for approximately 2 weeks, though had BM 11/11/15. KUB without significant stool burden on my exam.  Decreased BMs could be secondary to poor PO intake. Abdominal exam unremarkable.  - Continue MiraLax daily, though patient has not been able to take most doses  Seizures: No recent seizure like activity. - continue Keppra (switched back to PO)  FEN/GI: Dysphagia 3 diet with ensure ordered, protonix, SLIV Prophylaxis: xarelto  Disposition: Pending Palliative meeting to discuss GOC   Subjective:  Answered some questions. Able to ask for drink at bedside. Complained of hands being cold and pain in her leg when touched.   Objective: Pulse Rate:  [89] 89 (03/13 2239) BP: (143)/(88) 143/88 mmHg (03/13 2239) Physical Exam: General: Chronically ill appearing woman, sitting up in bed awake Cardiovascular: RRR.  No murmurs appreciated.  Chest: Lungs CTAB. No increased WOB. Abdomen: +BS, S, ND Extremities: L AKA with wound vac in place. No drainage from wound vac. No TTP at stump or erythema. Hands slightly edematous. Neuro: Oriented to self and general place  (hospital) but not time. Speech intermittently intelligible (also edentulous)   Laboratory:  Recent Labs Lab 11/16/15 0537 11/17/15 0414 11/18/15 0443  WBC 9.1 9.2 9.5  HGB 10.0* 8.7* 9.3*  HCT 30.3* 27.0* 28.0*  PLT 214 276 271    Recent Labs Lab 11/12/15 0520 11/13/15 0512 11/15/15 0525  NA 146* 144 149*  K 4.2 3.9 3.8  CL 115* 118* 125*  CO2 20* 17* 15*  BUN 19 25* 24*  CREATININE 1.03* 1.30* 1.21*  CALCIUM 7.8* 7.8* 7.7*  GLUCOSE 87 206* 232*    Imaging/Diagnostic Tests: Dg Abd 1 View  11/14/2015  CLINICAL DATA:  Abdominal pain, possible constipation EXAM: ABDOMEN - 1 VIEW COMPARISON:  None. FINDINGS: Scattered large and small bowel gas is noted. Mild fecal material is noted although no significant retention is seen. Degenerative changes of the lumbar spine are noted. Heavy vascular calcifications are seen. Diverticulosis is noted. IMPRESSION: No acute abnormality noted. Electronically Signed   By: Alcide Clever M.D.   On: 11/14/2015 14:21   Ct Head Wo Contrast  11/14/2015  CLINICAL DATA:  Altered mental status status post leg amputation, dementia EXAM: CT HEAD WITHOUT CONTRAST TECHNIQUE: Contiguous axial images were obtained from the base of the skull through the vertex without intravenous contrast. COMPARISON:  12/30/2014 FINDINGS: Motion degraded images. No evidence of parenchymal hemorrhage or extra-axial fluid collection. No mass lesion, mass effect, or midline shift. No CT evidence of acute infarction. Encephalomalacic changes in the right anterior temporal lobe, chronic. Extensive small vessel ischemic changes. Global cortical atrophy.  No ventriculomegaly. Chronic opacification of the right maxillary sinus. Mild mucosal thickening of the bilateral frontal, right ethmoid, and left maxillary sinuses. Mastoid air cells are clear. Prior bi-frontotemporal craniectomy. No evidence of calvarial fracture. IMPRESSION: Motion degraded images. No evidence of acute intracranial  abnormality. Encephalomalacic changes in the right anterior temporal lobe, chronic. Atrophy with extensive small vessel ischemic changes. Electronically Signed   By: Charline Bills M.D.   On: 11/14/2015 17:19    Hillary Percell [place], MD 11/18/2015, 5:55 AM PGY-1, [facility] Family Medicine FPTS Intern pager: [phone], text pages welcome

## 2015-11-18 NOTE — Care Management Note (Addendum)
Case Management Note  Patient Details  Name: Ann [name] MRN: [id number] Date of Birth: 1927/09/10  Subjective/Objective:     Pt s/p AKA               Action/Plan:  Pt is from home, palliative care consulted.  Daughter Ann [name] has decided to take mother home with hospice instead of discharge to SNF.  Per daughter pt is at baseline with mobility.  Pt already has hospital bed, 3:1, and sliding board in the home, daughter will provide 24 care once discharged home.  CM offered home hospice choice, daughter chose [facility].  CM contacted agency and verified that agency will accept wound vac.  Referral taken by Dois Davenport, CM waiting on call back from intake worker for referral acceptance.  Family asking for suction set up, bed pan and overlay mattress for the home.  CM spoke with attending; iterative discharge tomorrow 11/19/15.   Expected Discharge Date:                  Expected Discharge Plan:  Home w Home Health Services  In-House Referral:  NA  Discharge planning Services  CM Consult  Post Acute Care Choice:  Home Health Choice offered to:  Adult Children  DME Arranged:  N/A DME Agency:  NA  HH Arranged:  RN HH Agency:  [facility]  Status of Service:  In process, will continue to follow  Medicare Important Message Given:  Yes Date Medicare IM Given:    Medicare IM give by:    Date Additional Medicare IM Given:    Additional Medicare Important Message give by:     If discussed at Long Length of Stay Meetings, dates discussed:    Additional Comments:  Cherylann Parr, RN 11/18/2015, 4:32 PM

## 2015-11-18 NOTE — Progress Notes (Signed)
CSW went back to speak with patient and daughter. Per Daughter Francoise Ceo, the family has decided to take her home with home health. No further CSW needs were requested at this time. CSW to sign off.   Please consult if further CSW needs arise.   Fernande Boyden, LCSWA Clinical Social Worker [facility] Ph: [phone]

## 2015-11-18 NOTE — Progress Notes (Signed)
Palliative Medicine RN Note: Met with daughter Ann [name]. She agrees that SNF would be good for her mom for therapy and wound vac care, but she does not want pt to stay there long term. Family plans to bring Ann [name] home after she is done at [facility]; they have 24 hour care with family members. Placed order for SNF referral per this conversation; spoke with Glean Hess to provide update. Larina Earthly, RN, BSN, [facility] 11/18/2015 2:59 PM Cell [phone] 8:00-4:00 Monday-Friday Office [phone]

## 2015-11-18 NOTE — Progress Notes (Signed)
Patient has accepted bed offer at [facility]. Facility has been informed of patient and family's selection. CSW informed daughter that CSW will arrange transportation for patient to arrive to facility once ready. CSW to inform patient and family of transfer time. Patient and family appreciative of CSW services.   CSW will continue to follow and provide support to patient while in hospital.   Fernande Boyden, [facility] Clinical Social Worker [facility] Ph: [phone]

## 2015-11-18 NOTE — Progress Notes (Signed)
Palliative Medicine Follow up RN Note: Rec'd call from pt's granddaughter Ann [name]. She verbalized frustration at plan to go to SNF, stating pt has failed rehab twice before and that pt started crying when told she was going back. She feels strongly that pt could go home if appropriate equipment and training is in place. Discussed role of daughter Ann [name] as Management consultant; she states that her mother Ann [name]) agrees with this. PMT RN visited room again. Ann [name] states she no longer wants pt to go to SNF and that she prefers to go home w hospice if possible. If hospice cannot accommodate wound vac, family would accept [facility] with equipment. Adjusted d/c planning orders as requested after discussion with PMT DO.  Donn Pierini, RN, BSN, [facility] 11/18/2015 3:52 PM Cell [phone] 8:00-4:00 Monday-Friday Office [phone]

## 2015-11-19 ENCOUNTER — Ambulatory Visit: Payer: Medicare Other | Admitting: Obstetrics and Gynecology

## 2015-11-19 ENCOUNTER — Telehealth: Payer: Self-pay | Admitting: Obstetrics and Gynecology

## 2015-11-19 LAB — BASIC METABOLIC PANEL WITH GFR
Anion gap: 8 (ref 5–15)
BUN: 22 mg/dL — ABNORMAL HIGH (ref 6–20)
CO2: 21 mmol/L — ABNORMAL LOW (ref 22–32)
Calcium: 7.4 mg/dL — ABNORMAL LOW (ref 8.9–10.3)
Chloride: 114 mmol/L — ABNORMAL HIGH (ref 101–111)
Creatinine, Ser: 1.05 mg/dL — ABNORMAL HIGH (ref 0.44–1.00)
GFR calc Af Amer: 55 mL/min — ABNORMAL LOW
GFR calc non Af Amer: 47 mL/min — ABNORMAL LOW
Glucose, Bld: 147 mg/dL — ABNORMAL HIGH (ref 65–99)
Potassium: 3.8 mmol/L (ref 3.5–5.1)
Sodium: 143 mmol/L (ref 135–145)

## 2015-11-19 LAB — GLUCOSE, CAPILLARY: Glucose-Capillary: 116 mg/dL — ABNORMAL HIGH (ref 65–99)

## 2015-11-19 MED ORDER — IBUPROFEN 200 MG PO TABS: 200.0000 mg | ORAL_TABLET | Freq: Four times a day (QID) | ORAL | Status: DC | PRN

## 2015-11-19 MED ORDER — ONDANSETRON HCL 4 MG PO TABS: 4.0000 mg | ORAL_TABLET | Freq: Four times a day (QID) | ORAL | Status: DC | PRN

## 2015-11-19 MED ORDER — HYDROCORTISONE NA SUCCINATE PF 100 MG IJ SOLR: 50.0000 mg | Freq: Two times a day (BID) | INTRAMUSCULAR | Status: DC

## 2015-11-19 MED ORDER — ACETAMINOPHEN 325 MG PO TABS: 650.0000 mg | ORAL_TABLET | Freq: Three times a day (TID) | ORAL | Status: DC | PRN

## 2015-11-19 MED ORDER — ATORVASTATIN CALCIUM 20 MG PO TABS: 20.0000 mg | ORAL_TABLET | Freq: Every day | ORAL | Status: DC

## 2015-11-19 MED ORDER — ADULT MULTIVITAMIN W/MINERALS CH: 1.0000 | ORAL_TABLET | Freq: Every day | ORAL | Status: DC

## 2015-11-19 MED ORDER — ENSURE ENLIVE PO LIQD: 237.0000 mL | Freq: Two times a day (BID) | ORAL | Status: AC

## 2015-11-19 MED ORDER — METHOCARBAMOL 500 MG PO TABS: 500.0000 mg | ORAL_TABLET | Freq: Four times a day (QID) | ORAL | Status: DC | PRN

## 2015-11-19 MED FILL — ONDANSETRON HCL 4 MG TABLET: 4 | 5 days supply | Qty: 20 | Fill #0

## 2015-11-19 MED FILL — METHOCARBAMOL 500 MG TABLET: 500 | 15 days supply | Qty: 60 | Fill #0

## 2015-11-19 NOTE — Progress Notes (Signed)
[employer] Home Wound Vac in room returned to D[employer] room and Ricki with [employer] Notified that pt will NOT need this at home. CM called [phone] and gave them pt demographic info as well as Serial # of Machine in room. [id number]     [phone]   [id number]  Lot# [id number]    Model [id number].

## 2015-11-19 NOTE — Telephone Encounter (Signed)
Spoke to [place]. Will be willing to still be PCP and attending doctor for Ann [name]. Orders will be sent to me to sign-off on. They will handle symptom management as they see fit.

## 2015-11-19 NOTE — Telephone Encounter (Signed)
Patient is due in today for an appt and will discuss with family then also. Jazmin Hartsell,CMA

## 2015-11-19 NOTE — Progress Notes (Signed)
Notified by Arnetha Massy of family request for [facility] services at home after discharge. Chart and patient information currently under review to confirm hospice eligibility.   Spoke with [name], via phone conversation to initiate education related to hospice philosophy, services and team approach to care. Family verbalized understanding of the information provided. Per discussion, plan is for discharge to home by [facility] today.   Please send signed completed DNR form home with patient.  .  DME needs discussed and family requested suction and a hoyer lift for delivery to the home today.  [facility] equipment manager Jewel Kizzie Bane notified and will contact [facility] to arrange delivery to the home.  The home address has been verified and is correct in the chart; Beulah family member to be contacted to arrange time of delivery.   [facility] aware of the above.  Completed discharge summary will need to be faxed to [facility] at [phone] when final.  Please notify [facility] when patient is ready to leave unit at discharge-call [phone].   [facility] information and contact numbers have been given to [facility].   Please call with any questions.  Thank You,  Hessie Knows RN, BSN  [facility] Liaison  [phone]

## 2015-11-19 NOTE — Progress Notes (Signed)
Called to evaluate patient prior to EMS transport for discharge for "weeping" of the left UE. Reported that she began to have drainage after BP cuff was placed in that area. On exam, patient with small amounts of clear fluid drainage from the left antecubital fossa. Patient's daughter reports that the IV site was in that arm during hospitalization until it was removed this AM. No erythema or increased warmth to the area. Do not believe this poses a barrier to discharge. Patient is being transported to home with hospice and home health services s/p left AKA. Discussed potential signs of infection with daughter of patient and asked her to have [facility] services check on the area of concern. Asked RN to place some guaze with wrap for pressure prior to discharge.   Marcy Siren, D.O. 11/19/2015, 7:10 PM PGY-1, [facility] Family Medicine

## 2015-11-19 NOTE — Progress Notes (Signed)
Family Medicine Teaching Service Daily Progress Note Intern Pager: [phone]  Patient name: Ann [name] Medical record number: [id number] Date of birth: 04-03-1928 Age: 82 y.o. Gender: female  Primary Care Provider: Caryl Ada, DO Consultants: V[facility], Ortho Code Status: Full  Pt Overview and Major Events to Date:  Admitted 11/10/15 with dry gangrene left leg L AKA 11/11/15  Assessment and Plan: Ann [name] is a 82 y.o. female who presented with cold, painful left LE, confusion, and concerns for UTI . PMH is significant for PAD s/p R BKA, dementia, DVT, PAF, HTN, and seizures. LKA performed 11/11/15.  Dry gangrene in setting of PAD, now s/p L AKA with wound vac: patient with a h/o of R BKA with Dr. Lajoyce Corners on 10/26/13, presented with cold, painful left lower extremity. She failed conservative management and had L AKA 11/11/15.  - Continue Lipitor - Continue scheduled Tylenol 650 mg q8h  - Continue scheduled ibuprofen 200 mg q6h - PT recommended SNF for discharge but family preference is for home with hospice and HH - Discontinue wound vac today (has been on 3/7 >> 3/15 with minimal drainage, satisfying week of therapy recommended by Dr. Lajoyce Corners)  Hypotension: improving, systolic has been persistently above 90 - Bolus as needed. - s/p 4 days solu-cortef day. D/C as lost IV and BP improved.  - Added protonix for gut protection with steroid use - Holding home coreg and amlodipine   Confusion: concern from family that the patient was more somnolent and less conversant over the 1-2 weeks. Neuro exam non-focal. U/A appears consistent with UTI with nitrite and leukocyte esterase.May also be a component of dehydration given poor PO intake recently. Patient has been minimally verbal. Bladder scan negative for retention. CT 11/14/15 without acute changes. Diminished stress response could also be contributing.  - Completed treatment of UTI with ceftriaxone x 7 days on 11/17/15 - Palliative to re-addressing  goals of care >> family decided home with [facility], hospice and DME on 11/18/15  Poor PO: Improved with 1 L intake yesterday, 11/18/15.  - Dysphagia 3 diet started and IVFs stopped 11/17/15  Anemia, likely 2/2 acute (recent surgery) on chronic disease vs anticoagulation; stable/increased at 9.3 yesterday 11/18/15 No signs of bleeding noted. Minimal drainage from wound vac.  - will monitor closely  Dementia: High risk for delirium - continue home Namenda  Paroxysmal atrial fibrillation: currently in NSR.  - Continue home amiodarone  - Continue Xareltro  Constipation: Reported for approximately 2 weeks, though had BM 11/11/15. KUB without significant stool burden.  Decreased BMs could be secondary to poor PO intake. Abdominal exam unremarkable.  - Continue MiraLax daily  Seizures: No recent seizure like activity. - continue Keppra (switched back to PO)  FEN/GI: Dysphagia 3 diet with ensure ordered, protonix, SLIV Prophylaxis: xarelto  Disposition: Pending Palliative meeting to discuss GOC   Subjective:  Kept asking for orange juice and did not respond to questioning about pain.  Objective: Temp:  [97.3 F (36.3 C)-98.1 F (36.7 C)] 98.1 F (36.7 C) (03/15 0602) Pulse Rate:  [70-83] 83 (03/15 0602) Resp:  [18] 18 (03/15 0602) BP: (95-102)/(73-77) 102/74 mmHg (03/15 0602) SpO2:  [93 %-98 %] 93 % (03/15 0602) Weight:  [143 lb 9.6 oz (65.137 kg)] 143 lb 9.6 oz (65.137 kg) (03/15 0602) Physical Exam: General: Chronically ill appearing woman, sitting up in bed awake Cardiovascular: RRR. No murmurs appreciated.  Chest: Lungs CTAB. No increased WOB. Abdomen: +BS, S, ND Extremities: L AKA with wound vac in  place. No drainage from wound vac. No TTP at stump or erythema. Hands slightly edematous. Neuro: More alert today. Oriented to self and general place (hospital) but not time. Speech intermittently intelligible (also edentulous). Rarely responded to direct questioning.     Laboratory:  Recent Labs Lab 11/16/15 0537 11/17/15 0414 11/18/15 0443  WBC 9.1 9.2 9.5  HGB 10.0* 8.7* 9.3*  HCT 30.3* 27.0* 28.0*  PLT 214 276 271    Recent Labs Lab 11/15/15 0525 11/18/15 0443 11/19/15 0530  NA 149* 149* 143  K 3.8 3.4* 3.8  CL 125* 122* 114*  CO2 15* 20* 21*  BUN 24* 20 22*  CREATININE 1.21* 1.09* 1.05*  CALCIUM 7.7* 7.4* 7.4*  GLUCOSE 232* 135* 147*    Imaging/Diagnostic Tests: Dg Abd 1 View  11/14/2015  CLINICAL DATA:  Abdominal pain, possible constipation EXAM: ABDOMEN - 1 VIEW COMPARISON:  None. FINDINGS: Scattered large and small bowel gas is noted. Mild fecal material is noted although no significant retention is seen. Degenerative changes of the lumbar spine are noted. Heavy vascular calcifications are seen. Diverticulosis is noted. IMPRESSION: No acute abnormality noted. Electronically Signed   By: Alcide Clever M.D.   On: 11/14/2015 14:21   Ct Head Wo Contrast  11/14/2015  CLINICAL DATA:  Altered mental status status post leg amputation, dementia EXAM: CT HEAD WITHOUT CONTRAST TECHNIQUE: Contiguous axial images were obtained from the base of the skull through the vertex without intravenous contrast. COMPARISON:  12/30/2014 FINDINGS: Motion degraded images. No evidence of parenchymal hemorrhage or extra-axial fluid collection. No mass lesion, mass effect, or midline shift. No CT evidence of acute infarction. Encephalomalacic changes in the right anterior temporal lobe, chronic. Extensive small vessel ischemic changes. Global cortical atrophy.  No ventriculomegaly. Chronic opacification of the right maxillary sinus. Mild mucosal thickening of the bilateral frontal, right ethmoid, and left maxillary sinuses. Mastoid air cells are clear. Prior bi-frontotemporal craniectomy. No evidence of calvarial fracture. IMPRESSION: Motion degraded images. No evidence of acute intracranial abnormality. Encephalomalacic changes in the right anterior temporal lobe,  chronic. Atrophy with extensive small vessel ischemic changes. Electronically Signed   By: Charline Bills M.D.   On: 11/14/2015 17:19    Arilyn Brierley Percell Boston, MD 11/19/2015, 6:44 AM PGY-1, [place] [facility] FPTS Intern pager: [phone], text pages welcome

## 2015-11-19 NOTE — Discharge Instructions (Signed)
Ann [name] was hospitalized for left above-the-knee amputation, performed 11/11/2015. She continued to have altered mental status, which improved from time of surgery. She completed treatment for a urinary tract infection. Narcotic pain medication seemed to cause more confusion, so she will be discharged on tylenol, ibuprofen and a muscle relaxant called robaxin. She should follow-up with Dr. Lajoyce Corners in 2 weeks. Her left stump should be dressed in dry dressings until this follow-up appointment. Amiodarone and xarelto were continued for atrial fibrillation.  With further discussion with palliative care, you may decide to simplify medication regimen by discontinuing preventative medications like namenda, lipitor and daily vitamin (though this will help with wound healing.)  Please seek medical attention if Ann [name] has any significant reduction in how much she is willing to eat. She was doing well, especially with drinking, at time of discharge.

## 2015-11-19 NOTE — Progress Notes (Signed)
[facility] ambulance arranged on behalf of pt. 

## 2015-11-19 NOTE — Telephone Encounter (Signed)
Ann [name] is at [facility]. Pt is being discharged today from the hospital.  Ann [name] wants to know if Dr Doroteo Glassman will be pt attending physican if pt is accepted into Hospice.  Does Dr Doroteo Glassman want hospice drs to handle sympton management?

## 2015-11-19 NOTE — Telephone Encounter (Signed)
Will forward to MD. Jazmin Hartsell,CMA  

## 2015-11-19 NOTE — Progress Notes (Signed)
Patient left UE weeping prior to discharge. Reported that she began weeping after BP cuff was applied in that area. MD notified.  MD evaluate and assessed the patient. Place gauze and abdominal pad and kerlex to the area prior to discharge. Patient  discharged to home accompanied by two EMS personnel. Prescription and discharge instructions given. Patient daughter verbalizes understanding. All personal belongings given. Telemetry box removed prior to discharge.

## 2015-11-19 NOTE — Progress Notes (Signed)
Spoke with RN who is unsure if pt will be transferred with or without Wound Vac. Called Stacie with [facility] who confirms they are expecting to receive pt for Home Care this pm. Do not anticipate Wound Vac. Spoke with Gregory, RNCM who confirms she spoke with MDs this am who clarified that RN is to remove wound Vac prior to discharge. This Clinical research associate will text Intern and remind them to enter Wound Vac d/C orders. Will make RN aware.

## 2015-11-24 MED FILL — ATORVASTATIN 20 MG TABLET: 20 | 30 days supply | Qty: 30 | Fill #0

## 2015-11-26 MED FILL — levETIRAcetam 500 MG TABS: 500 | 30 days supply | Qty: 60 | Fill #3

## 2015-11-27 ENCOUNTER — Telehealth: Payer: Self-pay | Admitting: *Deleted

## 2015-11-27 NOTE — Telephone Encounter (Signed)
Ann [name] with [facility] called requesting medication for increased anxiety and agitation.  She is not sure if it is related to the Left AKA.  Patient can not take narcotics because it will increase her confusion.. Patient only has Tylenol, Ibuprofen and Robaxin for pain.  Patient uses [facility].  Please give her a call at [phone].  Clovis Pu, RN

## 2015-11-28 MED ORDER — LORAZEPAM 0.5 MG PO TABS: 0.5000 mg | ORAL_TABLET | Freq: Two times a day (BID) | ORAL | Status: AC | PRN

## 2015-11-28 NOTE — Telephone Encounter (Signed)
Spoke with [facility]. Order placed for Ativan BID prn for anxiety/agitation. [facility] printed [facility].

## 2015-12-01 NOTE — Telephone Encounter (Signed)
Faxed to [facility].  Clovis Pu, RN

## 2015-12-12 MED FILL — LORazepam 0.5 MG TABS: 0.5 | 15 days supply | Qty: 30 | Fill #0

## 2015-12-12 MED FILL — XARELTO 15 MG TABLET: 15 | 30 days supply | Qty: 30 | Fill #1

## 2015-12-15 DIAGNOSIS — M79606 Pain in leg, unspecified: Secondary | ICD-10-CM | POA: Diagnosis not present

## 2015-12-19 MED FILL — HALOPERIDOL LAC 2 MG/ML CON: 2 | 7 days supply | Qty: 120 | Fill #0

## 2015-12-24 ENCOUNTER — Inpatient Hospital Stay (HOSPITAL_COMMUNITY)
Admission: EM | Admit: 2015-12-24 | Discharge: 2015-12-26 | DRG: 871 | Disposition: A | Discharge status: Hospice | Attending: Family Medicine | Admitting: Family Medicine

## 2015-12-24 ENCOUNTER — Encounter (HOSPITAL_COMMUNITY): Payer: Self-pay | Admitting: *Deleted

## 2015-12-24 ENCOUNTER — Emergency Department (HOSPITAL_COMMUNITY)

## 2015-12-24 ENCOUNTER — Telehealth: Payer: Self-pay | Admitting: Obstetrics and Gynecology

## 2015-12-24 DIAGNOSIS — Z89512 Acquired absence of left leg below knee: Secondary | ICD-10-CM | POA: Diagnosis not present

## 2015-12-24 DIAGNOSIS — A419 Sepsis, unspecified organism: Principal | ICD-10-CM | POA: Diagnosis present

## 2015-12-24 DIAGNOSIS — R64 Cachexia: Secondary | ICD-10-CM | POA: Diagnosis present

## 2015-12-24 DIAGNOSIS — Z515 Encounter for palliative care: Secondary | ICD-10-CM | POA: Diagnosis present

## 2015-12-24 DIAGNOSIS — S78012A Complete traumatic amputation at left hip joint, initial encounter: Secondary | ICD-10-CM | POA: Diagnosis not present

## 2015-12-24 DIAGNOSIS — L8990 Pressure ulcer of unspecified site, unspecified stage: Secondary | ICD-10-CM | POA: Diagnosis not present

## 2015-12-24 DIAGNOSIS — E861 Hypovolemia: Secondary | ICD-10-CM | POA: Diagnosis present

## 2015-12-24 DIAGNOSIS — Z7901 Long term (current) use of anticoagulants: Secondary | ICD-10-CM

## 2015-12-24 DIAGNOSIS — E7800 Pure hypercholesterolemia, unspecified: Secondary | ICD-10-CM | POA: Diagnosis present

## 2015-12-24 DIAGNOSIS — H269 Unspecified cataract: Secondary | ICD-10-CM | POA: Diagnosis present

## 2015-12-24 DIAGNOSIS — Z89612 Acquired absence of left leg above knee: Secondary | ICD-10-CM | POA: Diagnosis not present

## 2015-12-24 DIAGNOSIS — R0682 Tachypnea, not elsewhere classified: Secondary | ICD-10-CM | POA: Diagnosis not present

## 2015-12-24 DIAGNOSIS — R627 Adult failure to thrive: Secondary | ICD-10-CM | POA: Diagnosis not present

## 2015-12-24 DIAGNOSIS — E875 Hyperkalemia: Secondary | ICD-10-CM | POA: Diagnosis not present

## 2015-12-24 DIAGNOSIS — G8929 Other chronic pain: Secondary | ICD-10-CM | POA: Diagnosis not present

## 2015-12-24 DIAGNOSIS — Z86718 Personal history of other venous thrombosis and embolism: Secondary | ICD-10-CM

## 2015-12-24 DIAGNOSIS — G9340 Encephalopathy, unspecified: Secondary | ICD-10-CM

## 2015-12-24 DIAGNOSIS — H9190 Unspecified hearing loss, unspecified ear: Secondary | ICD-10-CM | POA: Diagnosis present

## 2015-12-24 DIAGNOSIS — R6521 Severe sepsis with septic shock: Secondary | ICD-10-CM | POA: Diagnosis present

## 2015-12-24 DIAGNOSIS — I110 Hypertensive heart disease with heart failure: Secondary | ICD-10-CM | POA: Diagnosis present

## 2015-12-24 DIAGNOSIS — Z8673 Personal history of transient ischemic attack (TIA), and cerebral infarction without residual deficits: Secondary | ICD-10-CM | POA: Diagnosis not present

## 2015-12-24 DIAGNOSIS — Z66 Do not resuscitate: Secondary | ICD-10-CM | POA: Diagnosis not present

## 2015-12-24 DIAGNOSIS — T8744 Infection of amputation stump, left lower extremity: Secondary | ICD-10-CM | POA: Diagnosis not present

## 2015-12-24 DIAGNOSIS — R109 Unspecified abdominal pain: Secondary | ICD-10-CM | POA: Diagnosis not present

## 2015-12-24 DIAGNOSIS — R4182 Altered mental status, unspecified: Secondary | ICD-10-CM | POA: Diagnosis present

## 2015-12-24 DIAGNOSIS — E785 Hyperlipidemia, unspecified: Secondary | ICD-10-CM | POA: Diagnosis present

## 2015-12-24 DIAGNOSIS — N179 Acute kidney failure, unspecified: Secondary | ICD-10-CM | POA: Diagnosis not present

## 2015-12-24 DIAGNOSIS — G40909 Epilepsy, unspecified, not intractable, without status epilepticus: Secondary | ICD-10-CM | POA: Diagnosis present

## 2015-12-24 DIAGNOSIS — E870 Hyperosmolality and hypernatremia: Secondary | ICD-10-CM | POA: Diagnosis present

## 2015-12-24 DIAGNOSIS — R509 Fever, unspecified: Secondary | ICD-10-CM | POA: Diagnosis not present

## 2015-12-24 DIAGNOSIS — E46 Unspecified protein-calorie malnutrition: Secondary | ICD-10-CM | POA: Diagnosis not present

## 2015-12-24 DIAGNOSIS — I480 Paroxysmal atrial fibrillation: Secondary | ICD-10-CM | POA: Diagnosis present

## 2015-12-24 DIAGNOSIS — F0280 Dementia in other diseases classified elsewhere without behavioral disturbance: Secondary | ICD-10-CM | POA: Diagnosis present

## 2015-12-24 DIAGNOSIS — T8743 Infection of amputation stump, right lower extremity: Secondary | ICD-10-CM | POA: Diagnosis present

## 2015-12-24 DIAGNOSIS — I5022 Chronic systolic (congestive) heart failure: Secondary | ICD-10-CM | POA: Diagnosis present

## 2015-12-24 DIAGNOSIS — G309 Alzheimer's disease, unspecified: Secondary | ICD-10-CM | POA: Diagnosis not present

## 2015-12-24 DIAGNOSIS — L89150 Pressure ulcer of sacral region, unstageable: Secondary | ICD-10-CM | POA: Diagnosis present

## 2015-12-24 HISTORY — DX: Peripheral vascular disease, unspecified: I73.9

## 2015-12-24 LAB — COMPREHENSIVE METABOLIC PANEL WITH GFR
ALT: 54 U/L (ref 14–54)
AST: 264 U/L — ABNORMAL HIGH (ref 15–41)
Albumin: 1.5 g/dL — ABNORMAL LOW (ref 3.5–5.0)
Alkaline Phosphatase: 61 U/L (ref 38–126)
Anion gap: 18 — ABNORMAL HIGH (ref 5–15)
BUN: 65 mg/dL — ABNORMAL HIGH (ref 6–20)
CO2: 15 mmol/L — ABNORMAL LOW (ref 22–32)
Calcium: 7.4 mg/dL — ABNORMAL LOW (ref 8.9–10.3)
Chloride: 119 mmol/L — ABNORMAL HIGH (ref 101–111)
Creatinine, Ser: 3.73 mg/dL — ABNORMAL HIGH (ref 0.44–1.00)
GFR calc Af Amer: 12 mL/min — ABNORMAL LOW
GFR calc non Af Amer: 10 mL/min — ABNORMAL LOW
Glucose, Bld: 177 mg/dL — ABNORMAL HIGH (ref 65–99)
Potassium: 4 mmol/L (ref 3.5–5.1)
Sodium: 152 mmol/L — ABNORMAL HIGH (ref 135–145)
Total Bilirubin: 3.2 mg/dL — ABNORMAL HIGH (ref 0.3–1.2)
Total Protein: 5.8 g/dL — ABNORMAL LOW (ref 6.5–8.1)

## 2015-12-24 LAB — BLOOD GAS, VENOUS

## 2015-12-24 LAB — CBC WITH DIFFERENTIAL/PLATELET
Basophils Absolute: 0 10*3/uL (ref 0.0–0.1)
Basophils Relative: 0 %
EOS ABS: 0 10*3/uL (ref 0.0–0.7)
EOS PCT: 0 %
HCT: 27.7 % — ABNORMAL LOW (ref 36.0–46.0)
Hemoglobin: 8.8 g/dL — ABNORMAL LOW (ref 12.0–15.0)
LYMPHS ABS: 0.6 10*3/uL — AB (ref 0.7–4.0)
Lymphocytes Relative: 4 %
MCH: 26.6 pg (ref 26.0–34.0)
MCHC: 31.8 g/dL (ref 30.0–36.0)
MCV: 83.7 fL (ref 78.0–100.0)
MONOS PCT: 6 %
Monocytes Absolute: 1 10*3/uL (ref 0.1–1.0)
Neutro Abs: 16 10*3/uL — ABNORMAL HIGH (ref 1.7–7.7)
Neutrophils Relative %: 91 %
PLATELETS: 278 10*3/uL (ref 150–400)
RBC: 3.31 MIL/uL — ABNORMAL LOW (ref 3.87–5.11)
RDW: 20.3 % — AB (ref 11.5–15.5)
WBC: 17.6 10*3/uL — ABNORMAL HIGH (ref 4.0–10.5)

## 2015-12-24 LAB — URINALYSIS, ROUTINE W REFLEX MICROSCOPIC
Glucose, UA: NEGATIVE mg/dL
Ketones, ur: 15 mg/dL — AB
Nitrite: POSITIVE — AB
PROTEIN: 30 mg/dL — AB
Specific Gravity, Urine: 1.018 (ref 1.005–1.030)
pH: 5 (ref 5.0–8.0)

## 2015-12-24 LAB — I-STAT CG4 LACTIC ACID, ED
Lactic Acid, Venous: 4.45 mmol/L (ref 0.5–2.0)
Lactic Acid, Venous: 7.53 mmol/L (ref 0.5–2.0)

## 2015-12-24 LAB — URINE MICROSCOPIC-ADD ON

## 2015-12-24 LAB — I-STAT VENOUS BLOOD GAS, ED
Acid-base deficit: 2 mmol/L (ref 0.0–2.0)
Bicarbonate: 21.4 mEq/L (ref 20.0–24.0)
O2 Saturation: 71 %
PH VEN: 7.443 — AB (ref 7.250–7.300)
TCO2: 22 mmol/L (ref 0–100)
pCO2, Ven: 31.3 mmHg — ABNORMAL LOW (ref 45.0–50.0)
pO2, Ven: 35 mmHg (ref 31.0–45.0)

## 2015-12-24 LAB — I-STAT CHEM 8, ED
BUN: 85 mg/dL — ABNORMAL HIGH (ref 6–20)
CALCIUM ION: 0.79 mmol/L — AB (ref 1.13–1.30)
CHLORIDE: 118 mmol/L — AB (ref 101–111)
Creatinine, Ser: 3.9 mg/dL — ABNORMAL HIGH (ref 0.44–1.00)
GLUCOSE: 90 mg/dL (ref 65–99)
HCT: 29 % — ABNORMAL LOW (ref 36.0–46.0)
HEMOGLOBIN: 9.9 g/dL — AB (ref 12.0–15.0)
Potassium: 7.7 mmol/L (ref 3.5–5.1)
SODIUM: 151 mmol/L — AB (ref 135–145)
TCO2: 19 mmol/L (ref 0–100)

## 2015-12-24 LAB — CBG MONITORING, ED: Glucose-Capillary: 150 mg/dL — ABNORMAL HIGH (ref 65–99)

## 2015-12-24 LAB — GLUCOSE, CAPILLARY: GLUCOSE-CAPILLARY: 166 mg/dL — AB (ref 65–99)

## 2015-12-24 MED ORDER — ALBUTEROL SULFATE (2.5 MG/3ML) 0.083% IN NEBU
10.0000 mg | INHALATION_SOLUTION | Freq: Once | RESPIRATORY_TRACT | Status: DC
  Filled 2015-12-24: qty 12

## 2015-12-24 MED ORDER — HALOPERIDOL LACTATE 2 MG/ML PO CONC
4.0000 mg | Freq: Four times a day (QID) | ORAL | Status: DC
  Filled 2015-12-24 (×3): qty 2

## 2015-12-24 MED ORDER — PIPERACILLIN-TAZOBACTAM 3.375 G IVPB 30 MIN
3.3750 g | Freq: Once | INTRAVENOUS | Status: AC
  Administered 2015-12-24: 3.375 g via INTRAVENOUS
  Filled 2015-12-24: qty 50

## 2015-12-24 MED ORDER — NOREPINEPHRINE BITARTRATE 1 MG/ML IV SOLN
2.0000 ug/min | INTRAVENOUS | Status: DC
  Administered 2015-12-24: 5 ug/min via INTRAVENOUS
  Filled 2015-12-24: qty 4

## 2015-12-24 MED ORDER — MORPHINE SULFATE (PF) 2 MG/ML IV SOLN: 1.0000 mg | INTRAVENOUS | Status: DC | PRN

## 2015-12-24 MED ORDER — LEVETIRACETAM 500 MG PO TABS
500.0000 mg | ORAL_TABLET | Freq: Two times a day (BID) | ORAL | Status: DC
  Filled 2015-12-24: qty 1

## 2015-12-24 MED ORDER — INSULIN ASPART 100 UNIT/ML IV SOLN
10.0000 [IU] | Freq: Once | INTRAVENOUS | Status: AC
  Administered 2015-12-24: 10 [IU] via INTRAVENOUS
  Filled 2015-12-24: qty 1

## 2015-12-24 MED ORDER — LORAZEPAM 0.5 MG PO TABS
0.5000 mg | ORAL_TABLET | Freq: Two times a day (BID) | ORAL | Status: DC
  Filled 2015-12-24: qty 1

## 2015-12-24 MED ORDER — ONDANSETRON HCL 4 MG/2ML IJ SOLN: 4.0000 mg | Freq: Four times a day (QID) | INTRAMUSCULAR | Status: DC | PRN

## 2015-12-24 MED ORDER — LEVETIRACETAM 500 MG/5ML IV SOLN
500.0000 mg | Freq: Two times a day (BID) | INTRAVENOUS | Status: DC
  Administered 2015-12-25 (×3): 500 mg via INTRAVENOUS
  Filled 2015-12-24 (×6): qty 5

## 2015-12-24 MED ORDER — SODIUM CHLORIDE 0.9 % IV BOLUS (SEPSIS)
1000.0000 mL | Freq: Once | INTRAVENOUS | Status: AC
  Administered 2015-12-24: 1000 mL via INTRAVENOUS

## 2015-12-24 MED ORDER — SODIUM CHLORIDE 0.9 % IV BOLUS (SEPSIS)
2000.0000 mL | Freq: Once | INTRAVENOUS | Status: AC
  Administered 2015-12-24: 2000 mL via INTRAVENOUS

## 2015-12-24 MED ORDER — HALOPERIDOL LACTATE 5 MG/ML IJ SOLN: 4.0000 mg | Freq: Four times a day (QID) | INTRAMUSCULAR | Status: DC

## 2015-12-24 MED ORDER — SODIUM CHLORIDE 0.9 % IV SOLN
1.0000 g | Freq: Once | INTRAVENOUS | Status: AC
  Administered 2015-12-24: 1 g via INTRAVENOUS
  Filled 2015-12-24 (×2): qty 10

## 2015-12-24 MED ORDER — ACETAMINOPHEN 650 MG RE SUPP: 650.0000 mg | RECTAL | Status: DC | PRN

## 2015-12-24 MED ORDER — PHENYLEPHRINE HCL 10 MG/ML IJ SOLN
30.0000 ug/min | INTRAVENOUS | Status: DC
  Administered 2015-12-24: 50 ug/min via INTRAVENOUS
  Administered 2015-12-24: 70 ug/min via INTRAVENOUS
  Administered 2015-12-25: 100 ug/min via INTRAVENOUS
  Administered 2015-12-25: 60 ug/min via INTRAVENOUS
  Administered 2015-12-25: 30 ug/min via INTRAVENOUS
  Administered 2015-12-25: 40 ug/min via INTRAVENOUS
  Administered 2015-12-25: 90 ug/min via INTRAVENOUS
  Administered 2015-12-25: 100 ug/min via INTRAVENOUS
  Administered 2015-12-25 (×2): 90 ug/min via INTRAVENOUS
  Filled 2015-12-24 (×10): qty 1

## 2015-12-24 MED ORDER — HEPARIN SODIUM (PORCINE) 5000 UNIT/ML IJ SOLN
5000.0000 [IU] | Freq: Three times a day (TID) | INTRAMUSCULAR | Status: DC
  Administered 2015-12-24 – 2015-12-26 (×5): 5000 [IU] via SUBCUTANEOUS
  Filled 2015-12-24 (×4): qty 1

## 2015-12-24 MED ORDER — SODIUM CHLORIDE 0.9 % IV SOLN: 250.0000 mL | INTRAVENOUS | Status: DC | PRN

## 2015-12-24 MED ORDER — SODIUM CHLORIDE 0.9 % IV SOLN
INTRAVENOUS | Status: DC
  Administered 2015-12-24: 21:00:00 via INTRAVENOUS

## 2015-12-24 MED ORDER — DEXTROSE 50 % IV SOLN
1.0000 | Freq: Once | INTRAVENOUS | Status: AC
  Administered 2015-12-24: 50 mL via INTRAVENOUS
  Filled 2015-12-24: qty 50

## 2015-12-24 MED ORDER — VANCOMYCIN HCL IN DEXTROSE 1-5 GM/200ML-% IV SOLN
1000.0000 mg | Freq: Once | INTRAVENOUS | Status: AC
  Administered 2015-12-24: 1000 mg via INTRAVENOUS
  Filled 2015-12-24: qty 200

## 2015-12-24 MED ORDER — PIPERACILLIN-TAZOBACTAM IN DEX 2-0.25 GM/50ML IV SOLN
2.2500 g | Freq: Three times a day (TID) | INTRAVENOUS | Status: DC
  Administered 2015-12-24 – 2015-12-26 (×5): 2.25 g via INTRAVENOUS
  Filled 2015-12-24 (×10): qty 50

## 2015-12-24 MED ORDER — ALBUTEROL (5 MG/ML) CONTINUOUS INHALATION SOLN
10.0000 mg/h | INHALATION_SOLUTION | RESPIRATORY_TRACT | Status: DC
  Administered 2015-12-24: 10 mg/h via RESPIRATORY_TRACT

## 2015-12-24 MED ORDER — FENTANYL CITRATE (PF) 100 MCG/2ML IJ SOLN
50.0000 ug | Freq: Once | INTRAMUSCULAR | Status: AC
  Administered 2015-12-26: 50 ug via INTRAVENOUS
  Filled 2015-12-24: qty 2

## 2015-12-24 MED ORDER — MORPHINE SULFATE (PF) 2 MG/ML IV SOLN
1.0000 mg | INTRAVENOUS | Status: DC | PRN
  Administered 2015-12-25: 1 mg via INTRAVENOUS
  Filled 2015-12-24: qty 1

## 2015-12-24 MED ORDER — MORPHINE SULFATE (CONCENTRATE) 10 MG/0.5ML PO SOLN
5.0000 mg | ORAL | Status: DC | PRN
  Administered 2015-12-24: 5 mg via ORAL
  Filled 2015-12-24: qty 0.5

## 2015-12-24 NOTE — H&P (Signed)
PULMONARY / CRITICAL CARE MEDICINE   Name: Ann [name] MRN: [id number] DOB: 09/08/1927    ADMISSION DATE:  12/24/2015 CONSULTATION DATE:  12/24/15  REFERRING MD:  Dr. Adela Lank  CHIEF COMPLAINT:  Shock, AMS  HISTORY OF PRESENT ILLNESS:   [name] is a 82 year old female with PMH of HTN, Dementia, HLD, seizures, DVT (LLE not currently on [facility]), stroke, PAF, and PAD s/p bilateral AKA secondary to gangrene. She presented to [facility] ED by EMS for concern of altered mental status and foul smelling wound - left AKA wound and sacral wound for 2 weeks.  She currently is cared for at home by her granddaughter [name]) and daughter [name]) She was discharged home as a DNI/DNR with Hospice on 11/18/15 after left AKA in the setting of dry gangrene requiring a wound vac in the hospital. Family at that time insisted that she come home instead of rehab at a SNF (as she had previously been to rehab and did not tolerate it well). She does have a history of dementia and normally recognizes her family and knows of her surrounding until the last 2 weeks.  Family reports she has developed a sacral ulcer since being discharged home that has progressed to draining and now foul smelling; additionally reports she has not been eating, with fevers and altered mental status for the last two weeks. With EMS, she was hypotensive 98/palp, HR 139, RR 36 treated with 1L NS. In the ED, she was noted to have a fever 101.9, HR 110, RR 40, 100% RA, and continued hypotension 65/57. Workup notable for istat chem 8 of Na 151, K 7.7, Cl 118, Bun 85, Cr 3.90 (prior Cr 1.05 11/19/15), glucose 90, H/H 9.9/29.0, lactic acid 4.45, Venous gas 7.443/31.3/35/21.4, WBC 17.6, plts 270. She was treated with additional 2 L NS, calcium gluconate 1gm, insulin, D50, Albuterol, vancomycin, and zosyn. CXR no acute inflitrate. She remained hypotensive after adequate fluid resuscitation and therefore levophed started. She has very poor venous  access, repeat labs to re-evaluate hyperkalemia have been hemolyzed. She has been lethargic in ED but able to answer some questions. She has remained a DNI/DNR however family wants infection treated if reversible. PCCM consulted for septic shock.    PAST MEDICAL HISTORY :  She  has a past medical history of Hypertension; Dementia; Hyperlipidemia; Seizures (HCC); Metatarsal bone fracture; DVT (deep venous thrombosis) (HCC) (02/22/2013); Stroke [facility]); Trigeminal neuralgia (11/03/2006); CATARACT (11/03/2006); HEARING LOSS NOS OR DEAFNESS (11/03/2006); INCONTINENCE, URGE (11/03/2006); Gangrene (HCC); Atrial fibrillation (HCC); Shortness of breath dyspnea; and PAD (peripheral artery disease) (HCC).  PAST SURGICAL HISTORY: She  has past surgical history that includes Toe Surgery; Amputation (Right, 10/26/2013); abdominal aortagram (N/A, 09/13/2013); Below knee leg amputation (Right, 10/2013); and Amputation (Left, 11/11/2015).  Allergies  Allergen Reactions  . Lisinopril Other (See Comments)    Possible Angioedema on 11/16/13 admission.    No current facility-administered medications on file prior to encounter.   Current Outpatient Prescriptions on File Prior to Encounter  Medication Sig  . acetaminophen (TYLENOL) 325 MG tablet Take 2 tablets (650 mg total) by mouth every 8 (eight) hours as needed for mild pain or moderate pain.  Marland Kitchen amiodarone (PACERONE) 200 MG tablet TAKE 1 TABLET BY MOUTH TWICE DAILY  . atorvastatin (LIPITOR) 20 MG tablet Take 1 tablet (20 mg total) by mouth daily at 6 PM.  . feeding supplement, ENSURE ENLIVE, (ENSURE ENLIVE) LIQD Take 237 mLs by mouth 2 (two) times daily between meals.  Marland Kitchen  ibuprofen (ADVIL,MOTRIN) 200 MG tablet Take 1 tablet (200 mg total) by mouth every 6 (six) hours as needed for mild pain or moderate pain.  Marland Kitchen levETIRAcetam (KEPPRA) 500 MG tablet TAKE 1 TABLET BY MOUTH 2 TIMES DAILY.  Marland Kitchen loratadine (CLARITIN) 5 MG/5ML syrup Take 10 mLs (10 mg total) by mouth daily as  needed for itching.  Marland Kitchen LORazepam (ATIVAN) 0.5 MG tablet Take 1 tablet (0.5 mg total) by mouth 2 (two) times daily as needed for anxiety (agitation). (Patient taking differently: Take 0.5 mg by mouth 2 (two) times daily. )  . memantine (NAMENDA) 10 MG tablet Take 1 tablet (10 mg total) by mouth 2 (two) times daily.  . methocarbamol (ROBAXIN) 500 MG tablet Take 1 tablet (500 mg total) by mouth every 6 (six) hours as needed for muscle spasms.  . Multiple Vitamin (MULTIVITAMIN WITH MINERALS) TABS tablet Take 1 tablet by mouth daily.  . ondansetron (ZOFRAN) 4 MG tablet Take 1 tablet (4 mg total) by mouth every 6 (six) hours as needed for nausea.  Marland Kitchen triamcinolone cream (KENALOG) 0.1 % Apply 1 application topically 2 (two) times daily.  Carlena Hurl 15 MG TABS tablet TAKE 1 TABLET BY MOUTH DAILY    FAMILY HISTORY:  Her indicated that her mother is deceased. She indicated that her father is deceased. She indicated that her sister is deceased. She indicated that both of her brothers are alive. She indicated that her daughter is alive.   SOCIAL HISTORY: She  reports that she has never smoked. She has never used smokeless tobacco. She reports that she does not drink alcohol or use illicit drugs.  REVIEW OF SYSTEMS:  Unable to complete as patient is altered / baseline dementia.  Information obtained from prior medical documentation and family at bedside.   SUBJECTIVE:   VITAL SIGNS: BP 97/73 mmHg  Pulse 104  Temp(Src) 101.9 F (38.8 C) (Rectal)  Resp 20  Wt 140 lb (63.504 kg)  SpO2 100%  HEMODYNAMICS:    VENTILATOR SETTINGS:    INTAKE / OUTPUT:    PHYSICAL EXAMINATION: General: Ill-appearing elderly female, moaning  Neuro: Eyes open to pain, answers simple questions/follows commands HEENT: Mucous membranes pink/dry, no appreciable JVD Cardiovascular: Distant hard to appreciate over moaning, tachycardic, +murmur MCL/5ICS, weak radial pulses Lungs: Clear, diminished in the bases,  non-labored breathing Abdomen: Soft, round, +BS Musculoskeletal: bilateral LE AKA's, no acute deformities  Skin: cool extremities/dry, 3+pitting edema LUE, bilateral AKA, wound to left distal/medial stump with areas of yellow drainage, foul odor from sacral wound  LABS:  BMET  Recent Labs Lab 12/24/15 1242 12/24/15 1356  NA 151* HEMOLYSIS AT THIS LEVEL MAY AFFECT RESULT  K 7.7* HEMOLYSIS AT THIS LEVEL MAY AFFECT RESULT  CL 118* HEMOLYSIS AT THIS LEVEL MAY AFFECT RESULT  CO2  --  HEMOLYSIS AT THIS LEVEL MAY AFFECT RESULT  BUN 85* HEMOLYSIS AT THIS LEVEL MAY AFFECT RESULT  CREATININE 3.90* HEMOLYSIS AT THIS LEVEL MAY AFFECT RESULT  GLUCOSE 90 HEMOLYSIS AT THIS LEVEL MAY AFFECT RESULT    Electrolytes  Recent Labs Lab 12/24/15 1356  CALCIUM HEMOLYSIS AT THIS LEVEL MAY AFFECT RESULT    CBC  Recent Labs Lab 12/24/15 1242 12/24/15 1244  WBC  --  17.6*  HGB 9.9* 8.8*  HCT 29.0* 27.7*  PLT  --  278    Coag's No results for input(s): APTT, INR in the last 168 hours.  Sepsis Markers  Recent Labs Lab 12/24/15 1243  LATICACIDVEN 4.45*  ABG No results for input(s): PHART, PCO2ART, PO2ART in the last 168 hours.  Liver Enzymes  Recent Labs Lab 12/24/15 1356  AST HEMOLYSIS AT THIS LEVEL MAY AFFECT RESULT  ALT HEMOLYSIS AT THIS LEVEL MAY AFFECT RESULT  ALKPHOS HEMOLYSIS AT THIS LEVEL MAY AFFECT RESULT  BILITOT HEMOLYSIS AT THIS LEVEL MAY AFFECT RESULT  ALBUMIN HEMOLYSIS AT THIS LEVEL MAY AFFECT RESULT    Cardiac Enzymes No results for input(s): TROPONINI, PROBNP in the last 168 hours.  Glucose  Recent Labs Lab 12/24/15 1509  GLUCAP 150*    Imaging Ct Abdomen Pelvis Wo Contrast  12/24/2015  CLINICAL DATA:  82 year old female with abdominal pain. Decubitus ulcer. Fever of 101. Renal insufficiency precludes IV contrast. Initial encounter. EXAM: CT ABDOMEN AND PELVIS WITHOUT CONTRAST TECHNIQUE: Multidetector CT imaging of the abdomen and pelvis was  performed following the standard protocol without IV contrast. COMPARISON:  Right hip CT 11/08/2013. FINDINGS: Cardiomegaly. No pericardial effusion. Trace layering right pleural effusion. Confluent dependent pulmonary opacity in the right lung. Minor dependent atelectasis in the left lung. Osteopenia. Widespread degenerative changes with areas of interbody and posterior element ankylosis in the spine. There is bilateral SI joint ankylosis. There is a decubitus wound overlying the S4-S5 sacral levels (series 2, image 68) with a small volume of subcutaneous gas. There is associated soft tissue thickening at the sacrococcygeal junction (series 2, image 72) without an organized or drainable fluid collection. There is no strong CT evidence of sacral osteomyelitis. Moderate presacral stranding. Stool ball in the rectum. No pelvic free fluid. Negative uterus and adnexa. Diminutive urinary bladder. Numerous pelvic phleboliths. Small fat containing inguinal hernias. Redundant sigmoid colon with retained stool. Decompressed left colon. Decompressed splenic flexure. Much of the transverse colon is decompressed. Negative right colon aside from retained stool. Negative terminal ileum. No dilated small bowel. Decompressed stomach and duodenum. No abdominal free air or free fluid identified. Vicarious contrast excretion to the gallbladder. Hyperdense but otherwise negative noncontrast appearance of the liver. Noncontrast spleen, pancreas and adrenal glands are within normal limits. Negative noncontrast kidneys ectatic abdominal aorta and iliac arteries with widespread calcified atherosclerosis. No discrete abdominal aortic aneurysm. No lymphadenopathy. IMPRESSION: 1. Sacral decubitus wound with associated presacral stranding, but no drainable fluid collection or abscess abscess. And no CT evidence of sacral osteomyelitis at this time. 2. Trace layering right pleural effusion. Confluent right lower lobe pulmonary opacity might  reflect severe atelectasis but developing right lower lobe pneumonia is difficult to exclude. 3. Cardiomegaly. No pericardial effusion. Hyperdensity in the liver might relate to amiodarone therapy. 4. Vicarious contrast excretion to the gallbladder. 5. Widespread aortic ectasia and calcified atherosclerosis. Electronically Signed   By: Odessa Fleming M.D.   On: 12/24/2015 14:47   Dg Chest Port 1 View  12/24/2015  CLINICAL DATA:  Sepsis EXAM: PORTABLE CHEST 1 VIEW COMPARISON:  December 29, 2014 FINDINGS: There is stable bilateral midlung atelectatic change. There is no edema or consolidation. Heart is enlarged with pulmonary vascular within normal limits. Aorta is prominent, stable. Bones are somewhat osteoporotic. There is degenerative change in each shoulder. IMPRESSION: Stable midlung atelectatic change bilaterally. No edema or consolidation. Stable cardiac silhouette. Electronically Signed   By: Bretta Bang III M.D.   On: 12/24/2015 12:22   Dg Femur Port Min 2 Views Left  12/24/2015  CLINICAL DATA:  Amputation site with drainage EXAM: LEFT FEMUR PORTABLE 2 VIEWS COMPARISON:  None. FINDINGS: Frontal and attempted lateral views obtained. The patient has had amputation at the  mid femoral level. There is no acute fracture or dislocation. There is no demonstrable erosive change or bony destruction. There is no soft tissue air or obvious ulceration along the stump. There is extensive arterial vascular calcification. There is mild narrowing of the left hip joint. IMPRESSION: No bony destruction appreciable. No fracture or dislocation. No demonstrable soft tissue abscess. There is extensive arterial vascular calcification. Electronically Signed   By: Bretta Bang III M.D.   On: 12/24/2015 12:46   STUDIES:  CT ABD/Pelvis 4/19 >> sacral decubitus with associated presacral stranding, no drainable fluid collection or abscess, no evidence of sacral osteo, trace layering R pleural effusion, RLL consolidation,  cardiomegaly Femur XR 4/19 >>  CULTURES: UC 4/19 >>   ANTIBIOTICS: Vanco 4/19 >>  Zosyn 4/19 >>   SIGNIFICANT EVENTS: 4/19  Admit with shock, failure to thrive.  Concern for sacral wound / BKA source   DISCUSSION: 81 y/o F with PMH of dementia, peripheral vascular disease, recent (11/2015) BKA of L lower extremity on hospice admitted with concern for septic shock with suspected wound sources.  Family reports picture of failure to thrive.  DNR/DNI.  Brief vasopressor support - if no improvement, full comfort care.    ASSESSMENT / PLAN:   CARDIOVASCULAR A:  Septic Shock - suspected sacral / stump wounds P:  IVF as below See ID  DNR/DNI NO central line placement Neosynephrine via PIV for MAP >60 (family accepting of brief support)  RENAL A:   AKI - in setting of volume depletion / poor intake Hyperkalemia - s/p medical Rx in ER, difficulty with obtaining labs P:   Follow up CMP now Follow lactic acid  Pending K review, may need further chemical Rx  IVF @ 56ml/hr  GASTROINTESTINAL A:   Protein Calorie Malnutrition / Failure to Thrive P:   Comfort feeding as pt tolerates  HEMATOLOGIC A:   Hx AF / DVT - previously on xarelto, recently stopped by Hospice  LUE Swelling - present since prior admission in March P:  Monitor intermittent CBC Heparin SQ for DVT prophylaxis  Discussed no [place] assessment of LUE with family as she is not a candidate for anticoagulation    INFECTIOUS A:   Sacral Wound - dark areas concerning for eschar Septic Shock - suspect wounds as source R/o Osteomyelitis - no sacral osteo seen on imaging  P:   Continue empiric ABX as above Difficult venous draws limits cultures  Femur XR pending    NEUROLOGIC A:   Hx Seizure Disorder, Dementia, Chronic Pain (wounds) P:   RASS goal: n/a PRN rectal tylenol for pain Continue home haldol solution  PRN ativan for anxiety  PRN morphine for pain   FAMILY  - Updates: Family updated at bedside -  daughter [name]), granddaughter, grandson.  They are followed by hospice at home.  She has been an established DNR.  However, they have grown increasingly concerned regarding her sacral wounds.  They are interested in IVF's, antibiotics and brief vasopressor support (discussed 24-48 hrs) with the hope of improvement but that if she did not show improvement that we would progress to full comfort.  Priority is comfort.  DNR/DNI, no defib or ACLS.  She is followed by [facility].   They are aware of her admission and had recently been discussing [facility] admission.  The family stated they "wanted to feel like they had done everything".    - Inter-disciplinary family meet or Palliative Care meeting due by:  12/25/15    Canary Brim, NP-C [facility] Pgr: [phone] or if no answer [phone] 12/24/2015, 4:05 PM   Attending Note:  I have examined patient, reviewed labs, studies and notes. I have discussed the case with Henreitta Leber, and I agree with the data and plans as amended above. Ms Rostro is 51 with a hx of dementia, CVA, severe PVD, DM. She is debilitated by these illnesses and is cared for at home with hospice support. She has a sacral decub ulcer that has looked worse, has developed odor per family. She was brought for eval due top decreased LOC, decreased PO intake, weakness. On my evaluation she is poorly responsive, will turn her head to name. Her lungs are clear. Sacral wound has a foul odor. Her LE amputations are C/D/I. She has SIRS / sepsis, has evolved shock. Started on norepi for BP support. We have had good discussions with family regarding aggressiveness of care and goals of care. We will continue IV abx, support her with volume and pressors for 24h to see if we achieve stability, wean pressors to off. We will change to phenylephrine to allow [place] to use PIV, avoid CVC. If she is not rebounding tomorrow then we will consider a transition to  comfort care. Family is aware that her wound may not be able to be debrided, may not achieve source control. They are comfortable with a conservative plan of support. We will continue pressors. She is otherwise DNR. Independent critical care time is 60 minutes.   Levy Pupa, MD, PhD 12/24/2015, 4:47 PM [facility] [phone] or if no answer [phone]

## 2015-12-24 NOTE — Telephone Encounter (Signed)
Granddaughter calling to say that Ann [name] have a wound to her sacral that is very large and have and odor.  Want to know if she should be taken to hospital for tx.  Please call her back as soon as possible.

## 2015-12-24 NOTE — ED Notes (Signed)
Pt presents via [facility] from home with altered mental status.  Family called EMS for difficulty breathing-intially EMS reports pt breathing 36x/min.  Pt altered, hx dimentia, unknown baseline.  Pt has decubitus ulcer as well as ulcer to left stump, BL amputee.   Pt moaning and groaning on arrival.  20g L FA and 22g R H, 1000cc NS received by EMS.  Initial BP 98 palp, 148/64 on arrival, 100% 2L, 139 HR, 101 temp.

## 2015-12-24 NOTE — ED Notes (Addendum)
MD Adela Lank attempted left EJ for blood draw, unsuccessful.  MD to attempt [place] IV.

## 2015-12-24 NOTE — Progress Notes (Signed)
Pharmacy Antibiotic Note  Ann [name] is a 82 y.o. female admitted on 12/24/2015 with sepsis.  Pharmacy has been consulted for vancomycin and zosyn dosing. Pt presents with AMS and AKI. Will dose vancomycin based on random level while in AKI.  Pt received vancomycin 1g and zosyn 3.375g IV once in the ED.  Plan: Check 24h vancomycin random level  Zosyn 2.25g IV q8h Monitor culture data, renal function and clinical course VT at SS prn  Weight: 140 lb (63.504 kg)  Temp (24hrs), Avg:101.9 F (38.8 C), Min:101.9 F (38.8 C), Max:101.9 F (38.8 C)   Recent Labs Lab 12/24/15 1242 12/24/15 1243 12/24/15 1244  WBC  --   --  17.6*  CREATININE 3.90*  --   --   LATICACIDVEN  --  4.45*  --     Estimated Creatinine Clearance: 9 mL/min (by C-G formula based on Cr of 3.9).    Allergies  Allergen Reactions  . Lisinopril Other (See Comments)    Possible Angioedema on 11/16/13 admission.    Antimicrobials this admission: Vanc 4/19 >>  Zosyn 4/19 >>   Dose adjustments this admission: n/a  Microbiology results: 4/19 BCx: sent  UCx:    Sputum:    MRSA PCR:    Arlean Hopping. Newman Pies, PharmD, BCPS Clinical Pharmacist Pager [phone] 12/24/2015 3:29 PM

## 2015-12-24 NOTE — ED Notes (Signed)
Pt manual BP 80/63

## 2015-12-24 NOTE — ED Notes (Signed)
Attempted report 

## 2015-12-24 NOTE — ED Notes (Signed)
MD at bedside. 

## 2015-12-24 NOTE — ED Notes (Signed)
Received call from [employer] (house coverage) st's floor will not be able to take report for approx 30 mins.

## 2015-12-24 NOTE — Progress Notes (Signed)
[facility] ED Room D36-[facility]-RN Visit  This is a [facility] patient. She has been on service since 11/20/15 for diagnosis of Alzheimer's Disease.  Patient has an OOF DNR.  Patient seen in room with family at bedside.  Patient is unresponsive to verbal stimuli and groans with tactile stimulation. She is currently receiving Levophed at a rate of 18.8 mL/hr via a PIV. She is also receiving IV Zosyn.  Patient is on 2L [place] with respirations and O2 WNL,  Spoke to [place], who stated the plan is to admit to ICU, and see how she does overnight.  The family feels the patient may be close to EOL, however wants to see if there is any improvement in the next few days.  The family has been discussing potential transfer to [place], if she continues to decline and pending bed availability.  Updated [facility] social worker, Shawna Orleans, who plans to make a visit tomorrow.  Offered emotional support and left contact information if any needs arise.  [facility] will continue to monitor daily.    Provided updated [facility] medication list to pharmacy tech.  Please call with any hospice-related questions or concerns.  Thank you, Hessie Knows RN, BSN [facility] Liaison [phone]

## 2015-12-24 NOTE — ED Notes (Signed)
Pt resting quietly at this time

## 2015-12-24 NOTE — Telephone Encounter (Signed)
Return call to granddaughter [name].  She stated that patient has a large bed ulcer with a very foul odor for the past 1-2 weeks.  The ulcer is also draining.  They have told the hospice nurse, which she said the hospice nurse did not think it was infected.  She wanted to know if she should call EMS to transport patient to ED.  Precept with Dr. Gwendolyn Grant; ask who the hospice provider is and if they can do home visit for symptom management.  Dr. Doroteo Glassman is patient's PCP at [facility] and hospice provider.  Per 11/19/15 telephone note,  Dr. Doroteo Glassman agreed to have hospice providers provide care for symptom management.  Advised granddaughter to call the hospice nurse and inform her of what symptoms the patient is having; that the ulcer is large and has a foul odor.  Request if a hospice provider can make a home visit.  Offered an appointment for tomorrow at [facility]; however declined due to weakness of patient.  If hospice provider can't make home visit, then family would call EMS to transport patient to ED.  Will forward to PCP.  Clovis Pu, RN

## 2015-12-24 NOTE — ED Provider Notes (Signed)
CSN: [id number]     Arrival date & time 12/24/15  1143 History   First MD Initiated Contact with Patient 12/24/15 1147     Chief Complaint  Patient presents with  . Altered Mental Status     (Consider location/radiation/quality/duration/timing/severity/associated sxs/prior Treatment) Patient is a 82 y.o. female presenting with altered mental status. The history is provided by a relative.  Altered Mental Status Presenting symptoms: confusion and disorientation   Severity:  Severe Most recent episode:  More than 2 days ago Episode history:  Continuous Duration:  2 weeks Timing:  Constant Progression:  Worsening Chronicity:  New Associated symptoms: fever   Associated symptoms: no headaches, no nausea, no palpitations and no vomiting    82 yo F With a chief complaint of sacral wound and altered mental status. This been going on for about 2 weeks. Family states that the wound is been getting more more foul-smelling and having worse drainage. Patient complaining of pain to her low back as well. She's been having fevers and then her mental status changed a couple days ago. She is currently on home hospice care. Hospice nurses been evaluating her daily. They're concerned that she was not given any antibiotics and called 911 to come to the hospital. Level V caveat altered mental status. Patient is moaning in pain unable to obtain any history.  Past Medical History  Diagnosis Date  . Hypertension   . Dementia   . Hyperlipidemia   . Seizures (HCC)   . Metatarsal bone fracture     Right - base of 5th  . DVT (deep venous thrombosis) (HCC) 02/22/2013    02/18/2013: Left lower extremity (femoral and popliteal) subacute Treated with coumadin x 6-7 months    . Stroke (HCC)   . Trigeminal neuralgia 11/03/2006    Qualifier: Diagnosis of  By: Abundio Miu    . CATARACT 11/03/2006    Qualifier: Diagnosis of  By: Abundio Miu    . HEARING LOSS NOS OR DEAFNESS 11/03/2006    Qualifier:  Diagnosis of  By: Abundio Miu    . INCONTINENCE, URGE 11/03/2006    Qualifier: Diagnosis of  By: Abundio Miu    . Gangrene (HCC)   . Atrial fibrillation (HCC)   . Shortness of breath dyspnea    Past Surgical History  Procedure Laterality Date  . Toe surgery      right 5th toe amputation  . Amputation Right 10/26/2013    Procedure: AMPUTATION BELOW KNEE;  Surgeon: Nadara Mustard, MD;  Location: [facility] OR;  Service: Orthopedics;  Laterality: Right;  Right Below Knee Amputation  . Abdominal aortagram N/A 09/13/2013    Procedure: ABDOMINAL Ronny Flurry;  Surgeon: Fransisco Hertz, MD;  Location: [facility] CATH LAB;  Service: Cardiovascular;  Laterality: N/A;  . Below knee leg amputation Right 10/2013  . Amputation Left 11/11/2015    Procedure: AMPUTATION ABOVE KNEE;  Surgeon: Nadara Mustard, MD;  Location: [facility] OR;  Service: Orthopedics;  Laterality: Left;   Family History  Problem Relation Age of Onset  . Hypertension Mother   . Arthritis Father   . Heart disease Daughter     Heart Disease before age 25  . Hyperlipidemia Daughter   . Hypertension Daughter    Social History  Substance Use Topics  . Smoking status: Never Smoker   . Smokeless tobacco: Never Used  . Alcohol Use: No   OB History    No data available     Review of Systems  Constitutional: Positive  for fever. Negative for chills.  HENT: Negative for congestion and rhinorrhea.   Eyes: Negative for redness and visual disturbance.  Respiratory: Negative for shortness of breath and wheezing.   Cardiovascular: Negative for chest pain and palpitations.  Gastrointestinal: Negative for nausea and vomiting.  Genitourinary: Negative for dysuria and urgency.  Musculoskeletal: Negative for myalgias and arthralgias.  Skin: Positive for wound (sacral, worsening with drainage). Negative for pallor.  Neurological: Negative for dizziness and headaches.  Psychiatric/Behavioral: Positive for confusion.      Allergies  Lisinopril  Home  Medications   Prior to Admission medications   Medication Sig Start Date End Date Taking? Authorizing Provider  acetaminophen (TYLENOL) 325 MG tablet Take 2 tablets (650 mg total) by mouth every 8 (eight) hours as needed for mild pain or moderate pain. 11/19/15   Hillary Percell Boston, MD  amiodarone (PACERONE) 200 MG tablet TAKE 1 TABLET BY MOUTH TWICE DAILY 11/29/14   Pincus Large, DO  atorvastatin (LIPITOR) 20 MG tablet Take 1 tablet (20 mg total) by mouth daily at 6 PM. 11/19/15   Casey Burkitt, MD  feeding supplement, ENSURE ENLIVE, (ENSURE ENLIVE) LIQD Take 237 mLs by mouth 2 (two) times daily between meals. 11/19/15   Hillary Percell Boston, MD  ibuprofen (ADVIL,MOTRIN) 200 MG tablet Take 1 tablet (200 mg total) by mouth every 6 (six) hours as needed for mild pain or moderate pain. 11/19/15   Hillary Percell Boston, MD  levETIRAcetam (KEPPRA) 500 MG tablet TAKE 1 TABLET BY MOUTH 2 TIMES DAILY. 06/26/15   Pincus Large, DO  loratadine (CLARITIN) 5 MG/5ML syrup Take 10 mLs (10 mg total) by mouth daily as needed for itching. 05/15/15   Pincus Large, DO  LORazepam (ATIVAN) 0.5 MG tablet Take 1 tablet (0.5 mg total) by mouth 2 (two) times daily as needed for anxiety (agitation). 11/28/15   Pincus Large, DO  memantine (NAMENDA) 10 MG tablet Take 1 tablet (10 mg total) by mouth 2 (two) times daily. 08/12/15   Pincus Large, DO  methocarbamol (ROBAXIN) 500 MG tablet Take 1 tablet (500 mg total) by mouth every 6 (six) hours as needed for muscle spasms. 11/19/15   Hillary Percell Boston, MD  Multiple Vitamin (MULTIVITAMIN WITH MINERALS) TABS tablet Take 1 tablet by mouth daily. 11/19/15   Hillary Percell Boston, MD  ondansetron (ZOFRAN) 4 MG tablet Take 1 tablet (4 mg total) by mouth every 6 (six) hours as needed for nausea. 11/19/15   Hillary Percell Boston, MD  polyethylene glycol [facility] Ethelene Hal) packet Take 17 g by mouth daily as needed for moderate constipation.    Historical  Provider, MD  triamcinolone cream (KENALOG) 0.1 % Apply 1 application topically 2 (two) times daily. 03/19/15   Jamal Collin, MD  XARELTO 15 MG TABS tablet TAKE 1 TABLET BY MOUTH DAILY 10/31/15   Pincus Large, DO   BP 65/57 mmHg  Pulse 103  Temp(Src) 101.9 F (38.8 C) (Rectal)  Resp 40  Wt 140 lb (63.504 kg)  SpO2 100% Physical Exam  Constitutional: She is oriented to person, place, and time. She appears cachectic. She is uncooperative. She appears toxic. She appears ill. She appears distressed.  Chronically ill-appearing  HENT:  Head: Normocephalic and atraumatic.  Eyes: EOM are normal. Pupils are equal, round, and reactive to light.  Neck: Normal range of motion. Neck supple.  Cardiovascular: An irregularly irregular rhythm present. Tachycardia present.  Exam reveals no gallop and no friction rub.  No murmur heard. Pulmonary/Chest: Effort normal. She has no wheezes. She has no rales.  Abdominal: Soft. She exhibits no distension. There is tenderness (diffuse). There is no rebound and no guarding.  Musculoskeletal: She exhibits edema and tenderness.  Diffuse left upper extremity edema. Appears to be tender palpation throughout the arm. Large sacral ulcer with purulent drainage foul-smelling. Left AKA with purulent drainage in between sutures.  Neurological: She is alert and oriented to person, place, and time.  Skin: Skin is warm and dry. She is not diaphoretic.  Psychiatric: She has a normal mood and affect. Her behavior is normal.  Nursing note and vitals reviewed.    ED Course  Procedures (including critical care time) Labs Review Labs Reviewed  CULTURE, BLOOD (ROUTINE X 2)  CULTURE, BLOOD (ROUTINE X 2)  URINE CULTURE  COMPREHENSIVE METABOLIC PANEL  CBC WITH DIFFERENTIAL/PLATELET  URINALYSIS, ROUTINE W REFLEX MICROSCOPIC (NOT AT [facility])  BLOOD GAS, VENOUS  I-STAT BETA HCG BLOOD, ED ([facility], [facility], [facility] ONLY)  I-STAT CG4 LACTIC ACID, ED  I-STAT CHEM 8, ED    Imaging  Review No results found. I have personally reviewed and evaluated these images and lab results as part of my medical decision-making.   EKG Interpretation None      Procedure note: Ultrasound Guided Peripheral IV Ultrasound guided peripheral 1.88 inch angiocath IV placement performed by me. Indications: Nursing unable to place IV. Details: The antecubital fossa and upper arm were evaluated with a multifrequency linear probe. Patent brachial veins were noted. 1 attempt was made to cannulate a vein under realtime US guidance with successful cannulation of the vein and catheter placement. There is return of non-pulsatile dark red blood. The patient tolerated the procedure well without complications. Images archived electronically.  CPT codes: 19147 and [phone]    EMERGENCY DEPARTMENT [place] CARDIAC EXAM "Study: Limited Ultrasound of the heart and pericardium"  INDICATIONS:Hypotension and Unstable Vital Signs Multiple views of the heart and pericardium are obtained with a multi-frequency probe.  PERFORMED OZ:HYQMVH  IMAGES ARCHIVED?: Yes  FINDINGS: No pericardial effusion, Normal contractility and Tamponade physiology absent  LIMITATIONS:  Emergent procedure  VIEWS USED: Subcostal 4 chamber and Inferior Vena Cava  INTERPRETATION: Cardiac activity present, Pericardial effusioin absent, Cardiac tamponade absent and Probable elevated CVP  COMMENT:  IVC distended with no noted respiratory variation.    MDM   Final diagnoses:  Sepsis (HCC)    82 yo F with a chief complaint of altered mental status. Patient is febrile and tachycardic into the 130s with a blood pressure in the 70s. Code sepsis was initiated. We'll broadly cover with thank and Zosyn. Suspect that this may be due to the sacral ulcer that the patient also has significant amount of abdominal tenderness on exam. Will obtain a CT scan of the abdomen and pelvis.  Given 3L of fluid without improvement of BP.  [place] with sufficient  fluid.  Discussed with critical care.   CRITICAL CARE Performed by: Rae Roam   Total critical care time: 78 minutes  Critical care time was exclusive of separately billable procedures and treating other patients.  Critical care was necessary to treat or prevent imminent or life-threatening deterioration.  Critical care was time spent personally by me on the following activities: development of treatment plan with patient and/or surrogate as well as nursing, discussions with consultants, evaluation of patient's response to treatment, examination of patient, obtaining history from patient or surrogate, ordering and performing treatments and interventions, ordering and review of laboratory  studies, ordering and review of radiographic studies, pulse oximetry and re-evaluation of patient's condition.  The patients results and plan were reviewed and discussed.   Any x-rays performed were independently reviewed by myself.   Differential diagnosis were considered with the presenting HPI.  Medications  fentaNYL (SUBLIMAZE) injection 50 mcg (not administered)  norepinephrine (LEVOPHED) 4 mg in dextrose 5 % 250 mL (0.016 mg/mL) infusion (5 mcg/min Intravenous New Bag/Given 12/24/15 1414)  albuterol (PROVENTIL,VENTOLIN) solution continuous neb (10 mg/hr Nebulization New Bag/Given 12/24/15 1318)  sodium chloride 0.9 % bolus 2,000 mL (0 mLs Intravenous Stopped 12/24/15 1347)  vancomycin (VANCOCIN) IVPB 1000 mg/200 mL premix (0 mg Intravenous Stopped 12/24/15 1408)  piperacillin-tazobactam (ZOSYN) IVPB 3.375 g (0 g Intravenous Stopped 12/24/15 1408)  calcium gluconate 1 g in sodium chloride 0.9 % 100 mL IVPB (0 g Intravenous Stopped 12/24/15 1343)  insulin aspart (novoLOG) injection 10 Units (10 Units Intravenous Given 12/24/15 1353)  dextrose 50 % solution 50 mL (50 mLs Intravenous Given 12/24/15 1351)    Filed Vitals:   12/24/15 1335 12/24/15 1345 12/24/15 1405 12/24/15 1411  BP: 80/63 103/75  87/65 96/72  Pulse:    104  Temp:      TempSrc:      Resp:  27  20  Weight:      SpO2:    100%    Final diagnoses:  Sepsis (HCC)    Admission/ observation were discussed with the admitting physician, patient and/or family and they are comfortable with the plan.    Melene Plan, DO 12/24/15 1505

## 2015-12-24 NOTE — ED Notes (Signed)
Lab at bedside attempting labs.

## 2015-12-24 NOTE — ED Notes (Signed)
Dr. Adela Lank made aware BP is 78/66, has 500 cc of fluid remaining.

## 2015-12-24 NOTE — Progress Notes (Signed)
eLink Physician-Brief Progress Note Patient Name: Ann [name] DOB: 1927/12/29 MRN: [id number]   Date of Service  12/24/2015  HPI/Events of Note  Lactic Acid = 4.5 >> 7.5. Last EF 12/2014 = 50% to 55%.  eICU Interventions  Will order: 1. 0.9 NaCl 1 liter IV over 1 hour now.      Intervention Category Major Interventions: Acid-Base disturbance - evaluation and management  Bertrum Helmstetter Eugene 12/24/2015, 4:57 PM

## 2015-12-25 DIAGNOSIS — A419 Sepsis, unspecified organism: Secondary | ICD-10-CM | POA: Insufficient documentation

## 2015-12-25 LAB — POCT I-STAT, CHEM 8
BUN: 85 mg/dL — ABNORMAL HIGH (ref 6–20)
CHLORIDE: 118 mmol/L — AB (ref 101–111)
Calcium, Ion: 0.79 mmol/L — ABNORMAL LOW (ref 1.13–1.30)
Creatinine, Ser: 3.9 mg/dL — ABNORMAL HIGH (ref 0.44–1.00)
Glucose, Bld: 90 mg/dL (ref 65–99)
HEMATOCRIT: 29 % — AB (ref 36.0–46.0)
HEMOGLOBIN: 9.9 g/dL — AB (ref 12.0–15.0)
POTASSIUM: 7.7 mmol/L — AB (ref 3.5–5.1)
SODIUM: 151 mmol/L — AB (ref 135–145)
TCO2: 19 mmol/L (ref 0–100)

## 2015-12-25 LAB — CBC
HCT: 27.2 % — ABNORMAL LOW (ref 36.0–46.0)
HEMOGLOBIN: 8.6 g/dL — AB (ref 12.0–15.0)
MCH: 26.8 pg (ref 26.0–34.0)
MCHC: 31.6 g/dL (ref 30.0–36.0)
MCV: 84.7 fL (ref 78.0–100.0)
Platelets: 212 10*3/uL (ref 150–400)
RBC: 3.21 MIL/uL — ABNORMAL LOW (ref 3.87–5.11)
RDW: 18.6 % — ABNORMAL HIGH (ref 11.5–15.5)
WBC: 18.9 10*3/uL — ABNORMAL HIGH (ref 4.0–10.5)

## 2015-12-25 LAB — BASIC METABOLIC PANEL
Anion gap: 14 (ref 5–15)
BUN: 60 mg/dL — AB (ref 6–20)
CHLORIDE: 119 mmol/L — AB (ref 101–111)
CO2: 16 mmol/L — AB (ref 22–32)
CREATININE: 3.17 mg/dL — AB (ref 0.44–1.00)
Calcium: 6.9 mg/dL — ABNORMAL LOW (ref 8.9–10.3)
GFR calc Af Amer: 14 mL/min — ABNORMAL LOW (ref >60)
GFR calc non Af Amer: 12 mL/min — ABNORMAL LOW (ref >60)
GLUCOSE: 235 mg/dL — AB (ref 65–99)
Potassium: 4.1 mmol/L (ref 3.5–5.1)
SODIUM: 149 mmol/L — AB (ref 135–145)

## 2015-12-25 LAB — MRSA PCR SCREENING: MRSA by PCR: NEGATIVE

## 2015-12-25 LAB — GLUCOSE, CAPILLARY
GLUCOSE-CAPILLARY: 211 mg/dL — AB (ref 65–99)
GLUCOSE-CAPILLARY: 146 mg/dL — AB (ref 65–99)
Glucose-Capillary: 193 mg/dL — ABNORMAL HIGH (ref 65–99)

## 2015-12-25 LAB — VANCOMYCIN, RANDOM: Vancomycin Rm: 10 ug/mL

## 2015-12-25 LAB — MAGNESIUM: Magnesium: 1.5 mg/dL — ABNORMAL LOW (ref 1.7–2.4)

## 2015-12-25 MED ORDER — MAGNESIUM SULFATE 4 GM/100ML IV SOLN
4.0000 g | Freq: Once | INTRAVENOUS | Status: AC
  Administered 2015-12-25: 4 g via INTRAVENOUS
  Filled 2015-12-25: qty 100

## 2015-12-25 MED ORDER — CETYLPYRIDINIUM CHLORIDE 0.05 % MT LIQD
7.0000 mL | Freq: Two times a day (BID) | OROMUCOSAL | Status: DC
  Administered 2015-12-25 – 2015-12-26 (×2): 7 mL via OROMUCOSAL

## 2015-12-25 MED ORDER — LACTATED RINGERS IV BOLUS (SEPSIS)
1000.0000 mL | Freq: Once | INTRAVENOUS | Status: AC
  Administered 2015-12-25: 1000 mL via INTRAVENOUS

## 2015-12-25 MED ORDER — DAKINS (1/4 STRENGTH) 0.125 % EX SOLN
Freq: Two times a day (BID) | CUTANEOUS | Status: DC
  Administered 2015-12-25: 1
  Administered 2015-12-26: 02:00:00
  Filled 2015-12-25: qty 473

## 2015-12-25 MED ORDER — VANCOMYCIN HCL 10 G IV SOLR
1250.0000 mg | Freq: Once | INTRAVENOUS | Status: AC
  Administered 2015-12-25: 1250 mg via INTRAVENOUS
  Filled 2015-12-25: qty 1250

## 2015-12-25 MED ORDER — CHLORHEXIDINE GLUCONATE 0.12 % MT SOLN: 15.0000 mL | Freq: Two times a day (BID) | OROMUCOSAL | Status: DC

## 2015-12-25 MED ORDER — SODIUM CHLORIDE 0.9 % IV BOLUS (SEPSIS)
1000.0000 mL | Freq: Once | INTRAVENOUS | Status: AC
  Administered 2015-12-25: 1000 mL via INTRAVENOUS

## 2015-12-25 MED ORDER — VANCOMYCIN HCL IN DEXTROSE 1-5 GM/200ML-% IV SOLN
1000.0000 mg | INTRAVENOUS | Status: DC
  Administered 2015-12-26: 1000 mg via INTRAVENOUS
  Filled 2015-12-25 (×3): qty 200

## 2015-12-25 MED ORDER — SODIUM CHLORIDE 0.45 % IV SOLN
INTRAVENOUS | Status: DC
  Administered 2015-12-25 – 2015-12-26 (×3): via INTRAVENOUS

## 2015-12-25 MED ORDER — HALOPERIDOL LACTATE 5 MG/ML IJ SOLN
3.0000 mg | Freq: Four times a day (QID) | INTRAMUSCULAR | Status: DC
  Administered 2015-12-25 – 2015-12-26 (×7): 3 mg via INTRAVENOUS
  Filled 2015-12-25 (×3): qty 1
  Filled 2015-12-25 (×2): qty 0.6
  Filled 2015-12-25 (×3): qty 1
  Filled 2015-12-25 (×2): qty 0.6

## 2015-12-25 MED ORDER — MORPHINE SULFATE (PF) 2 MG/ML IV SOLN
1.0000 mg | INTRAVENOUS | Status: DC | PRN
  Administered 2015-12-25 – 2015-12-26 (×5): 1 mg via INTRAVENOUS
  Filled 2015-12-25 (×5): qty 1

## 2015-12-25 MED ORDER — CETYLPYRIDINIUM CHLORIDE 0.05 % MT LIQD: 7.0000 mL | Freq: Two times a day (BID) | OROMUCOSAL | Status: DC

## 2015-12-25 MED ORDER — SODIUM CHLORIDE 0.9 % IV BOLUS (SEPSIS)
500.0000 mL | Freq: Once | INTRAVENOUS | Status: AC
  Administered 2015-12-25: 500 mL via INTRAVENOUS

## 2015-12-25 NOTE — Progress Notes (Addendum)
Pharmacy Antibiotic Note  Ann [name] is a 82 y.o. female admitted on 12/24/2015 with sepsis.  Pharmacy has been consulted for vancomycin and zosyn dosing. Pt presents with AKI. UOP 0.2 mL/kg/hr and slowly improving per RN. SCr 3.73>3.17. VR today 10 and below goal (~24hr from LD of 1g)  Pt received vancomycin 1g and zosyn 3.375g IV once in the ED.  Plan: Vancomycin IV 1250 mg x 1, then Vancomycin 1g q18h  Zosyn 2.25g IV q8h Monitor culture data, renal function and clinical course VT at SS prn  Weight: 127 lb 10.3 oz (57.9 kg)  Temp (24hrs), Avg:97 F (36.1 C), Min:95.9 F (35.5 C), Max:97.7 F (36.5 C)   Recent Labs Lab 12/24/15 1242 12/24/15 1243 12/24/15 1244 12/24/15 1356 12/24/15 1603 12/24/15 1625 12/25/15 0250 12/25/15 1400  WBC  --   --  17.6*  --   --   --  18.9*  --   CREATININE 3.90*  3.90*  --   --  HEMOLYSIS AT THIS LEVEL MAY AFFECT RESULT 3.73*  --  3.17*  --   LATICACIDVEN  --  4.45*  --   --   --  7.53*  --   --   VANCORANDOM  --   --   --   --   --   --   --  10    Estimated Creatinine Clearance: 10.6 mL/min (by C-G formula based on Cr of 3.17).    Allergies  Allergen Reactions  . Lisinopril Other (See Comments)    Possible Angioedema on 11/16/13 admission.    Antimicrobials this admission: Vanc 4/19 >>  Zosyn 4/19 >>   Microbiology results: 4/19 BCx: ngtd  UCx: ngtd  MRSA PCR: neg  Sandi Carne, PharmD Pharmacy Resident Pager: [phone]  12/25/2015 2:47 PM

## 2015-12-25 NOTE — Progress Notes (Signed)
Called daughter and granddaughter to inform them that their family member is moving to [phone]. Left voicemail. Suzy Bouchard E, [place] 12/25/2015 2245

## 2015-12-25 NOTE — H&P (Signed)
PULMONARY / CRITICAL CARE MEDICINE   Name: Ann [name] MRN: [id number] DOB: 1928/03/09    ADMISSION DATE:  12/24/2015 CONSULTATION DATE:  12/24/15  REFERRING MD:  Dr. Adela Lank  CHIEF COMPLAINT:  Shock, AMS  HISTORY OF PRESENT ILLNESS:   Ann [name] is a 82 year old female with PMH of HTN, Dementia, HLD, seizures, DVT (LLE not currently on [facility]), stroke, PAF, and PAD s/p bilateral AKA secondary to gangrene. She presented to [facility] ED by EMS for concern of altered mental status and foul smelling wound - left AKA wound and sacral wound for 2 weeks.  She currently is cared for at home by her granddaughter Ann [name]) and daughter Ann [name]) She was discharged home as a DNI/DNR with Hospice on 11/18/15 after left AKA in the setting of dry gangrene requiring a wound vac in the hospital. Family at that time insisted that she come home instead of rehab at a SNF (as she had previously been to rehab and did not tolerate it well). She does have a history of dementia and normally recognizes her family and knows of her surrounding until the last 2 weeks.  Family reports she has developed a sacral ulcer since being discharged home that has progressed to draining and now foul smelling; additionally reports she has not been eating, with fevers and altered mental status for the last two weeks. With EMS, she was hypotensive 98/palp, HR 139, RR 36 treated with 1L NS. In the ED, she was noted to have a fever 101.9, HR 110, RR 40, 100% RA, and continued hypotension 65/57. Workup notable for istat chem 8 of Na 151, K 7.7, Cl 118, Bun 85, Cr 3.90 (prior Cr 1.05 11/19/15), glucose 90, H/H 9.9/29.0, lactic acid 4.45, Venous gas 7.443/31.3/35/21.4, WBC 17.6, plts 270. She was treated with additional 2 L NS, calcium gluconate 1gm, insulin, D50, Albuterol, vancomycin, and zosyn. CXR no acute inflitrate. She remained hypotensive after adequate fluid resuscitation and therefore levophed started. She has very poor venous  access, repeat labs to re-evaluate hyperkalemia have been hemolyzed. She has been lethargic in ED but able to answer some questions. She has remained a DNI/DNR however family wants infection treated if reversible. PCCM consulted for septic shock.   Subjective:  Low dose neo required  VITAL SIGNS: BP 82/66 mmHg  Pulse 89  Temp(Src) 95.9 F (35.5 C) (Core (Comment))  Resp 18  Wt 57.9 kg (127 lb 10.3 oz)  SpO2 96%  HEMODYNAMICS:    VENTILATOR SETTINGS:    INTAKE / OUTPUT: I/O last 3 completed shifts: In: 5646 [I.V.:5441; IV Piggyback:205] Out: 370 [Urine:370]  PHYSICAL EXAMINATION: General: Ill-appearing elderly female, no resp distress Neuro: Eyes open to pain, not fc HEENT:drey, jvd low Cardiovascular: s1 s2 rrr murmur unchanged Lungs: Clear, diminished in the bases, non-labored breathing Abdomen: Soft, round, +BS Musculoskeletal: bilateral LE AKA's, no acute deformities  Skin: cool extremities/dry, 3+pitting edema LUE, bilateral AKA, wound to left distal/medial stump with areas of yellow drainage, foul odor from sacral wound  LABS:  BMET  Recent Labs Lab 12/24/15 1356 12/24/15 1603 12/25/15 0250  NA HEMOLYSIS AT THIS LEVEL MAY AFFECT RESULT 152* 149*  K HEMOLYSIS AT THIS LEVEL MAY AFFECT RESULT 4.0 4.1  CL HEMOLYSIS AT THIS LEVEL MAY AFFECT RESULT 119* 119*  CO2 HEMOLYSIS AT THIS LEVEL MAY AFFECT RESULT 15* 16*  BUN HEMOLYSIS AT THIS LEVEL MAY AFFECT RESULT 65* 60*  CREATININE HEMOLYSIS AT THIS LEVEL MAY AFFECT RESULT 3.73* 3.17*  GLUCOSE HEMOLYSIS  AT THIS LEVEL MAY AFFECT RESULT 177* 235*    Electrolytes  Recent Labs Lab 12/24/15 1356 12/24/15 1603 12/25/15 0250  CALCIUM HEMOLYSIS AT THIS LEVEL MAY AFFECT RESULT 7.4* 6.9*  MG  --   --  1.5*    CBC  Recent Labs Lab 12/24/15 1242 12/24/15 1244 12/25/15 0250  WBC  --  17.6* 18.9*  HGB 9.9* 8.8* 8.6*  HCT 29.0* 27.7* 27.2*  PLT  --  278 212    Coag's No results for input(s): APTT,  INR in the last 168 hours.  Sepsis Markers  Recent Labs Lab 12/24/15 1243 12/24/15 1625  LATICACIDVEN 4.45* 7.53*    ABG No results for input(s): PHART, PCO2ART, PO2ART in the last 168 hours.  Liver Enzymes  Recent Labs Lab 12/24/15 1356 12/24/15 1603  AST HEMOLYSIS AT THIS LEVEL MAY AFFECT RESULT 264*  ALT HEMOLYSIS AT THIS LEVEL MAY AFFECT RESULT 54  ALKPHOS HEMOLYSIS AT THIS LEVEL MAY AFFECT RESULT 61  BILITOT HEMOLYSIS AT THIS LEVEL MAY AFFECT RESULT 3.2*  ALBUMIN HEMOLYSIS AT THIS LEVEL MAY AFFECT RESULT 1.5*    Cardiac Enzymes No results for input(s): TROPONINI, PROBNP in the last 168 hours.  Glucose  Recent Labs Lab 12/24/15 1509 12/24/15 2123 12/25/15 0722  GLUCAP 150* 166* 193*    Imaging Ct Abdomen Pelvis Wo Contrast  12/24/2015  CLINICAL DATA:  82 year old female with abdominal pain. Decubitus ulcer. Fever of 101. Renal insufficiency precludes IV contrast. Initial encounter. EXAM: CT ABDOMEN AND PELVIS WITHOUT CONTRAST TECHNIQUE: Multidetector CT imaging of the abdomen and pelvis was performed following the standard protocol without IV contrast. COMPARISON:  Right hip CT 11/08/2013. FINDINGS: Cardiomegaly. No pericardial effusion. Trace layering right pleural effusion. Confluent dependent pulmonary opacity in the right lung. Minor dependent atelectasis in the left lung. Osteopenia. Widespread degenerative changes with areas of interbody and posterior element ankylosis in the spine. There is bilateral SI joint ankylosis. There is a decubitus wound overlying the S4-S5 sacral levels (series 2, image 68) with a small volume of subcutaneous gas. There is associated soft tissue thickening at the sacrococcygeal junction (series 2, image 72) without an organized or drainable fluid collection. There is no strong CT evidence of sacral osteomyelitis. Moderate presacral stranding. Stool ball in the rectum. No pelvic free fluid. Negative uterus and adnexa. Diminutive urinary  bladder. Numerous pelvic phleboliths. Small fat containing inguinal hernias. Redundant sigmoid colon with retained stool. Decompressed left colon. Decompressed splenic flexure. Much of the transverse colon is decompressed. Negative right colon aside from retained stool. Negative terminal ileum. No dilated small bowel. Decompressed stomach and duodenum. No abdominal free air or free fluid identified. Vicarious contrast excretion to the gallbladder. Hyperdense but otherwise negative noncontrast appearance of the liver. Noncontrast spleen, pancreas and adrenal glands are within normal limits. Negative noncontrast kidneys ectatic abdominal aorta and iliac arteries with widespread calcified atherosclerosis. No discrete abdominal aortic aneurysm. No lymphadenopathy. IMPRESSION: 1. Sacral decubitus wound with associated presacral stranding, but no drainable fluid collection or abscess abscess. And no CT evidence of sacral osteomyelitis at this time. 2. Trace layering right pleural effusion. Confluent right lower lobe pulmonary opacity might reflect severe atelectasis but developing right lower lobe pneumonia is difficult to exclude. 3. Cardiomegaly. No pericardial effusion. Hyperdensity in the liver might relate to amiodarone therapy. 4. Vicarious contrast excretion to the gallbladder. 5. Widespread aortic ectasia and calcified atherosclerosis. Electronically Signed   By: Odessa Fleming M.D.   On: 12/24/2015 14:47   Dg Chest Port 1  View  12/24/2015  CLINICAL DATA:  Sepsis EXAM: PORTABLE CHEST 1 VIEW COMPARISON:  December 29, 2014 FINDINGS: There is stable bilateral midlung atelectatic change. There is no edema or consolidation. Heart is enlarged with pulmonary vascular within normal limits. Aorta is prominent, stable. Bones are somewhat osteoporotic. There is degenerative change in each shoulder. IMPRESSION: Stable midlung atelectatic change bilaterally. No edema or consolidation. Stable cardiac silhouette. Electronically Signed    By: Bretta Bang III M.D.   On: 12/24/2015 12:22   Dg Femur Port Min 2 Views Left  12/24/2015  CLINICAL DATA:  Amputation site with drainage EXAM: LEFT FEMUR PORTABLE 2 VIEWS COMPARISON:  None. FINDINGS: Frontal and attempted lateral views obtained. The patient has had amputation at the mid femoral level. There is no acute fracture or dislocation. There is no demonstrable erosive change or bony destruction. There is no soft tissue air or obvious ulceration along the stump. There is extensive arterial vascular calcification. There is mild narrowing of the left hip joint. IMPRESSION: No bony destruction appreciable. No fracture or dislocation. No demonstrable soft tissue abscess. There is extensive arterial vascular calcification. Electronically Signed   By: Bretta Bang III M.D.   On: 12/24/2015 12:46   STUDIES:  CT ABD/Pelvis 4/19 >> sacral decubitus with associated presacral stranding, no drainable fluid collection or abscess, no evidence of sacral osteo, trace layering R pleural effusion, RLL consolidation, cardiomegaly Femur XR 4/19 >>  CULTURES: UC 4/19 >>  BC 4/19>>>  ANTIBIOTICS: Vanco 4/19 >>  Zosyn 4/19 >>   SIGNIFICANT EVENTS: 4/19  Admit with shock, failure to thrive.  Concern for sacral wound / BKA source 4/20 neo  DISCUSSION: 82 y/o F with PMH of dementia, peripheral vascular disease, recent (11/2015) BKA of L lower extremity on hospice admitted with concern for septic shock with suspected wound sources.  Family reports picture of failure to thrive.  DNR/DNI.  Brief vasopressor support - if no improvement, full comfort care.    ASSESSMENT / PLAN:   CARDIOVASCULAR A:  Septic Shock - suspected sacral / stump wounds hypovolemia P:  IVF as below See ID  DNR/DNI NO central line placement Neosynephrine via PIV for MAP >50, per family wanted short term 24 hrs support, bolus, wean neo to off, then no restart Add stress roids empiric Bolus sys 80  acceptable  RENAL A:   AKI - in setting of volume depletion / poor intake Hyperkalemia - s/p medical Rx in ER, difficulty with obtaining labs Hypernatremic  P:   Follow am bmet  Mag supp IVF @ 71ml/hr, to 1/2 NS No role lactic repeat  GASTROINTESTINAL A:   Protein Calorie Malnutrition / Failure to Thrive P:   Comfort feeding as pt tolerates as more awake  HEMATOLOGIC A:   Hx AF / DVT - previously on xarelto, recently stopped by Hospice  LUE Swelling - present since prior admission in March P:  Monitor intermittent CBC Heparin SQ for DVT prophylaxis  Discussed no [place] assessment of LUE with family as she is not a candidate for anticoagulation   INFECTIOUS A:   Sacral Wound - dark areas concerning for eschar Septic Shock - suspect wounds as source R/o Osteomyelitis - no sacral osteo seen on imaging  P:   Continue empiric ABX  likely to dc vanc in am if culture neg No abscess on CT Wound care  NEUROLOGIC A:   Hx Seizure Disorder, Dementia, Chronic Pain (wounds) enceph on top , NA up, infection P:   RASS  goal: n/a PRN rectal tylenol for pain Continue home haldol solution  PRN ativan for anxiety  PRN morphine for pain If distress add morphine drip  FAMILY  - Updates: Family updated at bedside - daughter Ann [name]), granddaughter, grandson.  They are followed by hospice at home.  She has been an established DNR.  However, they have grown increasingly concerned regarding her sacral wounds.  They are interested in IVF's, antibiotics and brief vasopressor support (discussed 24-48 hrs) with the hope of improvement but that if she did not show improvement that we would progress to full comfort.  Priority is comfort.  DNR/DNI, no defib or ACLS.  She is followed by [facility].   They are aware of her admission and had recently been discussing [facility] admission.  The family stated they "wanted to feel like they had done everything".    -  Inter-disciplinary family meet or Palliative Care meeting due by:  12/25/15  Ccm time 30 min   Mcarthur Rossetti. Tyson Alias, MD, FACP Pgr: [phone] [facility]

## 2015-12-25 NOTE — Progress Notes (Signed)
Nutrition Brief Note  Chart reviewed due to low Braden score. Patient with sacral and left AKA site wounds.  Patient to receive IVF's, antibiotics, and brief vasopressor support with the hope of improvement, but if she does not show improvement will progress to full comfort.Patient is followed by Hospice at home.  No nutrition interventions warranted at this time.  Please consult as needed.   Joaquin Courts, RD, LDN, CNSC Pager [phone] After Hours Pager [phone]

## 2015-12-25 NOTE — Consult Note (Signed)
WOC wound consult note Reason for Consult:Sacral (Unstageable) pressure ulcer and open wounds (full thickness) along left AKA amputation site. Wound type:Pressure and surgical Pressure Ulcer POA: Yes Measurement: Sacral Unstageable Pressure Ulcer:  7cm x 13cm.  Left AKA stump:  Three open areas, lateral measures 1cm round x 0.1cm red, dry; central measures 0.4cm x 1.2cm x 0.2cm with thin movable yellow tissue partially obscuring wound bed; medial measures 1cm x 2cm and the wound bed is 100% obscured by the presence of yellow necrotic slough.  There is a moderate amount of dried serous to light yellow exudate on the pillowcase beneath this stump at the time of my assessment. Wound bed:100% black, soft eschar.  Fluctuant. Malodorous, but consistent with that of necrotic tissue Drainage (amount, consistency, odor) Small amount of tam malodorous exudate escaping from areas of loosening eschar Periwound:Intact, dry Dressing procedure/placement/frequency: I today implemented conservative orders for twice daily Dakin's solution (.012 percent) to the necrotic, malodorous and fluctuant Unstageable pressure injury on the sacrum. These orders are only for 3 days. Further, I suggest consideration of consultation with CCS to see if conservative bedside debridement is possible to allow for drainage of accumulating exudate and/or gas. If you agree, this can be accompanied and followed by a POC that includes hydrotherapy administered by Physical Therapy with continuing conservative sharp debridements.  If less aggressive therapy is desired, I suggest continuing the twice daily Dakin's solution for odor control or implement cleansing with Dakin Solution and add painting the eschar with betadine swabsticks to dry and stabilize the eschar. Turning and repositioning is in place and will continue.  If this patient transfers out of the ICU and to a medical/surgical floor, please consider a pressure redistribution mattress  replacement. WOC nursing team will not follow, but will remain available to this patient, the nursing and medical teams.  Please re-consult if needed. Thanks, Ladona Mow, MSN, RN, GNP, [place]  Pager# [phone]

## 2015-12-25 NOTE — Progress Notes (Signed)
MC -[id number]  [facility] RN Liason Visit  This is a GIP- related covered admission of 12/24/15 per Dr. Barbee Shropshire with a [facility] diagnosis of alzheimer's dementia.   Code status is DNR  Patient presented to [facility] ED on 4/19 due to draining sacral wound, altered mental status with fever.    Visited patient.   Staff RN just completed Dressing changes. She has had a wound consult done with new orders for dressing changes.  Patient moaning with pain.   She is not eating ; eyes open but does not respond verbally.   Foley catheter draining cloudy urine.   She is receiving IVF and IV Phenylephrine.  She is hypotensive.   The patient has required multiple doses of IV morphine 1mg  to manage pain - prn dose increased from q4h to q2h prn.     No family present at time of visit.   TC to [facility] SW ; made aware of patient's poor status. She will be contacting the patient's daughter.   [facility] will continue to follow patient to anticipate discharge needs. Please call with any questions.   Lavone Neri, RN,  [facility]  [phone]

## 2015-12-25 NOTE — Progress Notes (Signed)
[facility] Social Work note LCSW arrived on unit to find patient resting in and no family present. LCSW called daughter, [name] who shared plans to come to see patient in the next hour or so. She has no transportation and is awaiting family to come get her or to take a cab. She shared understanding of patient poor status and allowing patient "to go". She shared that her daughter, [name] was struggling with patient decline and nearing death. LCSW offered support and reassurance.  LCSW spoke with RN, Lauren regarding patient status, psychosocial issues.  LCSW to continue to follow and offer support.  Orson Gear, [place]  [phone]

## 2015-12-26 LAB — URINE CULTURE: Culture: NO GROWTH

## 2015-12-26 MED ORDER — MORPHINE SULFATE (PF) 2 MG/ML IV SOLN: 1.0000 mg | INTRAVENOUS | Status: DC | PRN

## 2015-12-26 MED ORDER — GLYCOPYRROLATE 0.2 MG/ML IJ SOLN: 0.2000 mg | INTRAMUSCULAR | Status: DC | PRN

## 2015-12-26 MED ORDER — ONDANSETRON 4 MG PO TBDP: 4.0000 mg | ORAL_TABLET | Freq: Four times a day (QID) | ORAL | Status: AC | PRN

## 2015-12-26 MED ORDER — ACETAMINOPHEN 650 MG RE SUPP: 650.0000 mg | RECTAL | Status: AC | PRN

## 2015-12-26 MED ORDER — SODIUM CHLORIDE 0.9 % IV SOLN: 500.0000 mg | Freq: Two times a day (BID) | INTRAVENOUS | Status: AC

## 2015-12-26 MED ORDER — MORPHINE SULFATE 25 MG/ML IV SOLN
1.0000 mg/h | INTRAVENOUS | Status: DC
  Administered 2015-12-26 (×2): 1 mg/h via INTRAVENOUS
  Filled 2015-12-26: qty 10

## 2015-12-26 MED ORDER — ONDANSETRON 4 MG PO TBDP: 4.0000 mg | ORAL_TABLET | Freq: Four times a day (QID) | ORAL | Status: DC | PRN

## 2015-12-26 MED ORDER — ONDANSETRON HCL 4 MG/2ML IJ SOLN: 4.0000 mg | Freq: Four times a day (QID) | INTRAMUSCULAR | Status: DC | PRN

## 2015-12-26 MED ORDER — GLYCOPYRROLATE 1 MG PO TABS
1.0000 mg | ORAL_TABLET | ORAL | Status: DC | PRN
  Filled 2015-12-26: qty 1

## 2015-12-26 MED ORDER — DEXTROSE 5 % IV SOLN: 1.0000 mg/h | INTRAVENOUS | Status: DC

## 2015-12-26 MED ORDER — MORPHINE SULFATE 25 MG/ML IV SOLN: 1.0000 mg/h | INTRAVENOUS | Status: DC

## 2015-12-26 MED ORDER — HALOPERIDOL LACTATE 5 MG/ML IJ SOLN: 3.0000 mg | Freq: Four times a day (QID) | INTRAMUSCULAR | Status: AC

## 2015-12-26 MED ORDER — BIOTENE DRY MOUTH MT LIQD: 15.0000 mL | OROMUCOSAL | Status: AC | PRN

## 2015-12-26 MED ORDER — GLYCOPYRROLATE 0.2 MG/ML IJ SOLN: 0.2000 mg | INTRAMUSCULAR | Status: AC | PRN

## 2015-12-26 MED ORDER — POLYVINYL ALCOHOL 1.4 % OP SOLN: 1.0000 [drp] | Freq: Four times a day (QID) | OPHTHALMIC | Status: AC | PRN

## 2015-12-26 MED ORDER — GLYCOPYRROLATE 1 MG PO TABS: 1.0000 mg | ORAL_TABLET | ORAL | Status: AC | PRN

## 2015-12-26 MED ORDER — POLYVINYL ALCOHOL 1.4 % OP SOLN
1.0000 [drp] | Freq: Four times a day (QID) | OPHTHALMIC | Status: DC | PRN
  Filled 2015-12-26: qty 15

## 2015-12-26 MED ORDER — BIOTENE DRY MOUTH MT LIQD: 15.0000 mL | OROMUCOSAL | Status: DC | PRN

## 2015-12-26 MED ORDER — ONDANSETRON HCL 4 MG/2ML IJ SOLN: 4.0000 mg | Freq: Four times a day (QID) | INTRAMUSCULAR | Status: AC | PRN

## 2015-12-26 NOTE — Progress Notes (Signed)
[facility] Medicine Teaching Service Daily Progress Note Intern Pager: [phone]  Patient name: Ann [name] Medical record number: [id number] Date of birth: 02-10-1928 Age: 82 y.o. Gender: female  Primary Care Provider: Caryl Ada, DO Consultants: CCM (signed off), hospice Code Status: DNR  Pt Overview and Major Events to Date:  4/19 - admitted to [facility] 4/20 - transitioned to comfort care; transferred to [name]  Assessment and Plan: [name] is an 82 yo F presenting with altered mental status. PMH of HTN, dementia, HLD, seizures, DVT, stroke, PAF, and PAD s/p bilateral AKA secondary to gangrene.   Septic Shock - likely 2/2 sacral/stump wounds. Initially receiving aggressive treatment through CCM, including vasopressors, but after no improvement, transitioned to comfort care. Blood culture NGx1d. Now persistently hypotensive (BP 64-88/44-58 overnight). Afebrile.  - DNR/DNI, full comfort care - Wound care - Haldol q6, Ativan BID - Morphine drip  - Morphine 1mg  q2 PRN - Zofran 4mg  q6 PRN - Still on vanc/zosyn - will discuss with Ann whether continuation of antibiotic treatment is desired - Still on MIVF - will discuss with Ann whether to continue fluids  History of a.fib/DVT - previously on Xarelto, recently stopped by Hospice. Also with LUE swelling present since March. Ann agrees with no workup of LUE swelling as patient is not candidate for anticoagulation and is now comfort care.  - D/c heparin as patient now comfort care  History of seizure disorder - On Keppra at home.  - Continue Keppra   FEN/GI: 1/2NS@125  mL/hr, soft diet PPx: none (comfort care)  Disposition: comfort care  Subjective:  Patient remains hypotensive now that vasopressors have been discontinued.   Objective: Temp:  [95.7 F (35.4 C)-97.4 F (36.3 C)] 97.4 F (36.3 C) (04/21 0533) Pulse Rate:  [88-95] 88 (04/21 0533) Resp:  [11-30] 17 (04/21 0533) BP: (64-96)/(44-77) 88/44 mmHg (04/21  0533) SpO2:  [90 %-98 %] 92 % (04/21 0533) Physical Exam: General: lying in bed, crying intermittently, ill-appearing Cardiovascular: RRR, no murmurs appreciated Respiratory: exam limited due to patient effort; CTAB in anterior lung fields, non-labored Abdomen: soft, non-tender, non-distended, +BS Neuro: opens eyes to name and pain  Laboratory:  Recent Labs Lab 12/24/15 1242 12/24/15 1244 12/25/15 0250  WBC  --  17.6* 18.9*  HGB 9.9*  9.9* 8.8* 8.6*  HCT 29.0*  29.0* 27.7* 27.2*  PLT  --  278 212    Recent Labs Lab 12/24/15 1356 12/24/15 1603 12/25/15 0250  NA HEMOLYSIS AT THIS LEVEL MAY AFFECT RESULT 152* 149*  K HEMOLYSIS AT THIS LEVEL MAY AFFECT RESULT 4.0 4.1  CL HEMOLYSIS AT THIS LEVEL MAY AFFECT RESULT 119* 119*  CO2 HEMOLYSIS AT THIS LEVEL MAY AFFECT RESULT 15* 16*  BUN HEMOLYSIS AT THIS LEVEL MAY AFFECT RESULT 65* 60*  CREATININE HEMOLYSIS AT THIS LEVEL MAY AFFECT RESULT 3.73* 3.17*  CALCIUM HEMOLYSIS AT THIS LEVEL MAY AFFECT RESULT 7.4* 6.9*  PROT HEMOLYSIS AT THIS LEVEL MAY AFFECT RESULT 5.8*  --   BILITOT HEMOLYSIS AT THIS LEVEL MAY AFFECT RESULT 3.2*  --   ALKPHOS HEMOLYSIS AT THIS LEVEL MAY AFFECT RESULT 61  --   ALT HEMOLYSIS AT THIS LEVEL MAY AFFECT RESULT 54  --   AST HEMOLYSIS AT THIS LEVEL MAY AFFECT RESULT 264*  --   GLUCOSE HEMOLYSIS AT THIS LEVEL MAY AFFECT RESULT 177* 235*    Imaging/Diagnostic Tests: Ct Abdomen Pelvis Wo Contrast 12/24/2015  CLINICAL DATA:  82 year old female with abdominal pain. Decubitus ulcer. Fever of 101. Renal insufficiency  precludes IV contrast. Initial encounter. EXAM: CT ABDOMEN AND PELVIS WITHOUT CONTRAST. IMPRESSION: 1. Sacral decubitus wound with associated presacral stranding, but no drainable fluid collection or abscess abscess. And no CT evidence of sacral osteomyelitis at this time. 2. Trace layering right pleural effusion. Confluent right lower lobe pulmonary opacity might reflect severe atelectasis but developing  right lower lobe pneumonia is difficult to exclude. 3. Cardiomegaly. No pericardial effusion. Hyperdensity in the liver might relate to amiodarone therapy. 4. Vicarious contrast excretion to the gallbladder. 5. Widespread aortic ectasia and calcified atherosclerosis.  Dg Chest Port 1 View 12/24/2015  CLINICAL DATA:  Sepsis EXAM: PORTABLE CHEST 1 VIEW. IMPRESSION: Stable midlung atelectatic change bilaterally. No edema or consolidation. Stable cardiac silhouette.   Dg Femur Port Min 2 Views Left 12/24/2015  CLINICAL DATA:  Amputation site with drainage EXAM: LEFT FEMUR PORTABLE 2 VIEWS. IMPRESSION: No bony destruction appreciable. No fracture or dislocation. No demonstrable soft tissue abscess. There is extensive arterial vascular calcification.   Marquette Saa, MD 12/26/2015, 9:30 AM PGY-1, [facility] Ann [name] Intern pager: [phone], text pages welcome

## 2015-12-26 NOTE — Discharge Summary (Signed)
Family Medicine Teaching [facility] Discharge Summary  Patient name: Ann [name] Medical record number: [id number] Date of birth: 1927/10/14 Age: 82 y.o. Gender: female Date of Admission: 12/24/2015  Date of Discharge: 12/26/2015 Admitting Physician: Leslye Peer, MD  Primary Care Provider: Caryl Ada, DO Consultants: CCM  Indication for Hospitalization: altered mental status   Discharge Diagnoses/Problem List:  Patient Active Problem List   Diagnosis Date Noted  . Sepsis (HCC)   . Septic shock (HCC) 12/24/2015  . Altered mental state   . Arterial hypotension   . Palliative care encounter   . Constipation   . S/P AKA (above knee amputation) unilateral (HCC)   . Acute delirium   . Status post above knee amputation of left lower extremity (HCC)   . Pressure ulcer 11/11/2015  . Foot pain   . UTI (lower urinary tract infection)   . Alzheimer's disease   . Arterial occlusion (HCC) 11/10/2015  . Healthcare maintenance 05/20/2015  . Paroxysmal a-fib (HCC) 03/20/2015  . PAD (peripheral artery disease) (HCC) 03/20/2015  . Rash and nonspecific skin eruption 03/19/2015  . Dementia without behavioral disturbance 03/05/2015  . Genital symptoms, female 05/17/2014  . Chronic systolic CHF (congestive heart failure) (HCC) 02/08/2014  . Swallowing difficulty 12/13/2013  . Moderate malnutrition (HCC) 11/20/2013  . S/P BKA (below knee amputation) unilateral (HCC) 10/26/2013  . Shoulder mass 10/07/2011  . Keratosis of plantar aspect of foot 12/23/2010  . BACK PAIN 12/13/2008  . BUNIONS, BILATERAL 08/06/2008  . HYPERCHOLESTEROLEMIA 11/03/2006  . DEMENTIA, NOT SPECIFIED 11/03/2006  . CATARACT 11/03/2006  . HEARING LOSS NOS OR DEAFNESS 11/03/2006  . HYPERTENSION, BENIGN SYSTEMIC 11/03/2006  . CVA 11/03/2006  . GASTROESOPHAGEAL REFLUX, NO ESOPHAGITIS 11/03/2006  . Convulsions/seizures (HCC) 11/03/2006  . INCONTINENCE, URGE 11/03/2006   Disposition:  General: lying in bed, crying  intermittently, ill-appearing Cardiovascular: RRR, no murmurs appreciated Respiratory: exam limited due to patient effort; CTAB in anterior lung fields, non-labored Abdomen: soft, non-tender, non-distended, +BS Neuro: opens eyes to name and pain  Discharge Condition: hospice  Discharge Exam:  General: lying in bed, crying intermittently, ill-appearing Cardiovascular: RRR, no murmurs appreciated Respiratory: exam limited due to patient effort; CTAB in anterior lung fields, non-labored Abdomen: soft, non-tender, non-distended, +BS Neuro: opens eyes to name and pain  Brief Hospital Course:  Patient admitted with altered mental status. Found to be in septic shock.   Septic shock, likely 2/2 sacral and stump wounds Patient with altered mental status on arrival and meeting criteria for septic shock. Thought likely 2/2 to sacral and stump wound infections. Patient's family initially desired aggressive treatment, so patient was admitted to critical care service. As patient was hypotensive, she was started on vasopressors. She was also started on vanc/zosyn. Patient did not improve after ~24 hours of aggressive treatment, so family decided to transition treatment to comfort care. Vasopressors, antibiotics, and MIVF were discontinued. Patient appeared to be in pain, so was started on morphine drip.   Issues for Follow Up:  1. Despite comfort care, patient continued on home Keppra for history of seizure disorder.  2. Patient's anticoagulation for history of a.fib/DVT discontinued when made comfort care.   Significant Procedures: none  Significant Labs and Imaging:   Recent Labs Lab 12/24/15 1242 12/24/15 1244 12/25/15 0250  WBC  --  17.6* 18.9*  HGB 9.9*  9.9* 8.8* 8.6*  HCT 29.0*  29.0* 27.7* 27.2*  PLT  --  278 212    Recent Labs Lab 12/24/15 1242 12/24/15 1356  12/24/15 1603 12/25/15 0250  NA 151*  151* HEMOLYSIS AT THIS LEVEL MAY AFFECT RESULT 152* 149*  K 7.7*  7.7*  HEMOLYSIS AT THIS LEVEL MAY AFFECT RESULT 4.0 4.1  CL 118*  118* HEMOLYSIS AT THIS LEVEL MAY AFFECT RESULT 119* 119*  CO2  --  HEMOLYSIS AT THIS LEVEL MAY AFFECT RESULT 15* 16*  GLUCOSE 90  90 HEMOLYSIS AT THIS LEVEL MAY AFFECT RESULT 177* 235*  BUN 85*  85* HEMOLYSIS AT THIS LEVEL MAY AFFECT RESULT 65* 60*  CREATININE 3.90*  3.90* HEMOLYSIS AT THIS LEVEL MAY AFFECT RESULT 3.73* 3.17*  CALCIUM  --  HEMOLYSIS AT THIS LEVEL MAY AFFECT RESULT 7.4* 6.9*  MG  --   --   --  1.5*  ALKPHOS  --  HEMOLYSIS AT THIS LEVEL MAY AFFECT RESULT 61  --   AST  --  HEMOLYSIS AT THIS LEVEL MAY AFFECT RESULT 264*  --   ALT  --  HEMOLYSIS AT THIS LEVEL MAY AFFECT RESULT 54  --   ALBUMIN  --  HEMOLYSIS AT THIS LEVEL MAY AFFECT RESULT 1.5*  --    Results/Tests Pending at Time of Discharge: blood culture (NGx1d at time of discharge)   Discharge Medications:    Medication List    ASK your doctor about these medications        acetaminophen 325 MG tablet  Commonly known as:  TYLENOL  Take 2 tablets (650 mg total) by mouth every 8 (eight) hours as needed for mild pain or moderate pain.     amiodarone 200 MG tablet  Commonly known as:  PACERONE  TAKE 1 TABLET BY MOUTH TWICE DAILY     amLODipine 10 MG tablet  Commonly known as:  NORVASC  Take 10 mg by mouth daily.     atorvastatin 20 MG tablet  Commonly known as:  LIPITOR  Take 1 tablet (20 mg total) by mouth daily at 6 PM.     carvedilol 3.125 MG tablet  Commonly known as:  COREG  Take 3.125 mg by mouth 2 (two) times daily with a meal.     feeding supplement (ENSURE ENLIVE) Liqd  Take 237 mLs by mouth 2 (two) times daily between meals.     haloperidol 2 MG/ML solution  Commonly known as:  HALDOL  Take 4 mg by mouth every 6 (six) hours.     ibuprofen 200 MG tablet  Commonly known as:  ADVIL,MOTRIN  Take 1 tablet (200 mg total) by mouth every 6 (six) hours as needed for mild pain or moderate pain.     levETIRAcetam 500 MG tablet  Commonly  known as:  KEPPRA  TAKE 1 TABLET BY MOUTH 2 TIMES DAILY.     loratadine 5 MG/5ML syrup  Commonly known as:  CLARITIN  Take 10 mLs (10 mg total) by mouth daily as needed for itching.     LORazepam 0.5 MG tablet  Commonly known as:  ATIVAN  Take 1 tablet (0.5 mg total) by mouth 2 (two) times daily as needed for anxiety (agitation).     memantine 10 MG tablet  Commonly known as:  NAMENDA  Take 1 tablet (10 mg total) by mouth 2 (two) times daily.     methocarbamol 500 MG tablet  Commonly known as:  ROBAXIN  Take 1 tablet (500 mg total) by mouth every 6 (six) hours as needed for muscle spasms.     morphine 20 MG/ML concentrated solution  Commonly known as:  ROXANOL  Take 5 mg by mouth every 4 (four) hours as needed for moderate pain or severe pain.     multivitamin with minerals Tabs tablet  Take 1 tablet by mouth daily.     ondansetron 4 MG tablet  Commonly known as:  ZOFRAN  Take 1 tablet (4 mg total) by mouth every 6 (six) hours as needed for nausea.     polyethylene glycol packet  Commonly known as:  MIRALAX / GLYCOLAX  Take 17 g by mouth daily as needed for mild constipation.     sennosides-docusate sodium 8.6-50 MG tablet  Commonly known as:  SENOKOT-S  Take 2 tablets by mouth at bedtime.     sorbitol 70 % solution  Take 30 mLs by mouth daily as needed (constipation).     triamcinolone cream 0.1 %  Commonly known as:  KENALOG  Apply 1 application topically 2 (two) times daily.     XARELTO 15 MG Tabs tablet  Generic drug:  Rivaroxaban  TAKE 1 TABLET BY MOUTH DAILY        Discharge Instructions: Please refer to Patient Instructions section of EMR for full details.  Patient was counseled important signs and symptoms that should prompt return to medical care, changes in medications, dietary instructions, activity restrictions, and follow up appointments.     Marquette Saa, MD 12/26/2015, 12:29 PM PGY-1, [facility]

## 2015-12-26 NOTE — Care Management Important Message (Signed)
Important Message  Patient Details  Name: Ann [name] MRN: [id number] Date of Birth: 1928/04/19   Medicare Important Message Given:  Yes    Felma Pfefferle P Estella Malatesta 12/26/2015, 1:37 PM

## 2015-12-26 NOTE — Progress Notes (Signed)
[facility] RN note   Met with daughter Florian Buff .  She has accepted available bed at [facility].    [facility] SW Ollen Gross currently meeting  with daughter to complete registration paperwork.    Notifed SW Randall Hiss of plan to transfer patient today.      Please fax discharge summary to [phone] when available.  Please arrange transport to [facility] today as soon as possible  -   Send signed DNR with patient.   RN please call report to [phone]     Please call with any questions.     Thank you!   Mickie Kay, [facility]  [phone]

## 2015-12-26 NOTE — Progress Notes (Signed)
Report called to Carol at [facility]. 

## 2015-12-26 NOTE — Progress Notes (Addendum)
[facility] -6N-[facility] RN Liason Visit  This is a GIP- related covered admission of 12/24/15 per Dr. Barbee Shropshire with a [facility] diagnosis of alzheimer's dementia. Code status is DNR  Patient moved to 6N from 65M.  Admitted with  Sepsis .   Patient resting. Minimally responsive.   She  Is receiving IV morphine 1mg  continuous and 1mg   prn for pan and has received two doses in the past 24 hours.    The Neo-synephrine has been discontinued.  BP 88/40.    She is on 02 and is receiving IV antibiotic ( Vancomycin).  Patient has an unstageable sacral wound as well as non-healing Left AKA stump wound requiring dressing changes.   She has a foley catheter in place.   Poor po intake.    [facility] SW was contacted and will follow up with daughter regarding possible [facility] transfer pending bed availability.     [facility] will continue to follow and anticipate discharge needs.   Please call with any questions.    Lavone Neri, RN,  [facility]  [phone]

## 2015-12-26 NOTE — Progress Notes (Signed)
Wasted 245mg  morphine. Romeo Apple Bhuhu witnessed waste.

## 2015-12-26 NOTE — Clinical Social Work Note (Signed)
Patient to be d/c'ed today to [facility].  Patient and family agreeable to plans will transport via ems RN to call report to [phone].  Leeandra Ellerson, MSW, LCSWA [phone]  

## 2015-12-29 LAB — CULTURE, BLOOD (ROUTINE X 2)
Culture: NO GROWTH
Culture: NO GROWTH

## 2015-12-31 ENCOUNTER — Telehealth: Payer: Self-pay | Admitting: Obstetrics and Gynecology

## 2015-12-31 NOTE — Telephone Encounter (Signed)
Death certificate dropped off by funeral home. Will place in Dr. Lavone Neri box, who is covering Dr. Doroteo Glassman this week. Please return to me once completed.

## 2015-12-31 NOTE — Telephone Encounter (Signed)
Form filled out and given to Tia.  Katina Degree. Jimmey Ralph, MD [facility] Family Medicine Resident PGY-2 12/31/2015 3:38 PM

## 2016-01-05 DEATH — deceased

## 2017-01-29 IMAGING — CT CT ABD-PELV W/O CM
2 of 4 series · 16 of 46 positions shown, 18 images · non-contrast
Comparison: Right hip CT 11/08/2013.

CLINICAL DATA: 87-year-old female with abdominal pain. Decubitus
ulcer. Fever of 101. Renal insufficiency precludes IV contrast.
Initial encounter.

EXAM:
CT ABDOMEN AND PELVIS WITHOUT CONTRAST
TECHNIQUE: Multidetector CT imaging of the abdomen and pelvis was performed
following the standard protocol without IV contrast.

[Series 2: a/p w/o 5mm · axial · non-contrast · 0.70mm/px · z∈[+926,+1331]mm · 13 of 89 slices shown, 15 images]
[im 4/89  soft-tissue]
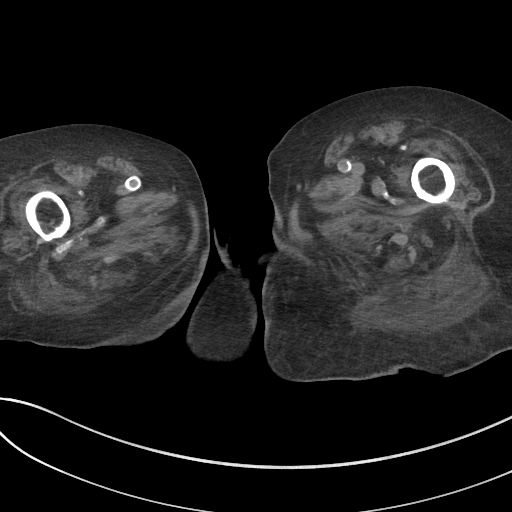
[im 4/89  bone]
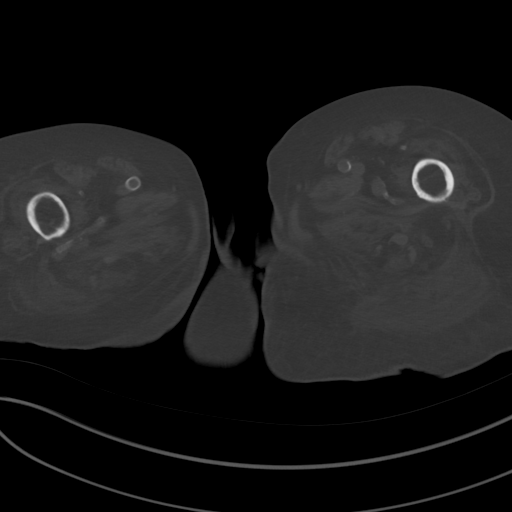
[im 11/89  soft-tissue]
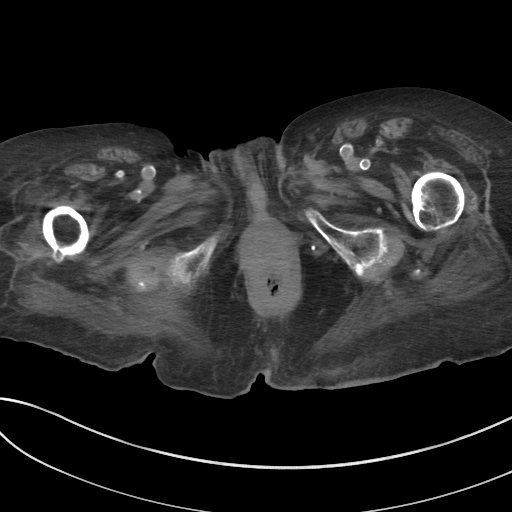
[im 18/89  soft-tissue]
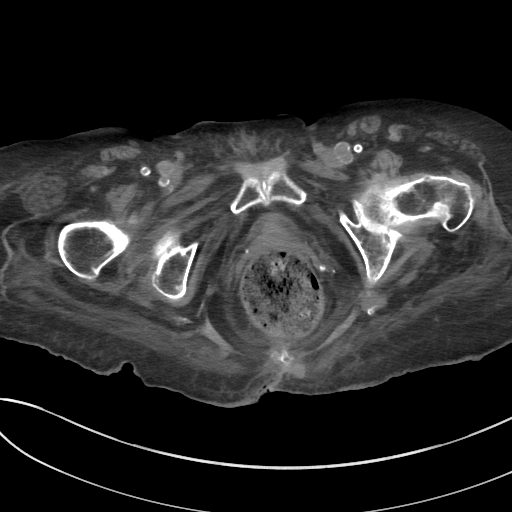
[im 25/89  soft-tissue]
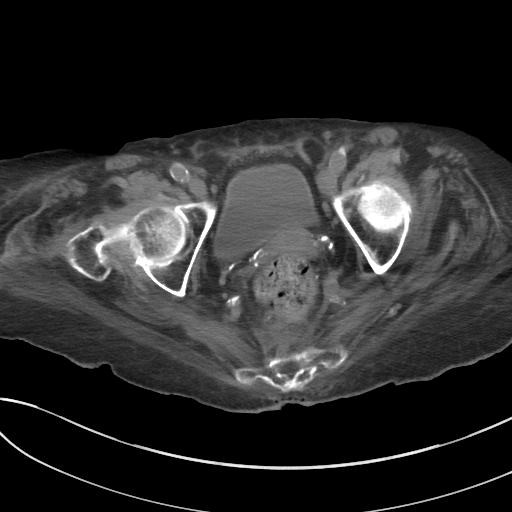
[im 32/89  soft-tissue]
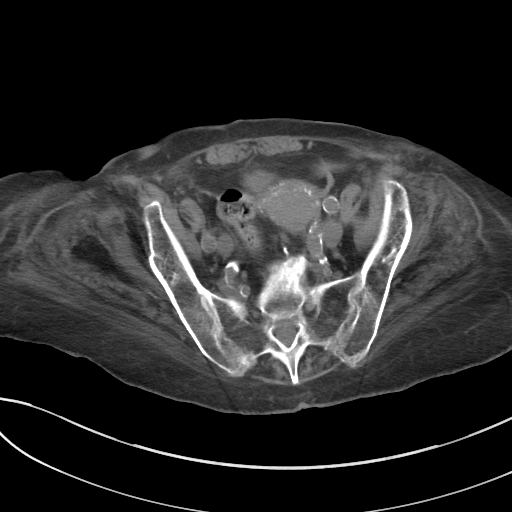
[im 39/89  soft-tissue]
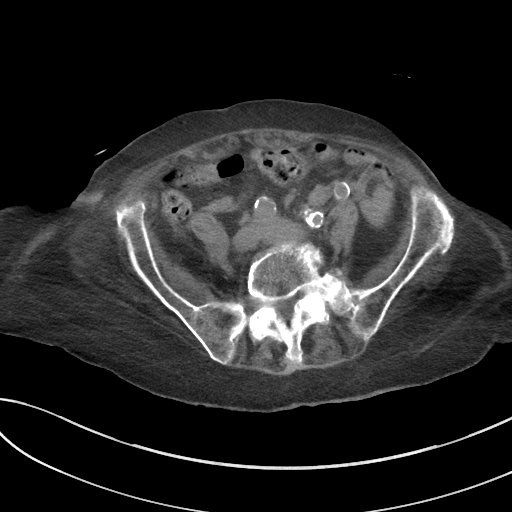
[im 46/89  soft-tissue]
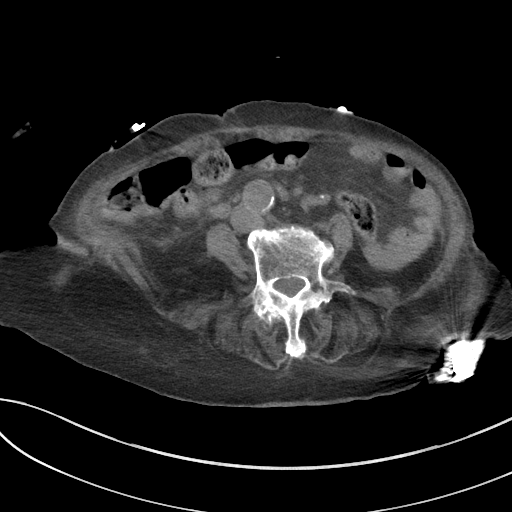
[im 50/89  soft-tissue]
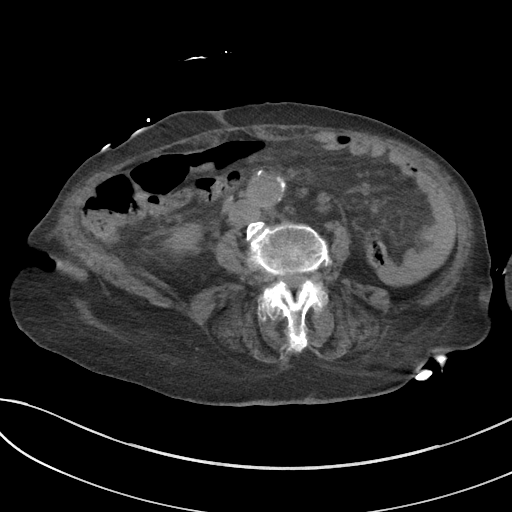
[im 57/89  soft-tissue]
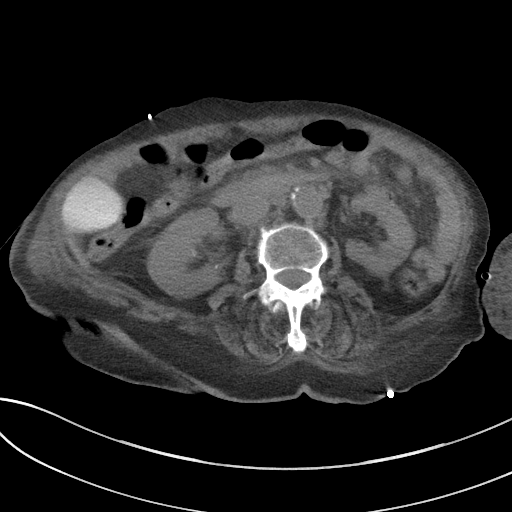
[im 57/89  bone]
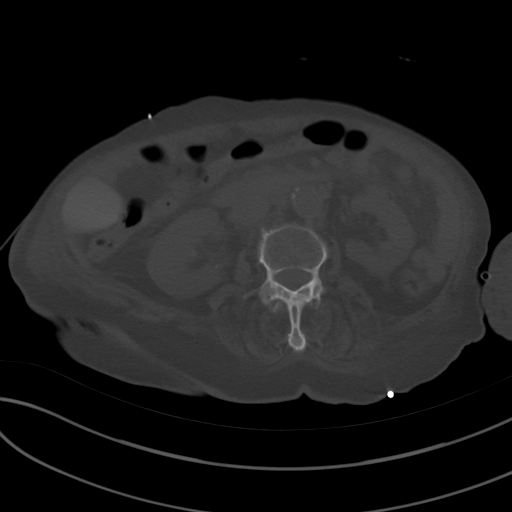
[im 64/89  soft-tissue]
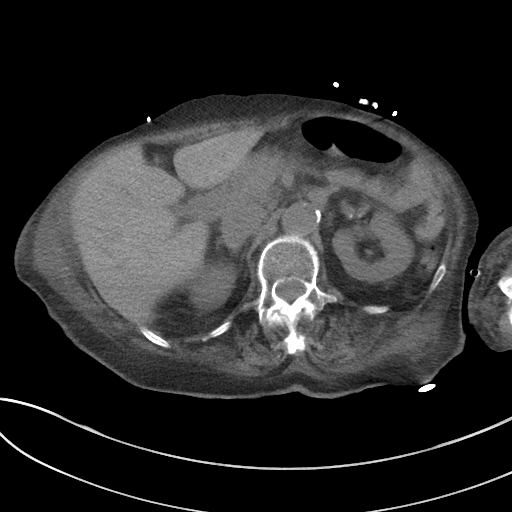
[im 71/89  soft-tissue]
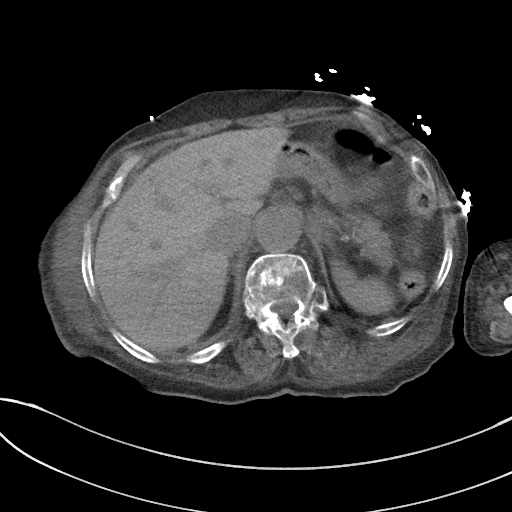
[im 78/89  soft-tissue]
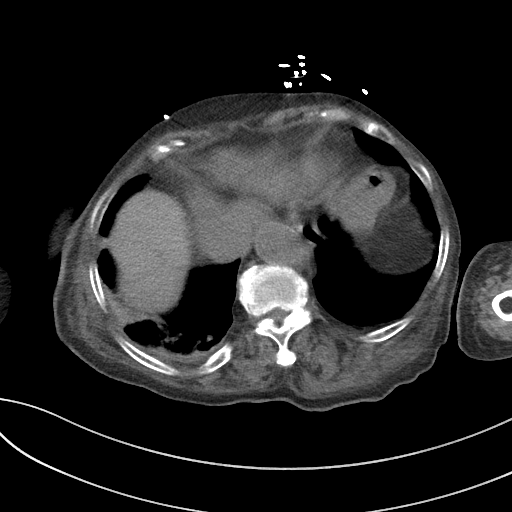
[im 85/89  soft-tissue]
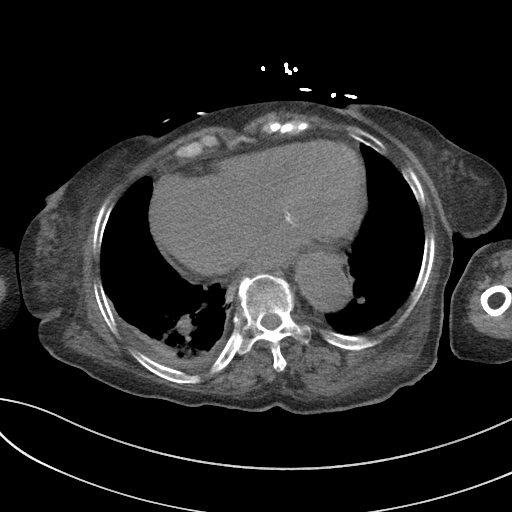

[Series 5: a/p w/o cor · coronal · non-contrast · 0.76mm/px · 3 of 113 slices shown]
[im 38/113  soft-tissue]
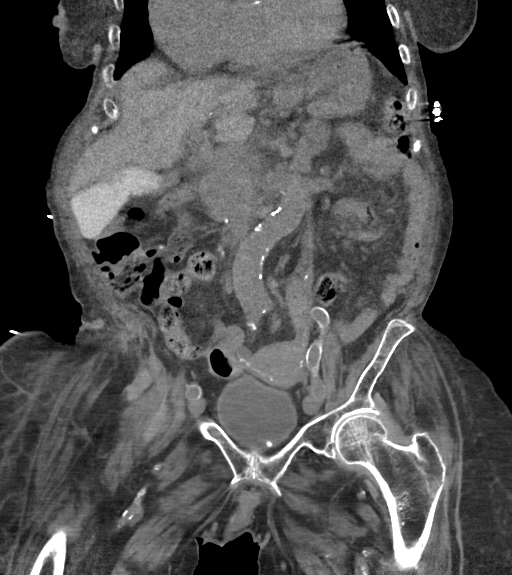
[im 50/113  soft-tissue]
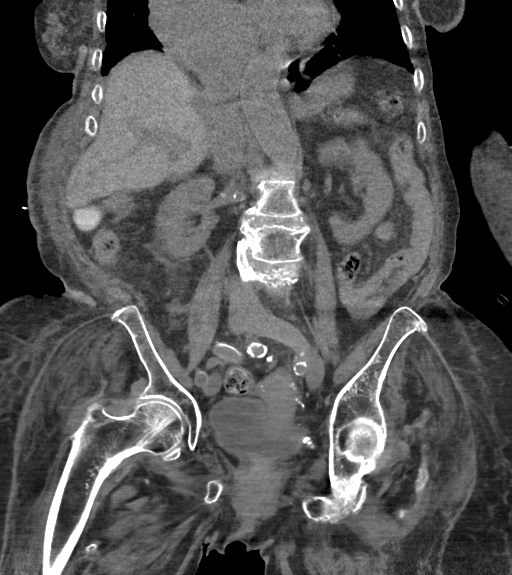
[im 63/113  soft-tissue]
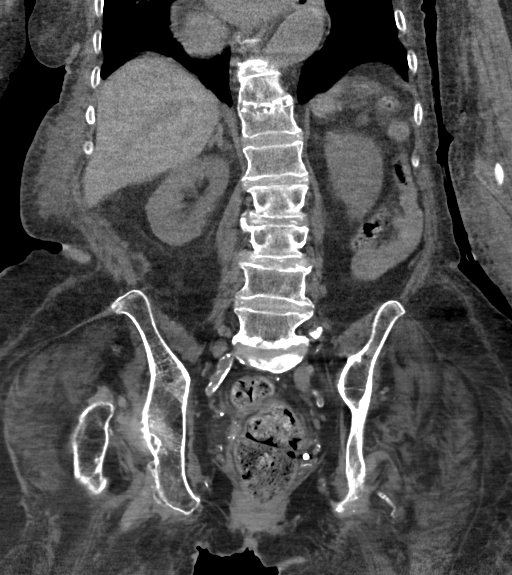

[16 of 46 positions shown; findings below may reference images not displayed]

FINDINGS: Cardiomegaly. No pericardial effusion. Trace layering right pleural
effusion. Confluent dependent pulmonary opacity in the right lung.
Minor dependent atelectasis in the left lung.

Osteopenia. Widespread degenerative changes with areas of interbody
and posterior element ankylosis in the spine. There is bilateral SI
joint ankylosis. There is a decubitus wound overlying the S4-S5
sacral levels (series 2, image 68) with a small volume of
subcutaneous gas. There is associated soft tissue thickening at the
sacrococcygeal junction (series 2, image 72) without an organized or
drainable fluid collection. There is no strong CT evidence of sacral
osteomyelitis.

Moderate presacral stranding. Stool ball in the rectum. No pelvic
free fluid. Negative uterus and adnexa. Diminutive urinary bladder.
Numerous pelvic phleboliths. Small fat containing inguinal hernias.

Redundant sigmoid colon with retained stool. Decompressed left
colon. Decompressed splenic flexure. Much of the transverse colon is
decompressed. Negative right colon aside from retained stool.
Negative terminal ileum. No dilated small bowel. Decompressed
stomach and duodenum.

No abdominal free air or free fluid identified. Vicarious contrast
excretion to the gallbladder. Hyperdense but otherwise negative
noncontrast appearance of the liver. Noncontrast spleen, pancreas
and adrenal glands are within normal limits. Negative noncontrast
kidneys ectatic abdominal aorta and iliac arteries with widespread
calcified atherosclerosis. No discrete abdominal aortic aneurysm. No
lymphadenopathy.
IMPRESSION: 1. Sacral decubitus wound with associated presacral stranding, but
no drainable fluid collection or abscess abscess. And no CT evidence
of sacral osteomyelitis at this time.
2. Trace layering right pleural effusion. Confluent right lower lobe
pulmonary opacity might reflect severe atelectasis but developing
right lower lobe pneumonia is difficult to exclude.
3. Cardiomegaly. No pericardial effusion. Hyperdensity in the liver
might relate to amiodarone therapy.
4. Vicarious contrast excretion to the gallbladder.
5. Widespread aortic ectasia and calcified atherosclerosis.
